# Patient Record
Sex: Male | Born: 1975 | Race: White | Hispanic: No | Smoking: Current every day smoker
Health system: Southern US, Community
[De-identification: ages and names within clinical notes are randomized; demographics above are authoritative.]

## PROBLEM LIST (undated history)

## (undated) DIAGNOSIS — I10 Essential (primary) hypertension: Secondary | ICD-10-CM

## (undated) DIAGNOSIS — F329 Major depressive disorder, single episode, unspecified: Secondary | ICD-10-CM

## (undated) DIAGNOSIS — F32A Depression, unspecified: Secondary | ICD-10-CM

## (undated) DIAGNOSIS — R4589 Other symptoms and signs involving emotional state: Secondary | ICD-10-CM

## (undated) DIAGNOSIS — R4689 Other symptoms and signs involving appearance and behavior: Secondary | ICD-10-CM

## (undated) HISTORY — DX: Essential (primary) hypertension: I10

---

## 2014-11-01 ENCOUNTER — Encounter (HOSPITAL_COMMUNITY): Payer: Self-pay | Admitting: *Deleted

## 2014-11-01 ENCOUNTER — Emergency Department (HOSPITAL_COMMUNITY)
Admission: EM | Admit: 2014-11-01 | Discharge: 2014-11-02 | Disposition: A | Discharge status: Other Acute Inpatient Hospital | Payer: BLUE CROSS/BLUE SHIELD | Attending: Emergency Medicine | Admitting: Emergency Medicine

## 2014-11-01 DIAGNOSIS — R45851 Suicidal ideations: Secondary | ICD-10-CM | POA: Insufficient documentation

## 2014-11-01 DIAGNOSIS — Z87891 Personal history of nicotine dependence: Secondary | ICD-10-CM | POA: Insufficient documentation

## 2014-11-01 DIAGNOSIS — R1011 Right upper quadrant pain: Secondary | ICD-10-CM | POA: Insufficient documentation

## 2014-11-01 DIAGNOSIS — F32A Depression, unspecified: Secondary | ICD-10-CM

## 2014-11-01 DIAGNOSIS — F329 Major depressive disorder, single episode, unspecified: Secondary | ICD-10-CM

## 2014-11-01 HISTORY — DX: Other symptoms and signs involving appearance and behavior: R46.89

## 2014-11-01 HISTORY — DX: Other symptoms and signs involving emotional state: R45.89

## 2014-11-01 HISTORY — DX: Depression, unspecified: F32.A

## 2014-11-01 HISTORY — DX: Major depressive disorder, single episode, unspecified: F32.9

## 2014-11-01 LAB — CBC WITH DIFFERENTIAL/PLATELET
BASOS PCT: 2 % — AB (ref 0–1)
Basophils Absolute: 0.1 10*3/uL (ref 0.0–0.1)
EOS ABS: 0.1 10*3/uL (ref 0.0–0.7)
Eosinophils Relative: 1 % (ref 0–5)
HCT: 46.9 % (ref 39.0–52.0)
HEMOGLOBIN: 16.4 g/dL (ref 13.0–17.0)
Lymphocytes Relative: 40 % (ref 12–46)
Lymphs Abs: 2.8 10*3/uL (ref 0.7–4.0)
MCH: 32.3 pg (ref 26.0–34.0)
MCHC: 35 g/dL (ref 30.0–36.0)
MCV: 92.5 fL (ref 78.0–100.0)
Monocytes Absolute: 0.6 10*3/uL (ref 0.1–1.0)
Monocytes Relative: 9 % (ref 3–12)
Neutro Abs: 3.4 10*3/uL (ref 1.7–7.7)
Neutrophils Relative %: 48 % (ref 43–77)
PLATELETS: 306 10*3/uL (ref 150–400)
RBC: 5.07 MIL/uL (ref 4.22–5.81)
RDW: 14.7 % (ref 11.5–15.5)
WBC: 7 10*3/uL (ref 4.0–10.5)

## 2014-11-01 LAB — URINALYSIS, ROUTINE W REFLEX MICROSCOPIC
Bilirubin Urine: NEGATIVE
Glucose, UA: NEGATIVE mg/dL
HGB URINE DIPSTICK: NEGATIVE
Ketones, ur: NEGATIVE mg/dL
Leukocytes, UA: NEGATIVE
NITRITE: NEGATIVE
Protein, ur: 30 mg/dL — AB
SPECIFIC GRAVITY, URINE: 1.016 (ref 1.005–1.030)
UROBILINOGEN UA: 0.2 mg/dL (ref 0.0–1.0)
pH: 7 (ref 5.0–8.0)

## 2014-11-01 LAB — LIPASE, BLOOD: LIPASE: 27 U/L (ref 11–59)

## 2014-11-01 LAB — COMPREHENSIVE METABOLIC PANEL
ALT: 128 U/L — ABNORMAL HIGH (ref 0–53)
ANION GAP: 12 (ref 5–15)
AST: 137 U/L — ABNORMAL HIGH (ref 0–37)
Albumin: 4.3 g/dL (ref 3.5–5.2)
Alkaline Phosphatase: 77 U/L (ref 39–117)
BILIRUBIN TOTAL: 0.4 mg/dL (ref 0.3–1.2)
BUN: <5 mg/dL — ABNORMAL LOW (ref 6–23)
CALCIUM: 8.7 mg/dL (ref 8.4–10.5)
CO2: 26 mmol/L (ref 19–32)
Chloride: 103 mEq/L (ref 96–112)
Creatinine, Ser: 0.89 mg/dL (ref 0.50–1.35)
Glucose, Bld: 99 mg/dL (ref 70–99)
POTASSIUM: 3.7 mmol/L (ref 3.5–5.1)
SODIUM: 141 mmol/L (ref 135–145)
Total Protein: 8 g/dL (ref 6.0–8.3)

## 2014-11-01 LAB — URINE MICROSCOPIC-ADD ON

## 2014-11-01 LAB — RAPID URINE DRUG SCREEN, HOSP PERFORMED
AMPHETAMINES: NOT DETECTED
BENZODIAZEPINES: NOT DETECTED
Barbiturates: NOT DETECTED
Cocaine: NOT DETECTED
OPIATES: NOT DETECTED
Tetrahydrocannabinol: POSITIVE — AB

## 2014-11-01 LAB — ETHANOL: ALCOHOL ETHYL (B): 244 mg/dL — AB (ref 0–9)

## 2014-11-01 LAB — ACETAMINOPHEN LEVEL

## 2014-11-01 LAB — SALICYLATE LEVEL: Salicylate Lvl: <4 mg/dL (ref 2.8–20.0)

## 2014-11-01 MED ORDER — ZOLPIDEM TARTRATE 5 MG PO TABS: 5.0000 mg | ORAL_TABLET | Freq: Every evening | ORAL | Status: DC | PRN

## 2014-11-01 MED ORDER — LORAZEPAM 1 MG PO TABS
1.0000 mg | ORAL_TABLET | Freq: Once | ORAL | Status: AC
  Administered 2014-11-01: 1 mg via ORAL
  Filled 2014-11-01: qty 1

## 2014-11-01 MED ORDER — MIRTAZAPINE 30 MG PO TABS
30.0000 mg | ORAL_TABLET | Freq: Every day | ORAL | Status: DC | PRN
  Filled 2014-11-01: qty 1

## 2014-11-01 MED ORDER — IBUPROFEN 200 MG PO TABS
600.0000 mg | ORAL_TABLET | Freq: Three times a day (TID) | ORAL | Status: DC | PRN
  Administered 2014-11-01 – 2014-11-02 (×2): 600 mg via ORAL
  Filled 2014-11-01 (×4): qty 1

## 2014-11-01 MED ORDER — GABAPENTIN 300 MG PO CAPS
300.0000 mg | ORAL_CAPSULE | Freq: Three times a day (TID) | ORAL | Status: DC
  Administered 2014-11-01 – 2014-11-02 (×2): 300 mg via ORAL
  Filled 2014-11-01 (×2): qty 1

## 2014-11-01 MED ORDER — VITAMIN B-1 100 MG PO TABS
100.0000 mg | ORAL_TABLET | Freq: Every day | ORAL | Status: DC
  Administered 2014-11-01 – 2014-11-02 (×2): 100 mg via ORAL
  Filled 2014-11-01 (×2): qty 1

## 2014-11-01 MED ORDER — THIAMINE HCL 100 MG/ML IJ SOLN: 100.0000 mg | Freq: Every day | INTRAMUSCULAR | Status: DC

## 2014-11-01 MED ORDER — DULOXETINE HCL 60 MG PO CPEP
60.0000 mg | ORAL_CAPSULE | Freq: Every day | ORAL | Status: DC
  Administered 2014-11-02: 60 mg via ORAL
  Filled 2014-11-01: qty 1

## 2014-11-01 MED ORDER — ONDANSETRON HCL 4 MG PO TABS: 4.0000 mg | ORAL_TABLET | Freq: Three times a day (TID) | ORAL | Status: DC | PRN

## 2014-11-01 MED ORDER — LORAZEPAM 1 MG PO TABS: 1.0000 mg | ORAL_TABLET | ORAL | Status: DC | PRN

## 2014-11-01 NOTE — ED Notes (Signed)
TTS at bedside. 

## 2014-11-01 NOTE — ED Notes (Addendum)
Battling with severe depression; suicidal thoughts: wants to overdose on pills, cut wrists. Feels terrible, hopeless, poor self image. Is being treated for depression. C/o lt lateral side of abd. And lower back pain. Etoh. Last drink on 10/31/14

## 2014-11-01 NOTE — BH Assessment (Addendum)
Tele Assessment Note   Alan Becker is an 39 y.o. male.  -Clinician talked to Campbell Lerner, PA regarding need of TTS consult.  Pt has SI and has been drinking daily since separation from wife 6 months ago.  Patient said that he does have thoughts of killing himself.  He said he has suffered with depression for years.  Patient has not attempted to kill himself before.  When asked about current SI he says he has been thinking of killing himself.  When asked how he said "I don't know, I could take a bottle full of pills."  He has prescription for cymbalta and gabapentin.  Patient has access to pills then.  Patient denies HI or A/V hallucinations.  He did admit to making a cut to his left arm three weeks ago but not in an effort to kill himself.  Patient said that he has been drinking 1/5 whiskey daily for the last two years.  Six months ago he and wife separated.  He has a 76 year old daughter he sees twice weekly.  Patient had contacted his parents about his depression and suicidal thoughts and [facility].  Patient has not had previous inpatient care.  He had a therapist 10 years ago he saw a few times.  He has no outpatient care now.    -Clinician talked to Donell Sievert, PA regarding patient care.  He recommended inpatient care for patient.  There are no beds at [facility] now, pt to be referred out.  He may be considered for placement as openings occur.  Trisha Mangle, PA was informed of disposition and is in agreement.  Axis I: Alcohol Abuse and Major Depression, Recurrent severe Axis II: Deferred Axis III:  Past Medical History  Diagnosis Date  . Depression   . Suicidal behavior    Axis IV: other psychosocial or environmental problems Axis V: 31-40 impairment in reality testing  Past Medical History:  Past Medical History  Diagnosis Date  . Depression   . Suicidal behavior     No past surgical history on file.  Family History: No family history on file.  Social History:   reports that he has quit smoking. He does not have any smokeless tobacco history on file. He reports that he drinks alcohol. He reports that he does not use illicit drugs.  Additional Social History:  Alcohol / Drug Use Pain Medications: None Prescriptions: Cymbalta & Gabapentin for the last two months. Over the Counter: None History of alcohol / drug use?: Yes Withdrawal Symptoms: Tremors, Cramps, Weakness, Patient aware of relationship between substance abuse and physical/medical complications, Sweats, Fever / Chills Substance #1 Name of Substance 1: ETOH (whiskey) 1 - Age of First Use: 39 years of age 31 - Amount (size/oz): 1/5 whiskey daily 1 - Frequency: Daily 1 - Duration: Last two years (at least) 1 - Last Use / Amount: Last night  CIWA: CIWA-Ar BP: (!) 161/117 mmHg Pulse Rate: 94 Nausea and Vomiting: no nausea and no vomiting Tactile Disturbances: none Tremor: not visible, but can be felt fingertip to fingertip Auditory Disturbances: not present Paroxysmal Sweats: no sweat visible Visual Disturbances: not present Anxiety: mildly anxious Headache, Fullness in Head: moderate Agitation: somewhat more than normal activity Orientation and Clouding of Sensorium: oriented and can do serial additions CIWA-Ar Total: 6 COWS:    PATIENT STRENGTHS: (choose at least two) Ability for insight Communication skills Supportive family/friends  Allergies: No Known Allergies  Home Medications:  (Not in a  hospital admission)  OB/GYN Status:  No LMP for male patient.  General Assessment Data Location of Assessment: [facility] ED Is this a Tele or Face-to-Face Assessment?: Tele Assessment Is this an Initial Assessment or a Re-assessment for this encounter?: Initial Assessment Living Arrangements: Alone Can pt return to current living arrangement?: Yes Admission Status: Voluntary Is patient capable of signing voluntary admission?: Yes Transfer from: Acute Hospital Referral Source:  Self/Family/Friend     [facility] Crisis Care Plan Living Arrangements: Alone Name of Psychiatrist: N/A Name of Therapist: N/A  Education Status Highest grade of school patient has completed: BA  Risk to self with the past 6 months Suicidal Ideation: Yes-Currently Present Suicidal Intent: Yes-Currently Present Is patient at risk for suicide?: Yes Suicidal Plan?: Yes-Currently Present Specify Current Suicidal Plan: Overdose on a whole bottle of pills Access to Means: Yes Specify Access to Suicidal Means: Meds in the home What has been your use of drugs/alcohol within the last 12 months?: ETOH use Previous Attempts/Gestures: No How many times?: 0 Other Self Harm Risks: Made cut to left arm about 3 weeks ago. Triggers for Past Attempts: None known Intentional Self Injurious Behavior: Cutting Comment - Self Injurious Behavior: One incident three weeks ago. Family Suicide History: No Recent stressful life event(s): Divorce (Separation from wife 6 months ago.) Persecutory voices/beliefs?: Yes Depression: Yes Depression Symptoms: Despondent, Insomnia, Tearfulness, Isolating, Guilt, Loss of interest in usual pleasures, Feeling worthless/self pity Substance abuse history and/or treatment for substance abuse?: Yes Suicide prevention information given to non-admitted patients: Not applicable  Risk to Others within the past 6 months Homicidal Ideation: No Thoughts of Harm to Others: No Current Homicidal Intent: No Current Homicidal Plan: No Access to Homicidal Means: No Identified Victim: No one History of harm to others?: No Assessment of Violence: None Noted Violent Behavior Description: Pt calm and cooperative Does patient have access to weapons?: No Criminal Charges Pending?: No Does patient have a court date: No  Psychosis Hallucinations: None noted Delusions: None noted  Mental Status Report Appear/Hygiene: Disheveled, In scrubs Eye Contact: Fair Motor Activity: Freedom of  movement, Unremarkable Speech: Logical/coherent Level of Consciousness: Alert Mood: Depressed, Despair, Helpless, Empty Affect: Blunted, Depressed, Sad Anxiety Level: None Thought Processes: Coherent, Relevant Judgement: Impaired Orientation: Person, Place, Time, Situation Obsessive Compulsive Thoughts/Behaviors: None  Cognitive Functioning Concentration: Decreased Memory: Recent Impaired, Remote Intact IQ: Average Insight: Fair Impulse Control: Poor Appetite: Fair Weight Loss: 15 Weight Gain: 0 Sleep: Decreased Total Hours of Sleep:  (<4H/D if not drinking.) Vegetative Symptoms: Staying in bed  ADLScreening [facility]) Patient's cognitive ability adequate to safely complete daily activities?: Yes Patient able to express need for assistance with ADLs?: Yes Independently performs ADLs?: Yes (appropriate for developmental age)  Prior Inpatient Therapy Prior Inpatient Therapy: No Prior Therapy Dates: N/A Prior Therapy Facilty/Provider(s): None Reason for Treatment: None  Prior Outpatient Therapy Prior Outpatient Therapy: Yes Prior Therapy Dates: 10 years ago Prior Therapy Facilty/Provider(s): Dr. Gaynell Face Reason for Treatment: depression  ADL Screening (condition at time of admission) Patient's cognitive ability adequate to safely complete daily activities?: Yes Is the patient deaf or have difficulty hearing?: No Does the patient have difficulty seeing, even when wearing glasses/contacts?: No (Wears glasses.) Does the patient have difficulty concentrating, remembering, or making decisions?: Yes Patient able to express need for assistance with ADLs?: Yes Does the patient have difficulty dressing or bathing?: No Independently performs ADLs?: Yes (appropriate for developmental age) Does the patient have difficulty walking or climbing stairs?: No Weakness of  Legs: Both Weakness of Arms/Hands: None       Abuse/Neglect Assessment (Assessment to be complete  while patient is alone) Physical Abuse: Denies Verbal Abuse: Denies Sexual Abuse: Denies Exploitation of patient/patient's resources: Denies Self-Neglect: Denies     Merchant navy officerAdvance Directives (For Healthcare) Does patient have an advance directive?: No Would patient like information on creating an advanced directive?: No - patient declined information    Additional Information 1:1 In Past 12 Months?: No CIRT Risk: No Elopement Risk: No Does patient have medical clearance?: Yes     Disposition:  Disposition Initial Assessment Completed for this Encounter: Yes Disposition of Patient: Inpatient treatment program, Referred to Type of inpatient treatment program: Adult Patient referred to: Other (Comment) (Pt meets inpatient care.)  Alexandria LodgeHarvey, Beautiful Pensyl Ray 11/01/2014 10:53 PM

## 2014-11-01 NOTE — ED Notes (Signed)
Staffing office called for sitter. 

## 2014-11-01 NOTE — ED Provider Notes (Signed)
CSN: [id number]     Arrival date & time 11/01/14  1835 History   First MD Initiated Contact with Patient 11/01/14 1929     Chief Complaint  Patient presents with  . Suicidal  . Abdominal Pain     (Consider location/radiation/quality/duration/timing/severity/associated sxs/prior Treatment) Patient is a 39 y.o. male presenting with abdominal pain and mental health disorder. The history is provided by the patient. No language interpreter was used.  Abdominal Pain Pain location:  LUQ Pain quality: aching   Pain radiates to:  Does not radiate Pain severity:  Moderate Onset quality:  Gradual Duration:  3 weeks Timing:  Intermittent Progression:  Waxing and waning Chronicity:  New Context: alcohol use   Relieved by:  Nothing Worsened by:  Nothing tried Ineffective treatments:  None tried Associated symptoms: no chest pain and no diarrhea   Risk factors: has not had multiple surgeries   Mental Health Problem Presenting symptoms: self mutilation, suicidal thoughts and suicide attempt   Degree of incapacity (severity):  Severe Onset quality:  Gradual Duration:  3 weeks Timing:  Constant Context: alcohol use   Relieved by:  Nothing Worsened by:  Nothing tried Ineffective treatments:  Antidepressants Associated symptoms: abdominal pain   Associated symptoms: no chest pain   Risk factors: hx of mental illness   Pt reports he cut his wrist 3 weeks ago in a suicide attempt.   No medical treatment sought at that time.  Pt reports alcohol withdrawal and severe depression.   Past Medical History  Diagnosis Date  . Depression   . Suicidal behavior    No past surgical history on file. No family history on file. History  Substance Use Topics  . Smoking status: Former Games developermoker  . Smokeless tobacco: Not on file  . Alcohol Use: Yes    Review of Systems  Cardiovascular: Negative for chest pain.  Gastrointestinal: Positive for abdominal pain. Negative for diarrhea.   Psychiatric/Behavioral: Positive for suicidal ideas and self-injury.  All other systems reviewed and are negative.     Allergies  Review of patient's allergies indicates no known allergies.  Home Medications   Prior to Admission medications   Not on File   BP 168/116 mmHg  Pulse 109  Temp(Src) 98.3 F (36.8 C) (Oral)  Resp 24  SpO2 98% Physical Exam  Constitutional: He is oriented to person, place, and time. He appears well-developed and well-nourished.  HENT:  Head: Normocephalic.  Right Ear: External ear normal.  Left Ear: External ear normal.  Nose: Nose normal.  Mouth/Throat: Oropharynx is clear and moist.  Eyes: Conjunctivae and EOM are normal. Pupils are equal, round, and reactive to light.  Neck: Normal range of motion.  Cardiovascular: Normal rate and normal heart sounds.   Pulmonary/Chest: Effort normal.  Abdominal: Soft. He exhibits no distension. There is no tenderness.  Musculoskeletal: Normal range of motion.  Neurological: He is alert and oriented to person, place, and time.  Skin: Skin is warm.  Psychiatric: He has a normal mood and affect.  depressed  Nursing note and vitals reviewed.   ED Course  Procedures (including critical care time) Labs Review Labs Reviewed  CBC WITH DIFFERENTIAL - Abnormal; Notable for the following:    Basophils Relative 2 (*)    All other components within normal limits  COMPREHENSIVE METABOLIC PANEL - Abnormal; Notable for the following:    BUN <5 (*)    AST 137 (*)    ALT 128 (*)    All other components within  normal limits  ETHANOL - Abnormal; Notable for the following:    Alcohol, Ethyl (B) 244 (*)    All other components within normal limits  LIPASE, BLOOD  URINE RAPID DRUG SCREEN (HOSP PERFORMED)  URINALYSIS, ROUTINE W REFLEX MICROSCOPIC  ACETAMINOPHEN LEVEL  SALICYLATE LEVEL    Imaging Review No results found.   EKG Interpretation None      MDM  No current abdominal pain.   I suspect pain is  second to elevated lft's and alcohol abuse.   Pt clear to have TTS evaluation   Final diagnoses:  Suicidal ideations  Depression  Right upper quadrant pain    TTS to consult.  Pt meets criteria for psych admission.   No beds available.  Bed should be available in the am.    Elson Areas, PA-C 11/01/14 [address] [place], PA-C 11/01/14 2333  Tilden Fossa, MD 11/01/14 [phone]

## 2014-11-02 MED ORDER — LORAZEPAM 1 MG PO TABS: 0.0000 mg | ORAL_TABLET | Freq: Two times a day (BID) | ORAL | Status: DC

## 2014-11-02 MED ORDER — CLONIDINE HCL 0.1 MG PO TABS
0.1000 mg | ORAL_TABLET | Freq: Once | ORAL | Status: AC
  Administered 2014-11-02: 0.1 mg via ORAL
  Filled 2014-11-02: qty 1

## 2014-11-02 MED ORDER — HYDROCHLOROTHIAZIDE 25 MG PO TABS
25.0000 mg | ORAL_TABLET | Freq: Once | ORAL | Status: AC
  Administered 2014-11-02: 25 mg via ORAL
  Filled 2014-11-02: qty 1

## 2014-11-02 MED ORDER — LORAZEPAM 1 MG PO TABS
0.0000 mg | ORAL_TABLET | Freq: Four times a day (QID) | ORAL | Status: DC
  Administered 2014-11-02: 1 mg via ORAL
  Administered 2014-11-02: 2 mg via ORAL
  Filled 2014-11-02: qty 2
  Filled 2014-11-02: qty 1

## 2014-11-02 NOTE — ED Notes (Signed)
All belongings sent with patient to [facility] via [facility]

## 2014-11-02 NOTE — Progress Notes (Signed)
[facility] has been [facility] To call report: [phone].  LCSW let RN know of disposition who will complete transport.  Deretha EmoryHannah Jacquelyn Antony LCSW, MSW Clinical Social Work: Emergency Room [phone]

## 2014-11-02 NOTE — ED Notes (Signed)
See downtime documentation for events between 0100 and 0500 hours

## 2014-11-02 NOTE — ED Notes (Signed)
Alan Becker at [facility] aware of pt vitals. Ok for transport

## 2014-11-02 NOTE — Progress Notes (Signed)
Patient being recommended for inpatient admission.  Faxed to: [facility] (will review, no beds currently)  Will follow up with referrals once reviewed.  Deretha EmoryHannah Evalie Hargraves LCSW, MSW Clinical Social Work: Emergency Room [phone]

## 2014-11-02 NOTE — ED Notes (Signed)
[facility] called to transport 

## 2014-11-02 NOTE — ED Notes (Signed)
Spoke with [facility] about pt bp continuing to be elevated, orders recieved

## 2014-11-02 NOTE — ED Notes (Signed)
Pt up to phone to call his mother. He is aware of transfer to [facility]. agreeable

## 2014-11-02 NOTE — ED Provider Notes (Signed)
8:07 AM Patient in no distress. However, the patient has sustained hypertension. No meds prescribed thus far. Patient will receive hydrochlorothiazide, be reassessed later.   Gerhard Munchobert Terrea Bruster, MD 11/02/14 [phone]

## 2015-01-04 ENCOUNTER — Telehealth: Payer: Self-pay

## 2015-01-04 NOTE — Telephone Encounter (Signed)
Pt contacted office and needed a PCP. Dr. Yetta BarreJones agreed to take on pt. Pt has appt set for next week.

## 2015-01-12 ENCOUNTER — Encounter: Payer: Self-pay | Admitting: Internal Medicine

## 2015-01-12 ENCOUNTER — Telehealth: Payer: Self-pay | Admitting: Internal Medicine

## 2015-01-12 ENCOUNTER — Ambulatory Visit (INDEPENDENT_AMBULATORY_CARE_PROVIDER_SITE_OTHER): Payer: BLUE CROSS/BLUE SHIELD | Admitting: Internal Medicine

## 2015-01-12 VITALS — BP 138/90 | HR 96 | Temp 98.5°F | Resp 16 | Ht 69.0 in | Wt 221.0 lb

## 2015-01-12 DIAGNOSIS — F418 Other specified anxiety disorders: Secondary | ICD-10-CM

## 2015-01-12 DIAGNOSIS — I10 Essential (primary) hypertension: Secondary | ICD-10-CM | POA: Diagnosis not present

## 2015-01-12 DIAGNOSIS — Z23 Encounter for immunization: Secondary | ICD-10-CM | POA: Diagnosis not present

## 2015-01-12 MED ORDER — HYDROCHLOROTHIAZIDE 25 MG PO TABS: 25.0000 mg | ORAL_TABLET | Freq: Every day | ORAL | Status: DC

## 2015-01-12 MED ORDER — MIRTAZAPINE 30 MG PO TABS: 30.0000 mg | ORAL_TABLET | Freq: Every day | ORAL | Status: DC

## 2015-01-12 MED ORDER — GABAPENTIN 300 MG PO CAPS: 300.0000 mg | ORAL_CAPSULE | Freq: Three times a day (TID) | ORAL | Status: DC

## 2015-01-12 NOTE — Progress Notes (Signed)
   Subjective:    Patient ID: Alan Becker, male    DOB: 05/01/1976, 39 y.o.   MRN: [id number]  Hypertension This is a chronic problem. The current episode started more than 1 year ago. The problem is unchanged. The problem is controlled. Associated symptoms include anxiety. Pertinent negatives include no blurred vision, chest pain, headaches, malaise/fatigue, neck pain, orthopnea, palpitations, peripheral edema, PND, shortness of breath or sweats. There are no associated agents to hypertension. Past treatments include diuretics. The current treatment provides mild improvement. Compliance problems include exercise and diet.       Review of Systems  Constitutional: Negative.  Negative for fever, chills, malaise/fatigue, diaphoresis, appetite change and fatigue.  HENT: Negative.   Eyes: Negative.  Negative for blurred vision.  Respiratory: Negative.  Negative for cough, choking, chest tightness, shortness of breath and stridor.   Cardiovascular: Negative.  Negative for chest pain, palpitations, orthopnea, leg swelling and PND.  Gastrointestinal: Negative.  Negative for nausea, vomiting, abdominal pain, diarrhea, constipation and blood in stool.  Endocrine: Negative.   Genitourinary: Negative.  Negative for difficulty urinating.  Musculoskeletal: Negative.  Negative for back pain, arthralgias and neck pain.  Skin: Negative.   Allergic/Immunologic: Negative.   Neurological: Negative.  Negative for dizziness and headaches.  Hematological: Negative.  Negative for adenopathy. Does not bruise/bleed easily.  Psychiatric/Behavioral: Positive for sleep disturbance and dysphoric mood. Negative for suicidal ideas, hallucinations, behavioral problems, confusion, self-injury, decreased concentration and agitation. The patient is nervous/anxious. The patient is not hyperactive.        Objective:   Physical Exam  Constitutional: He is oriented to person, place, and time. He appears well-developed and  well-nourished. No distress.  HENT:  Head: Normocephalic and atraumatic.  Mouth/Throat: Oropharynx is clear and moist. No oropharyngeal exudate.  Eyes: Conjunctivae are normal. Right eye exhibits no discharge. Left eye exhibits no discharge. No scleral icterus.  Neck: Normal range of motion. Neck supple. No JVD present. No tracheal deviation present. No thyromegaly present.  Cardiovascular: Normal rate, regular rhythm, normal heart sounds and intact distal pulses.  Exam reveals no gallop and no friction rub.   No murmur heard. Pulmonary/Chest: Effort normal and breath sounds normal. No stridor. No respiratory distress. He has no wheezes. He has no rales. He exhibits no tenderness.  Abdominal: Soft. Bowel sounds are normal. He exhibits no distension and no mass. There is no tenderness. There is no rebound and no guarding.  Musculoskeletal: Normal range of motion. He exhibits no edema or tenderness.  Lymphadenopathy:    He has no cervical adenopathy.  Neurological: He is oriented to person, place, and time.  Skin: Skin is warm and dry. No rash noted. He is not diaphoretic. No erythema. No pallor.  Psychiatric: He has a normal mood and affect. His behavior is normal. Judgment and thought content normal.  Vitals reviewed.   Lab Results  Component Value Date   WBC 7.0 11/01/2014   HGB 16.4 11/01/2014   HCT 46.9 11/01/2014   PLT 306 11/01/2014   GLUCOSE 99 11/01/2014   ALT 128* 11/01/2014   AST 137* 11/01/2014   NA 141 11/01/2014   K 3.7 11/01/2014   CL 103 11/01/2014   CREATININE 0.89 11/01/2014   BUN <5* 11/01/2014   CO2 26 11/01/2014        Assessment & Plan:

## 2015-01-12 NOTE — Progress Notes (Signed)
Pre visit review using our clinic review tool, if applicable. No additional management support is needed unless otherwise documented below in the visit note. 

## 2015-01-12 NOTE — Telephone Encounter (Signed)
emmi mailed  °

## 2015-01-12 NOTE — Patient Instructions (Signed)

## 2015-01-13 NOTE — Assessment & Plan Note (Signed)
His BP is adequately well controlled Recent lytes and renal function were normal He will work on his lifestyle modifications

## 2015-01-13 NOTE — Assessment & Plan Note (Addendum)
His symptoms are well controlled Will cont remeron and neurontin

## 2015-09-19 ENCOUNTER — Telehealth: Payer: Self-pay | Admitting: *Deleted

## 2015-09-19 DIAGNOSIS — I10 Essential (primary) hypertension: Secondary | ICD-10-CM

## 2015-09-19 MED ORDER — HYDROCHLOROTHIAZIDE 25 MG PO TABS: 25.0000 mg | ORAL_TABLET | Freq: Every day | ORAL | Status: DC

## 2015-09-19 NOTE — Telephone Encounter (Signed)
Received call pt states he has change pharmacy needing rx for HCTZ sent to [facility] [address]. Inform pt will send electronically...Raechel Chute/lmb

## 2016-03-01 ENCOUNTER — Telehealth: Payer: Self-pay | Admitting: Internal Medicine

## 2016-03-01 ENCOUNTER — Other Ambulatory Visit: Payer: Self-pay | Admitting: Internal Medicine

## 2016-03-01 NOTE — Telephone Encounter (Signed)
Left patient vm to call back to schedule CPE to continue medications.

## 2016-03-04 NOTE — Telephone Encounter (Signed)
Noted thanks °

## 2018-04-21 ENCOUNTER — Other Ambulatory Visit (INDEPENDENT_AMBULATORY_CARE_PROVIDER_SITE_OTHER): Payer: Self-pay

## 2018-04-21 ENCOUNTER — Ambulatory Visit (INDEPENDENT_AMBULATORY_CARE_PROVIDER_SITE_OTHER): Payer: Self-pay | Admitting: Internal Medicine

## 2018-04-21 ENCOUNTER — Encounter: Payer: Self-pay | Admitting: Internal Medicine

## 2018-04-21 VITALS — BP 160/80 | HR 112 | Temp 98.4°F | Resp 16 | Ht 69.0 in | Wt 181.2 lb

## 2018-04-21 DIAGNOSIS — Z0000 Encounter for general adult medical examination without abnormal findings: Secondary | ICD-10-CM | POA: Insufficient documentation

## 2018-04-21 DIAGNOSIS — F1020 Alcohol dependence, uncomplicated: Secondary | ICD-10-CM

## 2018-04-21 DIAGNOSIS — E559 Vitamin D deficiency, unspecified: Secondary | ICD-10-CM

## 2018-04-21 DIAGNOSIS — I10 Essential (primary) hypertension: Secondary | ICD-10-CM

## 2018-04-21 DIAGNOSIS — K921 Melena: Secondary | ICD-10-CM

## 2018-04-21 LAB — COMPREHENSIVE METABOLIC PANEL
ALT: 107 U/L — ABNORMAL HIGH (ref 0–53)
AST: 79 U/L — AB (ref 0–37)
Albumin: 4.9 g/dL (ref 3.5–5.2)
Alkaline Phosphatase: 71 U/L (ref 39–117)
BUN: 8 mg/dL (ref 6–23)
CALCIUM: 9.6 mg/dL (ref 8.4–10.5)
CO2: 26 mEq/L (ref 19–32)
CREATININE: 0.73 mg/dL (ref 0.40–1.50)
Chloride: 100 mEq/L (ref 96–112)
GFR: 124.69 mL/min (ref >60.00)
Glucose, Bld: 98 mg/dL (ref 70–99)
POTASSIUM: 3.6 meq/L (ref 3.5–5.1)
Sodium: 139 mEq/L (ref 135–145)
Total Bilirubin: 0.6 mg/dL (ref 0.2–1.2)
Total Protein: 8.2 g/dL (ref 6.0–8.3)

## 2018-04-21 LAB — URINALYSIS, ROUTINE W REFLEX MICROSCOPIC
Hgb urine dipstick: NEGATIVE
KETONES UR: NEGATIVE
LEUKOCYTES UA: NEGATIVE
Nitrite: NEGATIVE
PH: 6.5 (ref 5.0–8.0)
RBC / HPF: NONE SEEN (ref 0–?)
Specific Gravity, Urine: 1.02 (ref 1.000–1.030)
URINE GLUCOSE: NEGATIVE
Urobilinogen, UA: 0.2 (ref 0.0–1.0)

## 2018-04-21 LAB — CBC WITH DIFFERENTIAL/PLATELET
BASOS PCT: 0.7 % (ref 0.0–3.0)
Basophils Absolute: 0 10*3/uL (ref 0.0–0.1)
EOS PCT: 0.6 % (ref 0.0–5.0)
Eosinophils Absolute: 0 10*3/uL (ref 0.0–0.7)
HCT: 48.6 % (ref 39.0–52.0)
Hemoglobin: 17 g/dL (ref 13.0–17.0)
Lymphocytes Relative: 22.4 % (ref 12.0–46.0)
Lymphs Abs: 1.5 10*3/uL (ref 0.7–4.0)
MCHC: 34.9 g/dL (ref 30.0–36.0)
MCV: 99.1 fl (ref 78.0–100.0)
MONO ABS: 1 10*3/uL (ref 0.1–1.0)
Monocytes Relative: 15.4 % — ABNORMAL HIGH (ref 3.0–12.0)
Neutro Abs: 4 10*3/uL (ref 1.4–7.7)
Neutrophils Relative %: 60.9 % (ref 43.0–77.0)
Platelets: 340 10*3/uL (ref 150.0–400.0)
RBC: 4.9 Mil/uL (ref 4.22–5.81)
RDW: 13.7 % (ref 11.5–15.5)
WBC: 6.5 10*3/uL (ref 4.0–10.5)

## 2018-04-21 LAB — LIPID PANEL
CHOLESTEROL: 261 mg/dL — AB (ref 0–200)
HDL: 84.3 mg/dL (ref >39.00)
LDL Cholesterol: 165 mg/dL — ABNORMAL HIGH (ref 0–99)
NonHDL: 176.53
Total CHOL/HDL Ratio: 3
Triglycerides: 59 mg/dL (ref 0.0–149.0)
VLDL: 11.8 mg/dL (ref 0.0–40.0)

## 2018-04-21 LAB — PROTIME-INR
INR: 1 ratio (ref 0.8–1.0)
PROTHROMBIN TIME: 11.4 s (ref 9.6–13.1)

## 2018-04-21 LAB — VITAMIN D 25 HYDROXY (VIT D DEFICIENCY, FRACTURES): VITD: 27.95 ng/mL — AB (ref 30.00–100.00)

## 2018-04-21 MED ORDER — CLONIDINE HCL 0.1 MG/24HR TD PTWK: 0.1000 mg | MEDICATED_PATCH | TRANSDERMAL | 0 refills | Status: DC

## 2018-04-21 MED ORDER — AMLODIPINE BESYLATE 5 MG PO TABS
5.0000 mg | ORAL_TABLET | Freq: Every day | ORAL | 0 refills | Status: DC
Start: 2018-04-21 — End: 2018-07-07

## 2018-04-21 MED ORDER — CHOLECALCIFEROL 50 MCG (2000 UT) PO TABS: 1.0000 | ORAL_TABLET | Freq: Every day | ORAL | 1 refills | Status: DC

## 2018-04-21 NOTE — Progress Notes (Signed)
Subjective:  Patient ID: Alan Becker, male    DOB: 11/26/1975  Age: 42 y.o. MRN: [id number]  CC: Hypertension and Annual Exam   HPI Alan Becker presents for a CPX.  He is concerned about his blood pressure.  He tells me a few weeks ago he checked his BP at home and it was 179/117.  He was subsequently seen at an urgent care center and started on amlodipine which has helped modestly.  He and his fiance are both concerned about his alcohol intake.  Until 6 weeks ago he was consuming 1/5 of liquor every day, daily, for several years.  About 6 weeks ago he decided to change to wine and is now drinking a bottle of wine every day.  He is committed to trying to abstain from alcohol use.  He also smokes cigarettes and has developed a chronic morning cough that is rarely productive of yellow phlegm.  He denies chest pain, hemoptysis, shortness of breath, wheezing, weight loss, fever, chills, or night sweats.  He tells me his for years he has noticed intermittent episodes of blood in his stool.  He denies abdominal pain, tenesmus, diarrhea, or constipation.  Outpatient Medications Prior to Visit  Medication Sig Dispense Refill  . gabapentin (NEURONTIN) 300 MG capsule Take 1 capsule (300 mg total) by mouth 3 (three) times daily. 90 capsule 5  . hydrochlorothiazide (HYDRODIURIL) 25 MG tablet TAKE 1 TABLET BY MOUTH DAILY 30 tablet 0  . mirtazapine (REMERON) 30 MG tablet Take 1 tablet (30 mg total) by mouth at bedtime. 90 tablet 3   No facility-administered medications prior to visit.     ROS Review of Systems  Constitutional: Positive for unexpected weight change (some recent wt gain). Negative for appetite change, chills, diaphoresis and fatigue.  HENT: Negative for trouble swallowing.   Eyes: Negative.   Respiratory: Positive for cough and shortness of breath. Negative for chest tightness and wheezing.   Cardiovascular: Negative for chest pain, palpitations and leg swelling.    Gastrointestinal: Positive for blood in stool. Negative for abdominal distention, abdominal pain, diarrhea, nausea and vomiting.  Endocrine: Negative.   Genitourinary: Negative.  Negative for difficulty urinating, dysuria, hematuria, penile swelling, scrotal swelling, testicular pain and urgency.  Musculoskeletal: Negative.   Skin: Negative.   Allergic/Immunologic: Negative.   Neurological: Negative.  Negative for dizziness, weakness, light-headedness and headaches.  Hematological: Negative for adenopathy. Does not bruise/bleed easily.  Psychiatric/Behavioral: Positive for decreased concentration, dysphoric mood and sleep disturbance. Negative for agitation, behavioral problems, confusion, hallucinations, self-injury and suicidal ideas. The patient is nervous/anxious. The patient is not hyperactive.     Objective:  BP (!) 160/80 (BP Location: Right Arm, Patient Position: Sitting, Cuff Size: Normal)   Pulse (!) 112   Temp 98.4 F (36.9 C) (Oral)   Resp 16   Ht 5\' 9"  (1.753 m)   Wt 181 lb 4 oz (82.2 kg)   SpO2 98%   BMI 26.77 kg/m   BP Readings from Last 3 Encounters:  04/21/18 (!) 160/80  01/12/15 138/90  11/02/14 (!) 158/112    Wt Readings from Last 3 Encounters:  04/21/18 181 lb 4 oz (82.2 kg)  01/12/15 221 lb (100.2 kg)    Physical Exam  Constitutional: He is oriented to person, place, and time.  Non-toxic appearance. He does not have a sickly appearance. He appears ill (tremulous). No distress.  HENT:  Mouth/Throat: Oropharynx is clear and moist. No oropharyngeal exudate.  Eyes: Conjunctivae are normal. No scleral icterus.  Neck: Normal range of motion. Neck supple. No JVD present. No thyromegaly present.  Cardiovascular: Normal rate, regular rhythm and normal heart sounds. Exam reveals no friction rub.  No murmur heard. EKG ---  Sinus  Rhythm  WITHIN NORMAL LIMITS   Pulmonary/Chest: Effort normal and breath sounds normal. No respiratory distress. He has no  wheezes. He has no rales.  Abdominal: Soft. Normal appearance and bowel sounds are normal. He exhibits no mass. There is no hepatosplenomegaly. There is no tenderness. No hernia.  Musculoskeletal: Normal range of motion. He exhibits no edema or deformity.  Lymphadenopathy:    He has no cervical adenopathy.  Neurological: He is alert and oriented to person, place, and time.  Skin: Skin is warm and dry. No rash noted. He is not diaphoretic.  Psychiatric: His behavior is normal. Judgment and thought content normal. His mood appears anxious. His affect is not angry and not inappropriate. His speech is not rapid and/or pressured, not delayed, not tangential and not slurred. He is not actively hallucinating. Cognition and memory are normal. He does not exhibit a depressed mood. He expresses no homicidal and no suicidal ideation. He is communicative. He is attentive.  Vitals reviewed.   Lab Results  Component Value Date   WBC 6.5 04/21/2018   HGB 17.0 04/21/2018   HCT 48.6 04/21/2018   PLT 340.0 04/21/2018   GLUCOSE 98 04/21/2018   CHOL 261 (H) 04/21/2018   TRIG 59.0 04/21/2018   HDL 84.30 04/21/2018   LDLCALC 165 (H) 04/21/2018   ALT 107 (H) 04/21/2018   AST 79 (H) 04/21/2018   NA 139 04/21/2018   K 3.6 04/21/2018   CL 100 04/21/2018   CREATININE 0.73 04/21/2018   BUN 8 04/21/2018   CO2 26 04/21/2018   INR 1.0 04/21/2018    No results found.  Assessment & Plan:   Alan Becker was seen today for hypertension and annual exam.  Diagnoses and all orders for this visit:  Essential hypertension- His blood pressure is not adequately well controlled.  I think this is mostly related to the alcohol intake so I have asked him to start using a clonidine patch in addition to the calcium channel blocker.  I will treat the low vitamin D level.  I will screen him for secondary causes and endorgan damage with lab work.  His EKG today is negative for LVH or ischemia. -     Comprehensive metabolic panel;  Future -     CBC with Differential/Platelet; Future -     Thyroid Panel With TSH; Future -     Urinalysis, Routine w reflex microscopic; Future -     Urine drugs of abuse scrn w alc, routine (Ref Lab); Future -     VITAMIN D 25 Hydroxy (Vit-D Deficiency, Fractures); Future -     EKG 12-Lead -     cloNIDine (CATAPRES - DOSED IN MG/24 HR) 0.1 mg/24hr patch; Place 1 patch (0.1 mg total) onto the skin once a week. -     amLODipine (NORVASC) 5 MG tablet; Take 1 tablet (5 mg total) by mouth daily.  Alcohol use disorder, severe, dependence (HCC)- His lab work shows evidence of alcoholic liver disease.  I will check his thiamine level.  We discussed treatment options today including an inpatient detox, outpatient program, or AA.  He is not willing to do an inpatient detox or outpatient program but he and his fiance are willing to attend AA and Al-Anon meetings. -  Comprehensive metabolic panel; Future -     CBC with Differential/Platelet; Future -     Protime-INR; Future -     Vitamin B1; Future  Routine general medical examination at a health care facility- Exam completed, labs reviewed- he has an elevated LDL but his ASCVD risk score is low so I do not recommend that he take a statin for CV risk reduction, vaccines reviewed, patient education material was given. -     Lipid panel; Future  Blood in stool, frank- This has been a chronic issue for him and he is not anemic.  He deferred on a rectal exam today.  I will encourage him to allow for rectal examination during his next visit but at this time this appears to be a benign concern, most likely hemorrhoidal bleeding.  Vitamin D insufficiency -     Cholecalciferol 2000 units TABS; Take 1 tablet (2,000 Units total) by mouth daily.   I have discontinued Feliz Beamravis Freehling's mirtazapine, gabapentin, and hydrochlorothiazide. I am also having him start on cloNIDine, amLODipine, and Cholecalciferol.  Meds ordered this encounter  Medications  .  cloNIDine (CATAPRES - DOSED IN MG/24 HR) 0.1 mg/24hr patch    Sig: Place 1 patch (0.1 mg total) onto the skin once a week.    Dispense:  12 patch    Refill:  0  . amLODipine (NORVASC) 5 MG tablet    Sig: Take 1 tablet (5 mg total) by mouth daily.    Dispense:  90 tablet    Refill:  0  . Cholecalciferol 2000 units TABS    Sig: Take 1 tablet (2,000 Units total) by mouth daily.    Dispense:  90 tablet    Refill:  1     Follow-up: Return in about 1 month (around 05/19/2018).  Sanda Lingerhomas Sho Salguero, MD

## 2018-04-21 NOTE — Patient Instructions (Signed)

## 2018-04-22 ENCOUNTER — Encounter: Payer: Self-pay | Admitting: Internal Medicine

## 2018-04-24 ENCOUNTER — Encounter: Payer: Self-pay | Admitting: Internal Medicine

## 2018-04-25 LAB — VITAMIN B1

## 2018-04-25 LAB — URINE DRUGS OF ABUSE SCREEN W ALC, ROUTINE (REF LAB)
ALCOHOL, ETHYL (U): POSITIVE — AB
AMPHETAMINES (1000 ng/mL SCRN): NEGATIVE
BARBITURATES: NEGATIVE
BENZODIAZEPINES: NEGATIVE
COCAINE METABOLITES: NEGATIVE
MARIJUANA MET (50 ng/mL SCRN): NEGATIVE
METHADONE: NEGATIVE
METHAQUALONE: NEGATIVE
OPIATES: NEGATIVE
PHENCYCLIDINE: NEGATIVE
PROPOXYPHENE: NEGATIVE

## 2018-04-25 LAB — THYROID PANEL WITH TSH
Free Thyroxine Index: 2.2 (ref 1.4–3.8)
T3 UPTAKE: 29 % (ref 22–35)
T4, Total: 7.5 ug/dL (ref 4.9–10.5)
TSH: 1.48 mIU/L (ref 0.40–4.50)

## 2018-04-26 ENCOUNTER — Encounter: Payer: Self-pay | Admitting: Internal Medicine

## 2018-04-26 ENCOUNTER — Other Ambulatory Visit: Payer: Self-pay | Admitting: Internal Medicine

## 2018-04-26 DIAGNOSIS — E519 Thiamine deficiency, unspecified: Secondary | ICD-10-CM | POA: Insufficient documentation

## 2018-04-26 MED ORDER — VITAMIN B-1 100 MG PO TABS: 100.0000 mg | ORAL_TABLET | Freq: Every day | ORAL | 1 refills | Status: DC

## 2018-04-28 ENCOUNTER — Encounter: Payer: Self-pay | Admitting: Internal Medicine

## 2018-04-28 DIAGNOSIS — I10 Essential (primary) hypertension: Secondary | ICD-10-CM

## 2018-04-28 DIAGNOSIS — E519 Thiamine deficiency, unspecified: Secondary | ICD-10-CM

## 2018-04-28 MED ORDER — VITAMIN B-1 100 MG PO TABS: 100.0000 mg | ORAL_TABLET | Freq: Every day | ORAL | 1 refills | Status: DC

## 2018-04-28 MED ORDER — CLONIDINE HCL 0.1 MG/24HR TD PTWK: 0.1000 mg | MEDICATED_PATCH | TRANSDERMAL | 0 refills | Status: DC

## 2018-05-12 ENCOUNTER — Encounter: Payer: Self-pay | Admitting: Internal Medicine

## 2018-05-13 ENCOUNTER — Other Ambulatory Visit: Payer: Self-pay | Admitting: Internal Medicine

## 2018-05-13 DIAGNOSIS — F418 Other specified anxiety disorders: Secondary | ICD-10-CM

## 2018-05-13 MED ORDER — TRAZODONE HCL 150 MG PO TABS: 150.0000 mg | ORAL_TABLET | Freq: Every day | ORAL | 0 refills | Status: DC

## 2018-05-26 ENCOUNTER — Ambulatory Visit: Payer: Self-pay | Admitting: Internal Medicine

## 2018-06-03 ENCOUNTER — Encounter: Payer: Self-pay | Admitting: Internal Medicine

## 2018-06-03 ENCOUNTER — Ambulatory Visit (INDEPENDENT_AMBULATORY_CARE_PROVIDER_SITE_OTHER): Payer: Self-pay | Admitting: Internal Medicine

## 2018-06-03 VITALS — BP 132/86 | HR 77 | Temp 97.8°F | Resp 16 | Ht 69.0 in | Wt 180.0 lb

## 2018-06-03 DIAGNOSIS — I10 Essential (primary) hypertension: Secondary | ICD-10-CM

## 2018-06-03 DIAGNOSIS — F1020 Alcohol dependence, uncomplicated: Secondary | ICD-10-CM

## 2018-06-03 DIAGNOSIS — N522 Drug-induced erectile dysfunction: Secondary | ICD-10-CM | POA: Insufficient documentation

## 2018-06-03 MED ORDER — AVANAFIL 100 MG PO TABS: 1.0000 | ORAL_TABLET | Freq: Every day | ORAL | 0 refills | Status: DC | PRN

## 2018-06-03 NOTE — Progress Notes (Signed)
Subjective:  Patient ID: Alan Becker, male    DOB: 05/05/1976  Age: 42 y.o. MRN: [id number]  CC: Hypertension   HPI Alan Becker presents for a BP check - He feels well today and offers no complaints.  He tells me his blood pressure has been well controlled since he significantly decreased his drinking.  He is trying to abstain from alcohol intake but he tells me that about 3 or 4 days ago, on a Saturday night, he drank an entire bottle of wine.  He said he felt poorly the next day but his blood pressure remained normal. He is not using the clonidine patch. He denies any recent episodes of headache, blurred vision, chest pain, or shortness of breath.  Outpatient Medications Prior to Visit  Medication Sig Dispense Refill  . amLODipine (NORVASC) 5 MG tablet Take 1 tablet (5 mg total) by mouth daily. 90 tablet 0  . Cholecalciferol 2000 units TABS Take 1 tablet (2,000 Units total) by mouth daily. 90 tablet 1  . thiamine (VITAMIN B-1) 100 MG tablet Take 1 tablet (100 mg total) by mouth daily. 90 tablet 1  . traZODone (DESYREL) 150 MG tablet Take 1 tablet (150 mg total) by mouth at bedtime. 90 tablet 0  . cloNIDine (CATAPRES - DOSED IN MG/24 HR) 0.1 mg/24hr patch Place 1 patch (0.1 mg total) onto the skin once a week. (Patient not taking: Reported on 06/03/2018) 12 patch 0   No facility-administered medications prior to visit.     ROS Review of Systems  Constitutional: Negative for diaphoresis and fatigue.  HENT: Negative.   Eyes: Negative for visual disturbance.  Respiratory: Negative for cough, chest tightness, shortness of breath and wheezing.   Cardiovascular: Negative for chest pain, palpitations and leg swelling.  Gastrointestinal: Negative for abdominal pain, constipation, diarrhea, nausea and vomiting.  Endocrine: Negative.   Genitourinary: Negative.  Negative for difficulty urinating.       + ED,  Difficulty maintaining an erection  Musculoskeletal: Negative.  Negative for  arthralgias and myalgias.  Skin: Negative.  Negative for rash.  Neurological: Negative for dizziness, weakness and headaches.  Hematological: Negative for adenopathy. Does not bruise/bleed easily.  Psychiatric/Behavioral: Positive for sleep disturbance. Negative for confusion, decreased concentration, dysphoric mood, self-injury and suicidal ideas. The patient is nervous/anxious.     Objective:  BP 132/86 (BP Location: Left Arm, Patient Position: Sitting, Cuff Size: Normal)   Pulse 77   Temp 97.8 F (36.6 C) (Oral)   Resp 16   Ht 5\' 9"  (1.753 m)   Wt 180 lb (81.6 kg)   SpO2 97%   BMI 26.58 kg/m   BP Readings from Last 3 Encounters:  06/03/18 132/86  04/21/18 (!) 160/80  01/12/15 138/90    Wt Readings from Last 3 Encounters:  06/03/18 180 lb (81.6 kg)  04/21/18 181 lb 4 oz (82.2 kg)  01/12/15 221 lb (100.2 kg)    Physical Exam  Constitutional: He is oriented to person, place, and time. No distress.  HENT:  Mouth/Throat: Oropharynx is clear and moist. No oropharyngeal exudate.  Eyes: Conjunctivae are normal. No scleral icterus.  Neck: Normal range of motion. Neck supple. No JVD present. No thyromegaly present.  Cardiovascular: Normal rate, regular rhythm and normal heart sounds. Exam reveals no gallop and no friction rub.  No murmur heard. Pulmonary/Chest: Effort normal and breath sounds normal. He has no wheezes. He has no rales.  Abdominal: Soft. Normal appearance and bowel sounds are normal. He exhibits no mass.  There is no hepatosplenomegaly. There is no tenderness.  Musculoskeletal: Normal range of motion. He exhibits no edema, tenderness or deformity.  Lymphadenopathy:    He has no cervical adenopathy.  Neurological: He is alert and oriented to person, place, and time.  Skin: Skin is warm and dry. He is not diaphoretic.  Psychiatric: He has a normal mood and affect. His behavior is normal. Judgment and thought content normal.  Vitals reviewed.   Lab Results    Component Value Date   WBC 6.5 04/21/2018   HGB 17.0 04/21/2018   HCT 48.6 04/21/2018   PLT 340.0 04/21/2018   GLUCOSE 98 04/21/2018   CHOL 261 (H) 04/21/2018   TRIG 59.0 04/21/2018   HDL 84.30 04/21/2018   LDLCALC 165 (H) 04/21/2018   ALT 107 (H) 04/21/2018   AST 79 (H) 04/21/2018   NA 139 04/21/2018   K 3.6 04/21/2018   CL 100 04/21/2018   CREATININE 0.73 04/21/2018   BUN 8 04/21/2018   CO2 26 04/21/2018   TSH 1.48 04/21/2018   INR 1.0 04/21/2018    No results found.  Assessment & Plan:   Alan Becker was seen today for hypertension.  Diagnoses and all orders for this visit:  Essential hypertension- His BP is well controlled   Alcohol use disorder, severe, dependence (HCC)- I encouraged him to try to abstain from EtOH intake and to start attending AA meetings  Drug-induced erectile dysfunction -     Avanafil (STENDRA) 100 MG TABS; Take 1 tablet by mouth daily as needed.   I have discontinued Lamount Couey's cloNIDine. I am also having him start on Avanafil. Additionally, I am having him maintain his amLODipine, Cholecalciferol, thiamine, and traZODone.  Meds ordered this encounter  Medications  . Avanafil (STENDRA) 100 MG TABS    Sig: Take 1 tablet by mouth daily as needed.    Dispense:  1 tablet    Refill:  0     Follow-up: Return in about 6 months (around 12/04/2018).  Sanda Lingerhomas Ransome Helwig, MD

## 2018-06-03 NOTE — Patient Instructions (Signed)

## 2018-07-07 ENCOUNTER — Encounter: Payer: Self-pay | Admitting: Internal Medicine

## 2018-07-07 DIAGNOSIS — I10 Essential (primary) hypertension: Secondary | ICD-10-CM

## 2018-07-07 MED ORDER — AMLODIPINE BESYLATE 5 MG PO TABS: 5.0000 mg | ORAL_TABLET | Freq: Every day | ORAL | 0 refills | Status: DC

## 2018-10-05 ENCOUNTER — Ambulatory Visit: Payer: Self-pay | Admitting: Internal Medicine

## 2018-10-05 DIAGNOSIS — Z0289 Encounter for other administrative examinations: Secondary | ICD-10-CM

## 2018-10-31 ENCOUNTER — Encounter: Payer: Self-pay | Admitting: Internal Medicine

## 2018-11-02 ENCOUNTER — Encounter: Payer: Self-pay | Admitting: Internal Medicine

## 2018-11-02 DIAGNOSIS — I10 Essential (primary) hypertension: Secondary | ICD-10-CM

## 2018-11-02 MED ORDER — AMLODIPINE BESYLATE 5 MG PO TABS: 5.0000 mg | ORAL_TABLET | Freq: Every day | ORAL | 0 refills | Status: DC

## 2019-02-04 ENCOUNTER — Encounter: Payer: Self-pay | Admitting: Internal Medicine

## 2019-02-11 ENCOUNTER — Ambulatory Visit (INDEPENDENT_AMBULATORY_CARE_PROVIDER_SITE_OTHER): Payer: Self-pay | Admitting: Internal Medicine

## 2019-02-11 ENCOUNTER — Encounter: Payer: Self-pay | Admitting: Internal Medicine

## 2019-02-11 VITALS — BP 156/84

## 2019-02-11 DIAGNOSIS — I10 Essential (primary) hypertension: Secondary | ICD-10-CM

## 2019-02-11 DIAGNOSIS — F418 Other specified anxiety disorders: Secondary | ICD-10-CM

## 2019-02-11 MED ORDER — AMLODIPINE BESYLATE 5 MG PO TABS: 5.0000 mg | ORAL_TABLET | Freq: Every day | ORAL | 0 refills | Status: DC

## 2019-02-11 MED ORDER — IRBESARTAN 150 MG PO TABS: 150.0000 mg | ORAL_TABLET | Freq: Every day | ORAL | 0 refills | Status: DC

## 2019-02-11 MED ORDER — TRAZODONE HCL 150 MG PO TABS: 150.0000 mg | ORAL_TABLET | Freq: Every day | ORAL | 0 refills | Status: DC

## 2019-02-11 NOTE — Progress Notes (Signed)
Virtual Visit via Video Note  I connected with Alan Becker on 02/11/19 at  2:00 PM EDT by a video enabled telemedicine application and verified that I am speaking with the correct person using two identifiers.   I discussed the limitations of evaluation and management by telemedicine and the availability of in person appointments. The patient expressed understanding and agreed to proceed.  History of Present Illness: He checked in for virtual visit.  He was not willing to come in due to the COVID-19 pandemic.  He has been compliant with amlodipine but tells me his recent blood pressure was 156/84.  He continues to smoke cigarettes but only drinks 1 glass of red wine every other day.  He continues to struggle with anxiety and insomnia but gets symptom relief with trazodone.  He requests a refill of amlodipine and trazodone.  He denies anhedonia, crying spells, SI, or HI.  He denies headache, blurred vision, chest pain, shortness of breath, or edema.    Observations/Objective: He was in no acute distress.  He looked well.  He was calm, cooperative, and appropriate.  His mood and affect were normal.  Lab Results  Component Value Date   WBC 6.5 04/21/2018   HGB 17.0 04/21/2018   HCT 48.6 04/21/2018   PLT 340.0 04/21/2018   GLUCOSE 98 04/21/2018   CHOL 261 (H) 04/21/2018   TRIG 59.0 04/21/2018   HDL 84.30 04/21/2018   LDLCALC 165 (H) 04/21/2018   ALT 107 (H) 04/21/2018   AST 79 (H) 04/21/2018   NA 139 04/21/2018   K 3.6 04/21/2018   CL 100 04/21/2018   CREATININE 0.73 04/21/2018   BUN 8 04/21/2018   CO2 26 04/21/2018   TSH 1.48 04/21/2018   INR 1.0 04/21/2018     Assessment and Plan: It sounds like his blood pressure is not adequately well controlled so I have asked him to add an ARB to the CCB.  I slso asked him to quit smoking.  He agrees to work on his lifestyle modifications as well. He will continue to monitor his blood pressure.  I also sent in a refill for the  trazodone.   Follow Up Instructions: He agrees to try to quit smoking.  He agrees to work on his lifestyle modifications.  He agrees to be compliant with the above listed meds.  He will let me know if he develops any new symptoms.    I discussed the assessment and treatment plan with the patient. The patient was provided an opportunity to ask questions and all were answered. The patient agreed with the plan and demonstrated an understanding of the instructions.   The patient was advised to call back or seek an in-person evaluation if the symptoms worsen or if the condition fails to improve as anticipated.  I provided 25 minutes of non-face-to-face time during this encounter.   Sanda Linger, MD

## 2019-06-07 ENCOUNTER — Other Ambulatory Visit: Payer: Self-pay

## 2019-06-07 ENCOUNTER — Ambulatory Visit: Payer: Self-pay | Admitting: Internal Medicine

## 2019-06-07 ENCOUNTER — Telehealth: Payer: Self-pay | Admitting: Emergency Medicine

## 2019-06-07 DIAGNOSIS — Z20822 Contact with and (suspected) exposure to covid-19: Secondary | ICD-10-CM

## 2019-06-07 NOTE — Telephone Encounter (Signed)
Pt has appt scheduled with Dr Ronnald Ramp today, stated at check in he had chills, nausea, runny nose, and cough. Appt rescheduled and ordered placed for testing.

## 2019-06-08 ENCOUNTER — Other Ambulatory Visit: Payer: Self-pay

## 2019-06-08 DIAGNOSIS — Z20822 Contact with and (suspected) exposure to covid-19: Secondary | ICD-10-CM

## 2019-06-09 LAB — NOVEL CORONAVIRUS, NAA: SARS-CoV-2, NAA: NOT DETECTED

## 2019-06-10 ENCOUNTER — Encounter: Payer: Self-pay | Admitting: Internal Medicine

## 2019-06-30 ENCOUNTER — Other Ambulatory Visit: Payer: Self-pay | Admitting: Internal Medicine

## 2019-06-30 DIAGNOSIS — F418 Other specified anxiety disorders: Secondary | ICD-10-CM

## 2019-06-30 MED ORDER — TRAZODONE HCL 150 MG PO TABS: 150.0000 mg | ORAL_TABLET | Freq: Every day | ORAL | 0 refills | Status: DC

## 2019-08-17 ENCOUNTER — Encounter: Payer: Self-pay | Admitting: Internal Medicine

## 2019-08-17 ENCOUNTER — Other Ambulatory Visit: Payer: Self-pay

## 2019-08-17 ENCOUNTER — Other Ambulatory Visit (INDEPENDENT_AMBULATORY_CARE_PROVIDER_SITE_OTHER): Payer: Self-pay

## 2019-08-17 ENCOUNTER — Ambulatory Visit (INDEPENDENT_AMBULATORY_CARE_PROVIDER_SITE_OTHER): Payer: Self-pay | Admitting: Internal Medicine

## 2019-08-17 VITALS — BP 156/98 | HR 114 | Temp 97.9°F | Resp 16 | Ht 69.0 in | Wt 185.0 lb

## 2019-08-17 DIAGNOSIS — F418 Other specified anxiety disorders: Secondary | ICD-10-CM

## 2019-08-17 DIAGNOSIS — E519 Thiamine deficiency, unspecified: Secondary | ICD-10-CM

## 2019-08-17 DIAGNOSIS — E559 Vitamin D deficiency, unspecified: Secondary | ICD-10-CM

## 2019-08-17 DIAGNOSIS — I10 Essential (primary) hypertension: Secondary | ICD-10-CM

## 2019-08-17 LAB — BASIC METABOLIC PANEL
BUN: 6 mg/dL (ref 6–23)
CO2: 26 mEq/L (ref 19–32)
Calcium: 9.3 mg/dL (ref 8.4–10.5)
Chloride: 101 mEq/L (ref 96–112)
Creatinine, Ser: 0.82 mg/dL (ref 0.40–1.50)
GFR: 101.96 mL/min (ref >60.00)
Glucose, Bld: 104 mg/dL — ABNORMAL HIGH (ref 70–99)
Potassium: 3.5 mEq/L (ref 3.5–5.1)
Sodium: 137 mEq/L (ref 135–145)

## 2019-08-17 LAB — URINALYSIS, ROUTINE W REFLEX MICROSCOPIC
Hgb urine dipstick: NEGATIVE
Leukocytes,Ua: NEGATIVE
Nitrite: NEGATIVE
RBC / HPF: NONE SEEN (ref 0–?)
Specific Gravity, Urine: >=1.03 — AB (ref 1.000–1.030)
Total Protein, Urine: 30 — AB
Urine Glucose: NEGATIVE
Urobilinogen, UA: 1 (ref 0.0–1.0)
pH: 5.5 (ref 5.0–8.0)

## 2019-08-17 LAB — HEPATIC FUNCTION PANEL
ALT: 79 U/L — ABNORMAL HIGH (ref 0–53)
AST: 69 U/L — ABNORMAL HIGH (ref 0–37)
Albumin: 4.5 g/dL (ref 3.5–5.2)
Alkaline Phosphatase: 62 U/L (ref 39–117)
Bilirubin, Direct: 0.3 mg/dL (ref 0.0–0.3)
Total Bilirubin: 1 mg/dL (ref 0.2–1.2)
Total Protein: 7.8 g/dL (ref 6.0–8.3)

## 2019-08-17 MED ORDER — VORTIOXETINE HBR 5 MG PO TABS: 5.0000 mg | ORAL_TABLET | Freq: Every day | ORAL | 0 refills | Status: DC

## 2019-08-17 MED ORDER — VORTIOXETINE HBR 10 MG PO TABS: 10.0000 mg | ORAL_TABLET | Freq: Every day | ORAL | 0 refills | Status: DC

## 2019-08-17 NOTE — Patient Instructions (Signed)
Major Depressive Disorder, Adult Major depressive disorder (MDD) is a mental health condition. It may also be called clinical depression or unipolar depression. MDD usually causes feelings of sadness, hopelessness, or helplessness. MDD can also cause physical symptoms. It can interfere with work, school, relationships, and other everyday activities. MDD may be mild, moderate, or severe. It may occur once (single episode major depressive disorder) or it may occur multiple times (recurrent major depressive disorder). What are the causes? The exact cause of this condition is not known. MDD is most likely caused by a combination of things, which may include:  Genetic factors. These are traits that are passed along from parent to child.  Individual factors. Your personality, your behavior, and the way you handle your thoughts and feelings may contribute to MDD. This includes personality traits and behaviors learned from others.  Physical factors, such as: ? Differences in the part of your brain that controls emotion. This part of your brain may be different than it is in people who do not have MDD. ? Long-term (chronic) medical or psychiatric illnesses.  Social factors. Traumatic experiences or major life changes may play a role in the development of MDD. What increases the risk? This condition is more likely to develop in women. The following factors may also make you more likely to develop MDD:  A family history of depression.  Troubled family relationships.  Abnormally low levels of certain brain chemicals.  Traumatic events in childhood, especially abuse or the loss of a parent.  Being under a lot of stress, or long-term stress, especially from upsetting life experiences or losses.  A history of: ? Chronic physical illness. ? Other mental health disorders. ? Substance abuse.  Poor living conditions.  Experiencing social exclusion or discrimination on a regular basis. What are the  signs or symptoms? The main symptoms of MDD typically include:  Constant depressed or irritable mood.  Loss of interest in things and activities. MDD symptoms may also include:  Sleeping or eating too much or too little.  Unexplained weight change.  Fatigue or low energy.  Feelings of worthlessness or guilt.  Difficulty thinking clearly or making decisions.  Thoughts of suicide or of harming others.  Physical agitation or weakness.  Isolation. Severe cases of MDD may also occur with other symptoms, such as:  Delusions or hallucinations, in which you imagine things that are not real (psychotic depression).  Low-level depression that lasts at least a year (chronic depression or persistent depressive disorder).  Extreme sadness and hopelessness (melancholic depression).  Trouble speaking and moving (catatonic depression). How is this diagnosed? This condition may be diagnosed based on:  Your symptoms.  Your medical history, including your mental health history. This may involve tests to evaluate your mental health. You may be asked questions about your lifestyle, including any drug and alcohol use, and how long you have had symptoms of MDD.  A physical exam.  Blood tests to rule out other conditions. You must have a depressed mood and at least four other MDD symptoms most of the day, nearly every day in the same 2-week timeframe before your health care provider can confirm a diagnosis of MDD. How is this treated? This condition is usually treated by mental health professionals, such as psychologists, psychiatrists, and clinical social workers. You may need more than one type of treatment. Treatment may include:  Psychotherapy. This is also called talk therapy or counseling. Types of psychotherapy include: ? Cognitive behavioral therapy (CBT). This type of therapy   teaches you to recognize unhealthy feelings, thoughts, and behaviors, and replace them with positive thoughts  and actions. ? Interpersonal therapy (IPT). This helps you to improve the way you relate to and communicate with others. ? Family therapy. This treatment includes members of your family.  Medicine to treat anxiety and depression, or to help you control certain emotions and behaviors.  Lifestyle changes, such as: ? Limiting alcohol and drug use. ? Exercising regularly. ? Getting plenty of sleep. ? Making healthy eating choices. ? Spending more time outdoors.  Treatments involving stimulation of the brain can be used in situations with extremely severe symptoms, or when medicine or other therapies do not work over time. These treatments include electroconvulsive therapy, transcranial magnetic stimulation, and vagal nerve stimulation. Follow these instructions at home: Activity  Return to your normal activities as told by your health care provider.  Exercise regularly and spend time outdoors as told by your health care provider. General instructions  Take over-the-counter and prescription medicines only as told by your health care provider.  Do not drink alcohol. If you drink alcohol, limit your alcohol intake to no more than 1 drink a day for nonpregnant women and 2 drinks a day for men. One drink equals 12 oz of beer, 5 oz of wine, or 1 oz of hard liquor. Alcohol can affect any antidepressant medicines you are taking. Talk to your health care provider about your alcohol use.  Eat a healthy diet and get plenty of sleep.  Find activities that you enjoy doing, and make time to do them.  Consider joining a support group. Your health care provider may be able to recommend a support group.  Keep all follow-up visits as told by your health care provider. This is important. Where to find more information National Alliance on Mental Illness  www.nami.org [place] National Institute of Mental Health  www.nimh.nih.gov National Suicide Prevention Lifeline  1-800-273-TALK (8255). This is  free, 24-hour help. Contact a health care provider if:  Your symptoms get worse.  You develop new symptoms. Get help right away if:  You self-harm.  You have serious thoughts about hurting yourself or others.  You see, hear, taste, smell, or feel things that are not present (hallucinate). This information is not intended to replace advice given to you by your health care provider. Make sure you discuss any questions you have with your health care provider. Document Released: 02/01/2013 Document Revised: 09/19/2017 Document Reviewed: 04/17/2016 Elsevier Patient Education  2020 [employer]  

## 2019-08-17 NOTE — Progress Notes (Signed)
Subjective:  Patient ID: Alan Becker, male    DOB: 05-30-1976  Age: 43 y.o. MRN: [id number]  CC: Depression and Hypertension   HPI Alan Becker presents for f/up - He has not been taking any antihypertensives or monitoring his blood pressure.  He denies any recent episodes of headache, blurred vision, chest pain, or shortness of breath.  He complains of a 61-month history of depression.  He was previously prescribed Wellbutrin and Cymbalta and says it made him feel worse.  He complains of sadness, feeling hopeless and helpless, anxiety, crying spells, and anhedonia.  He tells me he is seeing a psychologist named Dr. Nicole Kindred.  He denies SI or HI.  He continues to drink beer.  It sounds like he is not drinking every day but is drinking at least every other day.  Outpatient Medications Prior to Visit  Medication Sig Dispense Refill  . Cholecalciferol 2000 units TABS Take 1 tablet (2,000 Units total) by mouth daily. 90 tablet 1  . thiamine (VITAMIN B-1) 100 MG tablet Take 1 tablet (100 mg total) by mouth daily. 90 tablet 1  . traZODone (DESYREL) 150 MG tablet Take 1 tablet (150 mg total) by mouth at bedtime. 90 tablet 0  . irbesartan (AVAPRO) 150 MG tablet Take 1 tablet (150 mg total) by mouth daily. 90 tablet 0  . amLODipine (NORVASC) 5 MG tablet Take 1 tablet (5 mg total) by mouth daily. 90 tablet 0  . Avanafil (STENDRA) 100 MG TABS Take 1 tablet by mouth daily as needed. (Patient not taking: Reported on 08/17/2019) 1 tablet 0   No facility-administered medications prior to visit.     ROS Review of Systems  Constitutional: Positive for unexpected weight change (wt gain). Negative for activity change, appetite change, diaphoresis and fatigue.  HENT: Negative.   Eyes: Negative for visual disturbance.  Respiratory: Negative for cough, shortness of breath and wheezing.   Cardiovascular: Negative for chest pain, palpitations and leg swelling.  Gastrointestinal: Negative for abdominal pain,  constipation, diarrhea, nausea and vomiting.  Genitourinary: Negative.  Negative for difficulty urinating.  Musculoskeletal: Negative.   Skin: Negative.   Psychiatric/Behavioral: Positive for dysphoric mood and sleep disturbance. Negative for agitation, behavioral problems, confusion, hallucinations, self-injury and suicidal ideas. The patient is nervous/anxious. The patient is not hyperactive.        He is having trouble sleeping during the night and sleeps some during the day    Objective:  BP (!) 156/98 (BP Location: Left Arm, Patient Position: Sitting, Cuff Size: Normal)   Pulse (!) 114   Temp 97.9 F (36.6 C) (Oral)   Resp 16   Ht 5\' 9"  (1.753 m)   Wt 185 lb (83.9 kg)   SpO2 97%   BMI 27.32 kg/m   BP Readings from Last 3 Encounters:  08/17/19 (!) 156/98  02/11/19 (!) 156/84  06/03/18 132/86    Wt Readings from Last 3 Encounters:  08/17/19 185 lb (83.9 kg)  06/03/18 180 lb (81.6 kg)  04/21/18 181 lb 4 oz (82.2 kg)    Physical Exam Vitals signs reviewed.  HENT:     Nose: Nose normal.     Mouth/Throat:     Mouth: Mucous membranes are moist.  Eyes:     General: No scleral icterus.    Conjunctiva/sclera: Conjunctivae normal.  Neck:     Musculoskeletal: Neck supple.  Cardiovascular:     Rate and Rhythm: Normal rate and regular rhythm.     Heart sounds: No murmur.  Pulmonary:     Effort: Pulmonary effort is normal.     Breath sounds: No stridor. No wheezing, rhonchi or rales.  Abdominal:     General: Abdomen is flat. Bowel sounds are normal. There is no distension.     Palpations: Abdomen is soft. There is no hepatomegaly or splenomegaly.     Tenderness: There is no abdominal tenderness.  Musculoskeletal: Normal range of motion.     Right lower leg: No edema.     Left lower leg: No edema.  Lymphadenopathy:     Cervical: No cervical adenopathy.  Skin:    General: Skin is warm and dry.  Neurological:     Mental Status: He is alert.  Psychiatric:         Attention and Perception: Attention and perception normal.        Mood and Affect: Mood is anxious and depressed. Affect is flat. Affect is not angry.        Speech: Speech normal.        Behavior: Behavior normal. Behavior is cooperative.        Thought Content: Thought content normal. Thought content is not paranoid or delusional. Thought content does not include homicidal or suicidal ideation.        Cognition and Memory: Cognition normal.        Judgment: Judgment normal.     Comments: He is tremulous     Lab Results  Component Value Date   WBC 7.9 08/17/2019   HGB 15.7 08/17/2019   HCT 45.8 08/17/2019   PLT 220.0 08/17/2019   GLUCOSE 104 (H) 08/17/2019   CHOL 261 (H) 04/21/2018   TRIG 59.0 04/21/2018   HDL 84.30 04/21/2018   LDLCALC 165 (H) 04/21/2018   ALT 79 (H) 08/17/2019   AST 69 (H) 08/17/2019   NA 137 08/17/2019   K 3.5 08/17/2019   CL 101 08/17/2019   CREATININE 0.82 08/17/2019   BUN 6 08/17/2019   CO2 26 08/17/2019   TSH 1.22 08/17/2019   INR 1.0 04/21/2018    No results found.  Assessment & Plan:   Alan Becker was seen today for depression and hypertension.  Diagnoses and all orders for this visit:  Essential hypertension-his blood pressure is not adequately well controlled.  I have asked him to restart the ARB but I recommend that he take 300 mg of irbesartan instead of 150.  His labs are negative for secondary causes or endorgan damage. -     CBC with Differential/Platelet; Future -     Basic metabolic panel; Future -     TSH; Future -     Urinalysis, Routine w reflex microscopic; Future -     Hepatic function panel; Future -     irbesartan (AVAPRO) 300 MG tablet; Take 1 tablet (300 mg total) by mouth daily.  Depression with anxiety- I have asked him to start taking Trintellix.  I am also concerned about his alcohol use so I have asked him to try to abstain from alcohol intake and he agrees to a start attending AA meetings. -     vortioxetine HBr  (TRINTELLIX) 5 MG TABS tablet; Take 1 tablet (5 mg total) by mouth daily. -     vortioxetine HBr (TRINTELLIX) 10 MG TABS tablet; Take 1 tablet (10 mg total) by mouth daily.  Thiamine deficiency -     Vitamin B1; Future  Vitamin D insufficiency -     VITAMIN D 25 Hydroxy (Vit-D Deficiency, Fractures); Future  I have discontinued Feliz Beamravis Karnes's Avanafil, irbesartan, and amLODipine. I am also having him start on vortioxetine HBr, vortioxetine HBr, and irbesartan. Additionally, I am having him maintain his Cholecalciferol, thiamine, and traZODone.  Meds ordered this encounter  Medications  . vortioxetine HBr (TRINTELLIX) 5 MG TABS tablet    Sig: Take 1 tablet (5 mg total) by mouth daily.    Dispense:  7 tablet    Refill:  0  . vortioxetine HBr (TRINTELLIX) 10 MG TABS tablet    Sig: Take 1 tablet (10 mg total) by mouth daily.    Dispense:  84 tablet    Refill:  0  . irbesartan (AVAPRO) 300 MG tablet    Sig: Take 1 tablet (300 mg total) by mouth daily.    Dispense:  90 tablet    Refill:  1     Follow-up: Return in about 2 months (around 10/17/2019).  Sanda Lingerhomas , MD

## 2019-08-18 LAB — CBC WITH DIFFERENTIAL/PLATELET
Basophils Absolute: 0 10*3/uL (ref 0.0–0.1)
Basophils Relative: 0.6 % (ref 0.0–3.0)
Eosinophils Absolute: 0 10*3/uL (ref 0.0–0.7)
Eosinophils Relative: 0.4 % (ref 0.0–5.0)
HCT: 45.8 % (ref 39.0–52.0)
Hemoglobin: 15.7 g/dL (ref 13.0–17.0)
Lymphocytes Relative: 29.2 % (ref 12.0–46.0)
Lymphs Abs: 2.3 10*3/uL (ref 0.7–4.0)
MCHC: 34.3 g/dL (ref 30.0–36.0)
MCV: 101.8 fl — ABNORMAL HIGH (ref 78.0–100.0)
Monocytes Absolute: 0.9 10*3/uL (ref 0.1–1.0)
Monocytes Relative: 11.3 % (ref 3.0–12.0)
Neutro Abs: 4.6 10*3/uL (ref 1.4–7.7)
Neutrophils Relative %: 58.5 % (ref 43.0–77.0)
Platelets: 220 10*3/uL (ref 150.0–400.0)
RBC: 4.5 Mil/uL (ref 4.22–5.81)
RDW: 14.7 % (ref 11.5–15.5)
WBC: 7.9 10*3/uL (ref 4.0–10.5)

## 2019-08-19 LAB — VITAMIN D 25 HYDROXY (VIT D DEFICIENCY, FRACTURES): VITD: 54 ng/mL (ref 30.00–100.00)

## 2019-08-19 LAB — TSH: TSH: 1.22 u[IU]/mL (ref 0.35–4.50)

## 2019-08-20 ENCOUNTER — Encounter: Payer: Self-pay | Admitting: Internal Medicine

## 2019-08-20 MED ORDER — IRBESARTAN 300 MG PO TABS: 300.0000 mg | ORAL_TABLET | Freq: Every day | ORAL | 1 refills | Status: DC

## 2019-08-21 LAB — VITAMIN B1: Vitamin B1 (Thiamine): 47 nmol/L — ABNORMAL HIGH (ref 8–30)

## 2019-10-11 ENCOUNTER — Other Ambulatory Visit: Payer: Self-pay

## 2019-11-04 ENCOUNTER — Encounter: Payer: Self-pay | Admitting: Internal Medicine

## 2019-11-05 ENCOUNTER — Encounter: Payer: Self-pay | Admitting: Internal Medicine

## 2019-11-07 ENCOUNTER — Other Ambulatory Visit: Payer: Self-pay | Admitting: Internal Medicine

## 2019-11-07 DIAGNOSIS — F418 Other specified anxiety disorders: Secondary | ICD-10-CM

## 2019-11-09 ENCOUNTER — Telehealth: Payer: Self-pay | Admitting: Internal Medicine

## 2019-11-09 NOTE — Telephone Encounter (Signed)
Pt was to follow up in December.  Can you call him to schedule appt?

## 2019-11-09 NOTE — Telephone Encounter (Signed)
RX REFILL irbesartan (AVAPRO) 300 MG tablet PHARMACY [facility] - [place], [place] - [address] Phone:  [phone]  Fax:  [phone]

## 2019-11-10 NOTE — Telephone Encounter (Signed)
Tried contacting pt to set up follow up appt. Pt does not have option to leave vm will call back later.

## 2019-11-12 NOTE — Telephone Encounter (Signed)
Pt has scheduled.

## 2019-11-12 NOTE — Telephone Encounter (Signed)
Do you have the drug reps number?

## 2019-11-12 NOTE — Telephone Encounter (Signed)
Tried to call pt. Line rang and then went silent.   Reviewed chart and notice the mychart message were not sent to the pool. IT request sent.

## 2019-11-15 ENCOUNTER — Other Ambulatory Visit: Payer: Self-pay

## 2019-11-15 ENCOUNTER — Ambulatory Visit (INDEPENDENT_AMBULATORY_CARE_PROVIDER_SITE_OTHER): Payer: Self-pay | Admitting: Internal Medicine

## 2019-11-15 ENCOUNTER — Encounter: Payer: Self-pay | Admitting: Internal Medicine

## 2019-11-15 VITALS — BP 152/102 | HR 100 | Temp 98.7°F | Ht 69.0 in | Wt 180.0 lb

## 2019-11-15 DIAGNOSIS — F418 Other specified anxiety disorders: Secondary | ICD-10-CM

## 2019-11-15 DIAGNOSIS — E559 Vitamin D deficiency, unspecified: Secondary | ICD-10-CM

## 2019-11-15 DIAGNOSIS — E519 Thiamine deficiency, unspecified: Secondary | ICD-10-CM

## 2019-11-15 DIAGNOSIS — I10 Essential (primary) hypertension: Secondary | ICD-10-CM

## 2019-11-15 DIAGNOSIS — F1020 Alcohol dependence, uncomplicated: Secondary | ICD-10-CM

## 2019-11-15 MED ORDER — TRAZODONE HCL 150 MG PO TABS: 150.0000 mg | ORAL_TABLET | Freq: Every day | ORAL | 1 refills | Status: DC

## 2019-11-15 MED ORDER — VIIBRYD STARTER PACK 10 & 20 MG PO KIT: 1.0000 | PACK | Freq: Every day | ORAL | 0 refills | Status: DC

## 2019-11-15 MED ORDER — IRBESARTAN 300 MG PO TABS: 300.0000 mg | ORAL_TABLET | Freq: Every day | ORAL | 1 refills | Status: DC

## 2019-11-15 MED ORDER — CHOLECALCIFEROL 50 MCG (2000 UT) PO TABS: 1.0000 | ORAL_TABLET | Freq: Every day | ORAL | 1 refills | Status: DC

## 2019-11-15 NOTE — Patient Instructions (Signed)

## 2019-11-15 NOTE — Progress Notes (Signed)
Subjective:  Patient ID: Alan Becker, male    DOB: Jul 14, 1976  Age: 44 y.o. MRN: [id number]  CC: Hypertension  This visit occurred during the SARS-CoV-2 public health emergency.  Safety protocols were in place, including screening questions prior to the visit, additional usage of staff PPE, and extensive cleaning of exam room while observing appropriate contact time as indicated for disinfecting solutions.    HPI [name] presents for f/up - He tells me that he stopped taking Trintellix about 2 weeks ago - partly because it was not effective and partly because he ran out.  He is uninsured and cannot afford to pay for an antidepressant.  He tells me he has curtailed his alcohol intake to 2-3 alcoholic beverages just a couple times a week.  He continues to complain of anxiety and anhedonia.  He wants to try a different antidepressant.  He has not been taking the ARB.  He says it is because he ran out but about 3 months ago I wrote a 60-monthprescription.  He denies headache, blurred vision, chest pain, shortness of breath, DOE, edema, or fatigue.  Outpatient Medications Prior to Visit  Medication Sig Dispense Refill  . thiamine (VITAMIN B-1) 100 MG tablet Take 1 tablet (100 mg total) by mouth daily. (Patient not taking: Reported on 11/15/2019) 90 tablet 1  . Cholecalciferol 2000 units TABS Take 1 tablet (2,000 Units total) by mouth daily. (Patient not taking: Reported on 11/15/2019) 90 tablet 1  . irbesartan (AVAPRO) 300 MG tablet Take 1 tablet (300 mg total) by mouth daily. (Patient not taking: Reported on 11/15/2019) 90 tablet 1  . traZODone (DESYREL) 150 MG tablet Take 1 tablet (150 mg total) by mouth at bedtime. (Patient not taking: Reported on 11/15/2019) 90 tablet 0  . vortioxetine HBr (TRINTELLIX) 10 MG TABS tablet Take 1 tablet (10 mg total) by mouth daily. 84 tablet 0  . vortioxetine HBr (TRINTELLIX) 5 MG TABS tablet Take 1 tablet (5 mg total) by mouth daily. 7 tablet 0   No  facility-administered medications prior to visit.    ROS Review of Systems  Constitutional: Negative for diaphoresis, fatigue and unexpected weight change.  HENT: Negative.   Eyes: Negative for visual disturbance.  Respiratory: Negative for cough, chest tightness, shortness of breath and wheezing.   Cardiovascular: Negative for chest pain, palpitations and leg swelling.  Gastrointestinal: Negative for abdominal pain, constipation, diarrhea, nausea and vomiting.  Endocrine: Negative.   Genitourinary: Negative.   Musculoskeletal: Negative.  Negative for myalgias.  Skin: Negative.  Negative for color change and pallor.  Neurological: Negative.  Negative for weakness and light-headedness.  Hematological: Negative for adenopathy. Does not bruise/bleed easily.  Psychiatric/Behavioral: Positive for decreased concentration, dysphoric mood and sleep disturbance. Negative for agitation, behavioral problems, confusion, hallucinations, self-injury and suicidal ideas. The patient is nervous/anxious. The patient is not hyperactive.     Objective:  BP (!) 152/102 (BP Location: Left Arm, Patient Position: Sitting, Cuff Size: Normal)   Pulse 100   Temp 98.7 F (37.1 C) (Oral)   Ht '5\' 9"'$  (1.753 m)   Wt 180 lb (81.6 kg)   SpO2 98%   BMI 26.58 kg/m   BP Readings from Last 3 Encounters:  11/15/19 (!) 152/102  08/17/19 (!) 156/98  02/11/19 (!) 156/84    Wt Readings from Last 3 Encounters:  11/15/19 180 lb (81.6 kg)  08/17/19 185 lb (83.9 kg)  06/03/18 180 lb (81.6 kg)    Physical Exam Vitals reviewed.  Constitutional:  Appearance: Normal appearance.  HENT:     Nose: Nose normal.     Mouth/Throat:     Mouth: Mucous membranes are moist.  Eyes:     General: No scleral icterus.    Conjunctiva/sclera: Conjunctivae normal.  Cardiovascular:     Rate and Rhythm: Normal rate and regular rhythm.     Heart sounds: No murmur.  Pulmonary:     Effort: Pulmonary effort is normal.     Breath  sounds: No stridor. No wheezing, rhonchi or rales.  Abdominal:     General: Abdomen is flat. Bowel sounds are normal.     Palpations: Abdomen is soft. There is no hepatomegaly or splenomegaly.     Tenderness: There is no abdominal tenderness.  Musculoskeletal:        General: Normal range of motion.     Cervical back: Neck supple.     Right lower leg: No edema.     Left lower leg: No edema.  Lymphadenopathy:     Cervical: No cervical adenopathy.  Skin:    General: Skin is warm and dry.     Coloration: Skin is not pale.  Neurological:     General: No focal deficit present.     Mental Status: He is alert.     Lab Results  Component Value Date   WBC 7.9 08/17/2019   HGB 15.7 08/17/2019   HCT 45.8 08/17/2019   PLT 220.0 08/17/2019   GLUCOSE 104 (H) 08/17/2019   CHOL 261 (H) 04/21/2018   TRIG 59.0 04/21/2018   HDL 84.30 04/21/2018   LDLCALC 165 (H) 04/21/2018   ALT 79 (H) 08/17/2019   AST 69 (H) 08/17/2019   NA 137 08/17/2019   K 3.5 08/17/2019   CL 101 08/17/2019   CREATININE 0.82 08/17/2019   BUN 6 08/17/2019   CO2 26 08/17/2019   TSH 1.22 08/17/2019   INR 1.0 04/21/2018    No results found.  Assessment & Plan:   [place] was seen today for hypertension.  Diagnoses and all orders for this visit:  Essential hypertension- His blood pressure is not well controlled due to noncompliance.  I encouraged him to start taking the ARB.  I will monitor his electrolytes and renal function. -     Basic metabolic panel -     irbesartan (AVAPRO) 300 MG tablet; Take 1 tablet (300 mg total) by mouth daily.  Thiamine deficiency- I will monitor his thiamine level. -     CBC with Differential/Platelet -     Vitamin B1  Alcohol use disorder, severe, dependence ([place]) - I commended him for decreasing his intake of alcohol.  I will monitor his liver enzymes. -     Hepatic function panel  Depression with anxiety- Will restart trazodone.  I have recommended that he start taking  vilazodone.  I gave him samples of this.  I advised him that antidepressants are not very effective if he consumes alcohol. -     traZODone (DESYREL) 150 MG tablet; Take 1 tablet (150 mg total) by mouth at bedtime. -     Vilazodone HCl (VIIBRYD STARTER PACK) 10 & 20 MG KIT; Take 1 tablet by mouth daily.  Vitamin D insufficiency -     Cholecalciferol 50 MCG (2000 UT) TABS; Take 1 tablet (2,000 Units total) by mouth daily.   I have discontinued Darnelle Maffucci Staller's vortioxetine HBr and vortioxetine HBr. I have also changed his Cholecalciferol. Additionally, I am having him start on [employer]. Lastly, I  am having him maintain his thiamine, traZODone, and irbesartan.  Meds ordered this encounter  Medications  . traZODone (DESYREL) 150 MG tablet    Sig: Take 1 tablet (150 mg total) by mouth at bedtime.    Dispense:  90 tablet    Refill:  1  . irbesartan (AVAPRO) 300 MG tablet    Sig: Take 1 tablet (300 mg total) by mouth daily.    Dispense:  90 tablet    Refill:  1  . Cholecalciferol 50 MCG (2000 UT) TABS    Sig: Take 1 tablet (2,000 Units total) by mouth daily.    Dispense:  90 tablet    Refill:  1  . Vilazodone HCl (VIIBRYD STARTER PACK) 10 & 20 MG KIT    Sig: Take 1 tablet by mouth daily.    Dispense:  1 kit    Refill:  0     Follow-up: Return in about 3 months (around 02/13/2020).  Scarlette Calico, MD

## 2020-04-27 ENCOUNTER — Emergency Department (HOSPITAL_COMMUNITY): Payer: Medicaid Other

## 2020-04-27 ENCOUNTER — Inpatient Hospital Stay (HOSPITAL_COMMUNITY)
Admission: EM | Admit: 2020-04-27 | Discharge: 2020-05-12 | DRG: 432 | Disposition: A | Discharge status: Hospice | Payer: Medicaid Other | Attending: Internal Medicine | Admitting: Internal Medicine

## 2020-04-27 ENCOUNTER — Encounter (HOSPITAL_COMMUNITY): Payer: Self-pay | Admitting: Emergency Medicine

## 2020-04-27 ENCOUNTER — Other Ambulatory Visit: Payer: Self-pay

## 2020-04-27 ENCOUNTER — Inpatient Hospital Stay (HOSPITAL_COMMUNITY): Payer: Medicaid Other

## 2020-04-27 DIAGNOSIS — E874 Mixed disorder of acid-base balance: Secondary | ICD-10-CM | POA: Diagnosis present

## 2020-04-27 DIAGNOSIS — F17210 Nicotine dependence, cigarettes, uncomplicated: Secondary | ICD-10-CM | POA: Diagnosis present

## 2020-04-27 DIAGNOSIS — K7040 Alcoholic hepatic failure without coma: Secondary | ICD-10-CM | POA: Diagnosis present

## 2020-04-27 DIAGNOSIS — K921 Melena: Secondary | ICD-10-CM | POA: Diagnosis not present

## 2020-04-27 DIAGNOSIS — G9341 Metabolic encephalopathy: Secondary | ICD-10-CM | POA: Diagnosis not present

## 2020-04-27 DIAGNOSIS — N189 Chronic kidney disease, unspecified: Secondary | ICD-10-CM | POA: Diagnosis present

## 2020-04-27 DIAGNOSIS — Z20822 Contact with and (suspected) exposure to covid-19: Secondary | ICD-10-CM | POA: Diagnosis present

## 2020-04-27 DIAGNOSIS — F10288 Alcohol dependence with other alcohol-induced disorder: Secondary | ICD-10-CM | POA: Diagnosis present

## 2020-04-27 DIAGNOSIS — I959 Hypotension, unspecified: Secondary | ICD-10-CM | POA: Diagnosis present

## 2020-04-27 DIAGNOSIS — Z7189 Other specified counseling: Secondary | ICD-10-CM

## 2020-04-27 DIAGNOSIS — E871 Hypo-osmolality and hyponatremia: Secondary | ICD-10-CM | POA: Diagnosis not present

## 2020-04-27 DIAGNOSIS — Z452 Encounter for adjustment and management of vascular access device: Secondary | ICD-10-CM

## 2020-04-27 DIAGNOSIS — Z66 Do not resuscitate: Secondary | ICD-10-CM | POA: Diagnosis not present

## 2020-04-27 DIAGNOSIS — K7011 Alcoholic hepatitis with ascites: Secondary | ICD-10-CM | POA: Diagnosis present

## 2020-04-27 DIAGNOSIS — Y900 Blood alcohol level of less than 20 mg/100 ml: Secondary | ICD-10-CM | POA: Diagnosis present

## 2020-04-27 DIAGNOSIS — K652 Spontaneous bacterial peritonitis: Secondary | ICD-10-CM | POA: Diagnosis not present

## 2020-04-27 DIAGNOSIS — E860 Dehydration: Secondary | ICD-10-CM | POA: Diagnosis present

## 2020-04-27 DIAGNOSIS — F418 Other specified anxiety disorders: Secondary | ICD-10-CM | POA: Diagnosis present

## 2020-04-27 DIAGNOSIS — N179 Acute kidney failure, unspecified: Secondary | ICD-10-CM

## 2020-04-27 DIAGNOSIS — D539 Nutritional anemia, unspecified: Secondary | ICD-10-CM | POA: Diagnosis present

## 2020-04-27 DIAGNOSIS — I129 Hypertensive chronic kidney disease with stage 1 through stage 4 chronic kidney disease, or unspecified chronic kidney disease: Secondary | ICD-10-CM | POA: Diagnosis present

## 2020-04-27 DIAGNOSIS — Z515 Encounter for palliative care: Secondary | ICD-10-CM

## 2020-04-27 DIAGNOSIS — K766 Portal hypertension: Secondary | ICD-10-CM | POA: Diagnosis present

## 2020-04-27 DIAGNOSIS — D62 Acute posthemorrhagic anemia: Secondary | ICD-10-CM | POA: Diagnosis not present

## 2020-04-27 DIAGNOSIS — K3189 Other diseases of stomach and duodenum: Secondary | ICD-10-CM | POA: Diagnosis present

## 2020-04-27 DIAGNOSIS — K7200 Acute and subacute hepatic failure without coma: Secondary | ICD-10-CM | POA: Diagnosis present

## 2020-04-27 DIAGNOSIS — K767 Hepatorenal syndrome: Secondary | ICD-10-CM | POA: Diagnosis present

## 2020-04-27 DIAGNOSIS — Z880 Allergy status to penicillin: Secondary | ICD-10-CM

## 2020-04-27 DIAGNOSIS — K7290 Hepatic failure, unspecified without coma: Secondary | ICD-10-CM

## 2020-04-27 DIAGNOSIS — Z8261 Family history of arthritis: Secondary | ICD-10-CM

## 2020-04-27 DIAGNOSIS — K449 Diaphragmatic hernia without obstruction or gangrene: Secondary | ICD-10-CM | POA: Diagnosis present

## 2020-04-27 DIAGNOSIS — Z4659 Encounter for fitting and adjustment of other gastrointestinal appliance and device: Secondary | ICD-10-CM

## 2020-04-27 DIAGNOSIS — R140 Abdominal distension (gaseous): Secondary | ICD-10-CM | POA: Diagnosis present

## 2020-04-27 DIAGNOSIS — J9601 Acute respiratory failure with hypoxia: Secondary | ICD-10-CM | POA: Diagnosis not present

## 2020-04-27 DIAGNOSIS — Z79899 Other long term (current) drug therapy: Secondary | ICD-10-CM

## 2020-04-27 DIAGNOSIS — K922 Gastrointestinal hemorrhage, unspecified: Secondary | ICD-10-CM

## 2020-04-27 DIAGNOSIS — F1020 Alcohol dependence, uncomplicated: Secondary | ICD-10-CM | POA: Diagnosis present

## 2020-04-27 DIAGNOSIS — E876 Hypokalemia: Secondary | ICD-10-CM | POA: Diagnosis not present

## 2020-04-27 DIAGNOSIS — R188 Other ascites: Secondary | ICD-10-CM

## 2020-04-27 DIAGNOSIS — E8770 Fluid overload, unspecified: Secondary | ICD-10-CM | POA: Diagnosis not present

## 2020-04-27 DIAGNOSIS — G9340 Encephalopathy, unspecified: Secondary | ICD-10-CM

## 2020-04-27 DIAGNOSIS — B182 Chronic viral hepatitis C: Secondary | ICD-10-CM | POA: Diagnosis present

## 2020-04-27 DIAGNOSIS — Z818 Family history of other mental and behavioral disorders: Secondary | ICD-10-CM

## 2020-04-27 DIAGNOSIS — R748 Abnormal levels of other serum enzymes: Secondary | ICD-10-CM | POA: Diagnosis present

## 2020-04-27 DIAGNOSIS — R0902 Hypoxemia: Secondary | ICD-10-CM

## 2020-04-27 DIAGNOSIS — N170 Acute kidney failure with tubular necrosis: Secondary | ICD-10-CM | POA: Diagnosis not present

## 2020-04-27 DIAGNOSIS — R109 Unspecified abdominal pain: Secondary | ICD-10-CM | POA: Diagnosis present

## 2020-04-27 DIAGNOSIS — K7031 Alcoholic cirrhosis of liver with ascites: Secondary | ICD-10-CM | POA: Diagnosis present

## 2020-04-27 LAB — SARS CORONAVIRUS 2 BY RT PCR (HOSPITAL ORDER, PERFORMED IN ~~LOC~~ HOSPITAL LAB): SARS Coronavirus 2: NEGATIVE

## 2020-04-27 LAB — BODY FLUID CELL COUNT WITH DIFFERENTIAL
Eos, Fluid: 0 %
Lymphs, Fluid: 17 %
Monocyte-Macrophage-Serous Fluid: 79 % (ref 50–90)
Neutrophil Count, Fluid: 4 % (ref 0–25)
Total Nucleated Cell Count, Fluid: 166 cu mm (ref 0–1000)

## 2020-04-27 LAB — MRSA PCR SCREENING: MRSA by PCR: NEGATIVE

## 2020-04-27 LAB — PROTEIN, PLEURAL OR PERITONEAL FLUID: Total protein, fluid: <3 g/dL

## 2020-04-27 LAB — LACTATE DEHYDROGENASE, PLEURAL OR PERITONEAL FLUID: LD, Fluid: 92 U/L — ABNORMAL HIGH (ref 3–23)

## 2020-04-27 LAB — COMPREHENSIVE METABOLIC PANEL
ALT: 64 U/L — ABNORMAL HIGH (ref 0–44)
AST: 222 U/L — ABNORMAL HIGH (ref 15–41)
Albumin: 1.9 g/dL — ABNORMAL LOW (ref 3.5–5.0)
Alkaline Phosphatase: 227 U/L — ABNORMAL HIGH (ref 38–126)
Anion gap: 19 — ABNORMAL HIGH (ref 5–15)
BUN: 64 mg/dL — ABNORMAL HIGH (ref 6–20)
CO2: 24 mmol/L (ref 22–32)
Calcium: 6.5 mg/dL — ABNORMAL LOW (ref 8.9–10.3)
Chloride: 92 mmol/L — ABNORMAL LOW (ref 98–111)
Creatinine, Ser: 8.55 mg/dL — ABNORMAL HIGH (ref 0.61–1.24)
GFR calc Af Amer: 8 mL/min — ABNORMAL LOW (ref >60)
GFR calc non Af Amer: 7 mL/min — ABNORMAL LOW (ref >60)
Glucose, Bld: 77 mg/dL (ref 70–99)
Potassium: 3.5 mmol/L (ref 3.5–5.1)
Sodium: 135 mmol/L (ref 135–145)
Total Bilirubin: 23.9 mg/dL (ref 0.3–1.2)
Total Protein: 5.7 g/dL — ABNORMAL LOW (ref 6.5–8.1)

## 2020-04-27 LAB — CBC WITH DIFFERENTIAL/PLATELET
Abs Immature Granulocytes: 0.31 10*3/uL — ABNORMAL HIGH (ref 0.00–0.07)
Basophils Absolute: 0.1 10*3/uL (ref 0.0–0.1)
Basophils Relative: 1 %
Eosinophils Absolute: 0.1 10*3/uL (ref 0.0–0.5)
Eosinophils Relative: 1 %
HCT: 30.7 % — ABNORMAL LOW (ref 39.0–52.0)
Hemoglobin: 11.5 g/dL — ABNORMAL LOW (ref 13.0–17.0)
Immature Granulocytes: 2 %
Lymphocytes Relative: 14 %
Lymphs Abs: 2.3 10*3/uL (ref 0.7–4.0)
MCH: 37.3 pg — ABNORMAL HIGH (ref 26.0–34.0)
MCHC: 37.5 g/dL — ABNORMAL HIGH (ref 30.0–36.0)
MCV: 99.7 fL (ref 80.0–100.0)
Monocytes Absolute: 1.1 10*3/uL — ABNORMAL HIGH (ref 0.1–1.0)
Monocytes Relative: 7 %
Neutro Abs: 11.7 10*3/uL — ABNORMAL HIGH (ref 1.7–7.7)
Neutrophils Relative %: 75 %
Platelets: 282 10*3/uL (ref 150–400)
RBC: 3.08 MIL/uL — ABNORMAL LOW (ref 4.22–5.81)
RDW: 21.8 % — ABNORMAL HIGH (ref 11.5–15.5)
WBC: 15.6 10*3/uL — ABNORMAL HIGH (ref 4.0–10.5)
nRBC: 0.2 % (ref 0.0–0.2)

## 2020-04-27 LAB — TYPE AND SCREEN
ABO/RH(D): A POS
Antibody Screen: NEGATIVE

## 2020-04-27 LAB — ALBUMIN, PLEURAL OR PERITONEAL FLUID: Albumin, Fluid: <1 g/dL

## 2020-04-27 LAB — ABO/RH: ABO/RH(D): A POS

## 2020-04-27 LAB — AMMONIA: Ammonia: 64 umol/L — ABNORMAL HIGH (ref 9–35)

## 2020-04-27 LAB — GLUCOSE, PLEURAL OR PERITONEAL FLUID: Glucose, Fluid: 82 mg/dL

## 2020-04-27 LAB — PROTIME-INR
INR: 2.8 — ABNORMAL HIGH (ref 0.8–1.2)
Prothrombin Time: 28.8 seconds — ABNORMAL HIGH (ref 11.4–15.2)

## 2020-04-27 LAB — ACETAMINOPHEN LEVEL: Acetaminophen (Tylenol), Serum: <10 ug/mL — ABNORMAL LOW (ref 10–30)

## 2020-04-27 LAB — LACTIC ACID, PLASMA
Lactic Acid, Venous: 2.9 mmol/L (ref 0.5–1.9)
Lactic Acid, Venous: 3 mmol/L (ref 0.5–1.9)

## 2020-04-27 LAB — ETHANOL: Alcohol, Ethyl (B): <10 mg/dL (ref <10)

## 2020-04-27 MED ORDER — POLYETHYLENE GLYCOL 3350 17 G PO PACK
17.0000 g | PACK | Freq: Every day | ORAL | Status: DC | PRN
  Filled 2020-04-27: qty 1

## 2020-04-27 MED ORDER — THIAMINE HCL 100 MG PO TABS
100.0000 mg | ORAL_TABLET | Freq: Every day | ORAL | Status: DC
  Administered 2020-04-27 – 2020-05-12 (×15): 100 mg via ORAL
  Filled 2020-04-27 (×17): qty 1

## 2020-04-27 MED ORDER — DOCUSATE SODIUM 100 MG PO CAPS: 100.0000 mg | ORAL_CAPSULE | Freq: Two times a day (BID) | ORAL | Status: DC | PRN

## 2020-04-27 MED ORDER — LACTATED RINGERS IV BOLUS
1000.0000 mL | Freq: Once | INTRAVENOUS | Status: AC
  Administered 2020-04-27: 1000 mL via INTRAVENOUS

## 2020-04-27 MED ORDER — POTASSIUM CHLORIDE 10 MEQ/50ML IV SOLN
10.0000 meq | INTRAVENOUS | Status: AC
  Administered 2020-04-28 (×4): 10 meq via INTRAVENOUS
  Filled 2020-04-27 (×4): qty 50

## 2020-04-27 MED ORDER — NOREPINEPHRINE 4 MG/250ML-% IV SOLN
0.0000 ug/min | INTRAVENOUS | Status: DC
  Administered 2020-04-27: 2 ug/min via INTRAVENOUS
  Administered 2020-04-27: 5 ug/min via INTRAVENOUS
  Administered 2020-04-28: 20 ug/min via INTRAVENOUS
  Administered 2020-04-28: 17 ug/min via INTRAVENOUS
  Administered 2020-04-28: 16 ug/min via INTRAVENOUS
  Administered 2020-04-28: 24 ug/min via INTRAVENOUS
  Administered 2020-04-28 – 2020-04-29 (×4): 16 ug/min via INTRAVENOUS
  Filled 2020-04-27 (×12): qty 250

## 2020-04-27 MED ORDER — SODIUM CHLORIDE 0.9 % IV SOLN
2.0000 g | INTRAVENOUS | Status: DC
  Administered 2020-04-28 – 2020-04-29 (×2): 2 g via INTRAVENOUS
  Filled 2020-04-27: qty 20
  Filled 2020-04-27: qty 2
  Filled 2020-04-27: qty 20

## 2020-04-27 MED ORDER — HYDROMORPHONE HCL 1 MG/ML PO LIQD: 0.5000 mg | Freq: Four times a day (QID) | ORAL | Status: DC | PRN

## 2020-04-27 MED ORDER — CHLORHEXIDINE GLUCONATE CLOTH 2 % EX PADS
6.0000 | MEDICATED_PAD | Freq: Every day | CUTANEOUS | Status: DC
  Administered 2020-04-27 – 2020-05-11 (×11): 6 via TOPICAL

## 2020-04-27 MED ORDER — LACTULOSE 10 GM/15ML PO SOLN
30.0000 g | Freq: Every day | ORAL | Status: DC
  Administered 2020-04-27 – 2020-05-03 (×7): 30 g via ORAL
  Filled 2020-04-27 (×5): qty 45
  Filled 2020-04-27: qty 60
  Filled 2020-04-27 (×2): qty 45

## 2020-04-27 MED ORDER — ORAL CARE MOUTH RINSE
15.0000 mL | Freq: Two times a day (BID) | OROMUCOSAL | Status: DC
  Administered 2020-04-28 – 2020-05-11 (×22): 15 mL via OROMUCOSAL

## 2020-04-27 MED ORDER — ALBUMIN HUMAN 25 % IV SOLN
50.0000 g | Freq: Once | INTRAVENOUS | Status: DC
  Administered 2020-04-27: 12.5 g via INTRAVENOUS

## 2020-04-27 MED ORDER — ALBUMIN HUMAN 25 % IV SOLN
75.0000 g | Freq: Once | INTRAVENOUS | Status: AC
  Administered 2020-04-27: 12.5 g via INTRAVENOUS
  Filled 2020-04-27 (×3): qty 300

## 2020-04-27 MED ORDER — LIDOCAINE-EPINEPHRINE (PF) 2 %-1:200000 IJ SOLN
INTRAMUSCULAR | Status: AC
  Administered 2020-04-27: 20 mL
  Filled 2020-04-27: qty 20

## 2020-04-27 MED ORDER — SODIUM CHLORIDE 0.9 % IV SOLN
2.0000 g | Freq: Once | INTRAVENOUS | Status: AC
  Administered 2020-04-27: 2 g via INTRAVENOUS
  Filled 2020-04-27: qty 20

## 2020-04-27 MED ORDER — PREDNISOLONE 5 MG PO TABS
30.0000 mg | ORAL_TABLET | Freq: Every day | ORAL | Status: DC
  Administered 2020-04-28 – 2020-05-12 (×14): 30 mg via ORAL
  Filled 2020-04-27 (×16): qty 6

## 2020-04-27 MED ORDER — SODIUM CHLORIDE 0.9 % IV SOLN
50.0000 ug/h | INTRAVENOUS | Status: DC
  Administered 2020-04-27 – 2020-04-30 (×7): 50 ug/h via INTRAVENOUS
  Filled 2020-04-27 (×10): qty 1

## 2020-04-27 MED ORDER — SODIUM CHLORIDE 0.9% IV SOLUTION: Freq: Once | INTRAVENOUS | Status: DC

## 2020-04-27 MED ORDER — SODIUM CHLORIDE 0.9 % IV SOLN
250.0000 mL | INTRAVENOUS | Status: DC
  Administered 2020-04-29 (×2): 250 mL via INTRAVENOUS

## 2020-04-27 NOTE — ED Provider Notes (Signed)
[facility] DEPT Provider Note   CSN: [id number] Arrival date & time: 04/27/20  1309     History Chief Complaint  Patient presents with  . Abdominal Pain    Alan Becker is a 44 y.o. male. Level 5 caveat due to altered mental status. HPI Patient presents for feeling sick for the last 2 to 3 weeks.  Drinking water with nausea.  Dark urine.  Reportedly family told him of jaundice today.  Has history of alcohol abuse.  States he drank about 1/5 of gin a day but has not drank in around 2 weeks.  States he has had abdominal pain.  Has not had fevers.  States he has had some blood in the stool for last couple weeks also.  States he has had Tylenol the last 3 days for the abdominal pain but states he only took 3 of them at night.  Patient is mildly confused.    Past Medical History:  Diagnosis Date  . Depression   . Hypertension   . Suicidal behavior     Patient Active Problem List   Diagnosis Date Noted  . Drug-induced erectile dysfunction 06/03/2018  . Thiamine deficiency 04/26/2018  . Alcohol use disorder, severe, dependence ([place]) 04/21/2018  . Routine general medical examination at a health care facility 04/21/2018  . Vitamin D insufficiency 04/21/2018  . Essential hypertension 01/12/2015  . Depression with anxiety 01/12/2015    History reviewed. No pertinent surgical history.     Family History  Problem Relation Age of Onset  . Depression Mother   . Arthritis Mother   . Cancer Neg Hx   . Diabetes Neg Hx   . Drug abuse Neg Hx   . Hearing loss Neg Hx   . Early death Neg Hx   . Heart disease Neg Hx   . Hyperlipidemia Neg Hx   . Hypertension Neg Hx   . Kidney disease Neg Hx     Social History   Tobacco Use  . Smoking status: Current Every Day Smoker    Packs/day: 1.00    Years: 20.00    Pack years: 20.00    Types: Cigarettes    Start date: 04/22/1995  . Smokeless tobacco: Never Used  Substance Use Topics  . Alcohol use: Yes     Alcohol/week: 5.0 standard drinks    Types: 5 Glasses of wine per week  . Drug use: No    Home Medications Prior to Admission medications   Medication Sig Start Date End Date Taking? Authorizing Provider  Cholecalciferol 50 MCG (2000 UT) TABS Take 1 tablet (2,000 Units total) by mouth daily. 11/15/19   Janith Lima, MD  irbesartan (AVAPRO) 300 MG tablet Take 1 tablet (300 mg total) by mouth daily. 11/15/19   Janith Lima, MD  thiamine (VITAMIN B-1) 100 MG tablet Take 1 tablet (100 mg total) by mouth daily. Patient not taking: Reported on 11/15/2019 04/28/18   Janith Lima, MD  traZODone (DESYREL) 150 MG tablet Take 1 tablet (150 mg total) by mouth at bedtime. 11/15/19   Janith Lima, MD  Vilazodone HCl (VIIBRYD STARTER PACK) 10 & 20 MG KIT Take 1 tablet by mouth daily. 11/15/19   Janith Lima, MD    Allergies    Penicillins  Review of Systems   Review of Systems  Unable to perform ROS: Mental status change    Physical Exam Updated Vital Signs BP (!) 87/45   Pulse 78   Temp (!)  97.4 F (36.3 C) (Oral)   Resp 15   Ht _0  (1.727 m)   Wt 79.4 kg   SpO2 100%   BMI 26.61 kg/m   Physical Exam Vitals and nursing note reviewed.  HENT:     Head: Atraumatic.  Eyes:     General: Scleral icterus present.  Pulmonary:     Effort: No respiratory distress.     Breath sounds: No wheezing.  Abdominal:     Hernia: No hernia is present.     Comments: Upper abdominal tenderness without rebound or guarding.  Some distention.  Genitourinary:    Testes:        Right: Swelling present.        Left: Swelling present.     Comments: Some edema bilateral lower extremities. Skin:    General: Skin is warm.     Capillary Refill: Capillary refill takes less than 2 seconds.     Coloration: Skin is jaundiced. Skin is not mottled.  Neurological:     Mental Status: He is alert.     Comments: Awake answers questions but some mild confusion.     ED Results / Procedures / Treatments    Labs (all labs ordered are listed, but only abnormal results are displayed) Labs Reviewed  COMPREHENSIVE METABOLIC PANEL - Abnormal; Notable for the following components:      Result Value   Chloride 92 (*)    BUN 64 (*)    Creatinine, Ser 8.55 (*)    Calcium 6.5 (*)    Total Protein 5.7 (*)    Albumin 1.9 (*)    AST 222 (*)    ALT 64 (*)    Alkaline Phosphatase 227 (*)    Total Bilirubin 23.9 (*)    GFR calc non Af Amer 7 (*)    GFR calc Af Amer 8 (*)    Anion gap 19 (*)    All other components within normal limits  PROTIME-INR - Abnormal; Notable for the following components:   Prothrombin Time 28.8 (*)    INR 2.8 (*)    All other components within normal limits  ACETAMINOPHEN LEVEL - Abnormal; Notable for the following components:   Acetaminophen (Tylenol), Serum <10 (*)    All other components within normal limits  CBC WITH DIFFERENTIAL/PLATELET - Abnormal; Notable for the following components:   WBC 15.6 (*)    RBC 3.08 (*)    Hemoglobin 11.5 (*)    HCT 30.7 (*)    MCH 37.3 (*)    MCHC 37.5 (*)    RDW 21.8 (*)    Neutro Abs 11.7 (*)    Monocytes Absolute 1.1 (*)    Abs Immature Granulocytes 0.31 (*)    All other components within normal limits  LACTIC ACID, PLASMA - Abnormal; Notable for the following components:   Lactic Acid, Venous 2.9 (*)    All other components within normal limits  AMMONIA - Abnormal; Notable for the following components:   Ammonia 64 (*)    All other components within normal limits  SARS CORONAVIRUS 2 BY RT PCR (HOSPITAL ORDER, [facility])  CULTURE, BLOOD (ROUTINE X 2)  CULTURE, BLOOD (ROUTINE X 2)  ETHANOL  RAPID URINE DRUG SCREEN, HOSP PERFORMED  LACTIC ACID, PLASMA  POC OCCULT BLOOD, ED  TYPE AND SCREEN  ABO/RH    EKG EKG Interpretation  Date/Time:  Thursday April 27 2020 13:56:15 EDT Ventricular Rate:  78 PR Interval:  QRS Duration: 111 QT Interval:  463 QTC Calculation: 528 R  Axis:   28 Text Interpretation: Sinus rhythm Prolonged QT interval No old tracing to compare Confirmed by Davonna Belling [phone]) on 04/27/2020 2:09:13 PM   Radiology CT Head Wo Contrast  Result Date: 04/27/2020 CLINICAL DATA:  Altered mental status, jaundice EXAM: CT HEAD WITHOUT CONTRAST TECHNIQUE: Contiguous axial images were obtained from the base of the skull through the vertex without intravenous contrast. COMPARISON:  None. FINDINGS: Brain: Normal anatomic configuration. No abnormal intra or extra-axial mass lesion or fluid collection. No abnormal mass effect or midline shift. No evidence of acute intracranial hemorrhage or infarct. Ventricular size is normal. Cerebellum unremarkable. Vascular: Unremarkable Skull: Intact Sinuses/Orbits: Paranasal sinuses are clear. Orbits are unremarkable. Other: Mastoid air cells and middle ear cavities are clear. IMPRESSION: Normal exam Electronically Signed   By: Fidela Salisbury MD   On: 04/27/2020 15:18   DG Chest Portable 1 View  Result Date: 04/27/2020 CLINICAL DATA:  transported pt from home to [facility] ED and reports the following" Pt drank around one fifth of gin daily for 3 weeks then stopped. Starting 2.5 weeks ago pt began feeling sick. Has been drinking water and Pedialyte. Urine has been a.*comment was truncated*weakness EXAM: PORTABLE CHEST 1 VIEW COMPARISON:  None. FINDINGS: Normal cardiac silhouette. There is low lung volumes. Elevation of the RIGHT hemidiaphragm. There is bibasilar atelectasis. Cannot exclude infiltrate at the RIGHT lung base. No pulmonary edema. No pneumothorax. IMPRESSION: RIGHT lower lobe atelectasis versus infiltrate. Electronically Signed   By: Suzy Bouchard M.D.   On: 04/27/2020 15:10    Procedures Procedures (including critical care time)  Medications Ordered in ED Medications  cefTRIAXone (ROCEPHIN) 2 g in sodium chloride 0.9 % 100 mL IVPB (has no administration in time range)  lactated ringers bolus 1,000 mL (has no  administration in time range)  lactated ringers bolus 1,000 mL (1,000 mLs Intravenous New Bag/Given 04/27/20 1423)    ED Course  I have reviewed the triage vital signs and the nursing notes.  Pertinent labs & imaging results that were available during my care of the patient were reviewed by me and considered in my medical decision making (see chart for details).    MDM Rules/Calculators/A&P                           Patient with acute hepatic failure.  I think likely due to alcohol.  Hypotension.  Not febrile.  However does have some abdominal tenderness.  With some mild distention and abdominal tenderness Rocephin given empirically to potentially treat for a peritonitis.  Fluid bolus given for the hypertension.  Blood pressure improved somewhat.  Patient states he has had some blood in the stool.  Guaiac exam did show small amount of frank red blood. Patient's labs started to come back.  White count mildly elevated.  Mild anemia.  However does have a new renal failure with creatinine of 8.  Also bilirubin of 23 and INR of 2.8.  With this his meld score is 40 with an estimated 38-monthmortality of 71.3 percent.  Will discuss with GI about stability and able to stay here versus having transfer to a tertiary referral center.  [place]  [facility].  All medical and dental staff required 8 and cultural competency for cultural humility training as part  is reviewed and  CRITICAL CARE Performed by: Davonna Belling Total critical care time: 30 minutes Critical care time was exclusive of separately billable procedures and treating other patients. Critical care was necessary to treat or prevent imminent or life-threatening deterioration. Critical care was time spent personally by me on the following activities: development of treatment plan with patient and/or surrogate as well as  nursing, discussions with consultants, evaluation of patient's response to treatment, examination of patient, obtaining history from patient or surrogate, ordering and performing treatments and interventions, ordering and review of laboratory studies, ordering and review of radiographic studies, pulse oximetry and re-evaluation of patient's condition.  Final Clinical Impression(s) / ED Diagnoses Final diagnoses:  Acute liver failure without hepatic coma  AKI (acute kidney injury) ([place])  Gastrointestinal hemorrhage, unspecified gastrointestinal hemorrhage type    Rx / DC Orders ED Discharge Orders    None       Davonna Belling, MD 04/27/20 1557

## 2020-04-27 NOTE — Procedures (Signed)
[facility] pulmonary critical care medicine diagnostic paracentesis note  Indication: Possible spontaneous bacterial peritonitis in a patient with alcoholic cirrhosis with ascites  The patient and his mother were informed of the risks and benefits and gave consent, see written chart.  Procedure description: The patient was positioned in the supine position.  Ultrasound was used to identify the best pocket of fluid in the right flank.  Skin was marked.  Skin was prepped sterilely with chlorhexidine.  Sterile gloves were used for the procedure.  2% lidocaine was injected into the skin and then the peritoneal space was numbed as well.  30 cc of dark yellow-colored fluid was removed without complication.  The needle was removed and the procedure was completed.   Blood loss was minimal.    There were no complications from the procedure.    He tolerated it well.  Heber [place], MD [facility] Pager: [phone] Cell: [phone] If no response, call [phone]

## 2020-04-27 NOTE — Consult Note (Addendum)
Referring Provider: Dr. Rubin Payor Primary Care Physician:  [name], MD Primary Gastroenterologist:  Gentry Fitz  Reason for Consultation:  Jaundice; Liver Failure  HPI: Alan Becker is a 44 y.o. male with history of alcohol abuse (drinks 1/5 of gin almost everyday for 8 years) and last had alcohol 2.5 weeks ago. Reports occasional vomiting of nonbloody emesis on occasion when he drinks alcohol. Has been having abdominal distention and jaundice for about 2 weeks. Occasional red blood per rectum and rectal by ER showed small amount of red blood. TB 23.9, ALP 227, AST 222, ALT 64, INR 2.8, Ammonia 64, Cr 8.55. Took about 3 Tylenol nightly for the past 3 nights. Mother at bedside.  Past Medical History:  Diagnosis Date  . Depression   . Hypertension   . Suicidal behavior     History reviewed. No pertinent surgical history.  Prior to Admission medications   Medication Sig Start Date End Date Taking? Authorizing Provider  Cholecalciferol 50 MCG (2000 UT) TABS Take 1 tablet (2,000 Units total) by mouth daily. 11/15/19   [name], MD  irbesartan (AVAPRO) 300 MG tablet Take 1 tablet (300 mg total) by mouth daily. 11/15/19   [name], MD  thiamine (VITAMIN B-1) 100 MG tablet Take 1 tablet (100 mg total) by mouth daily. Patient not taking: Reported on 11/15/2019 04/28/18   [name], MD  traZODone (DESYREL) 150 MG tablet Take 1 tablet (150 mg total) by mouth at bedtime. 11/15/19   [name], MD  Vilazodone HCl (VIIBRYD STARTER PACK) 10 & 20 MG KIT Take 1 tablet by mouth daily. 11/15/19   [name], MD    Scheduled Meds: Continuous Infusions: . cefTRIAXone (ROCEPHIN)  IV     PRN Meds:.  Allergies as of 04/27/2020 - Review Complete 04/27/2020  Allergen Reaction Noted  . Penicillins Other (See Comments) 01/12/2015    Family History  Problem Relation Age of Onset  . Depression Mother   . Arthritis Mother   . Cancer Neg Hx   . Diabetes Neg Hx   . Drug  abuse Neg Hx   . Hearing loss Neg Hx   . Early death Neg Hx   . Heart disease Neg Hx   . Hyperlipidemia Neg Hx   . Hypertension Neg Hx   . Kidney disease Neg Hx     Social History   Socioeconomic History  . Marital status: Divorced    Spouse name: Not on file  . Number of children: Not on file  . Years of education: Not on file  . Highest education level: Not on file  Occupational History  . Not on file  Tobacco Use  . Smoking status: Current Every Day Smoker    Packs/day: 1.00    Years: 20.00    Pack years: 20.00    Types: Cigarettes    Start date: 04/22/1995  . Smokeless tobacco: Never Used  Substance and Sexual Activity  . Alcohol use: Yes    Alcohol/week: 5.0 standard drinks    Types: 5 Glasses of wine per week  . Drug use: No  . Sexual activity: Yes  Other Topics Concern  . Not on file  Social History Narrative  . Not on file   Social Determinants of Health   Financial Resource Strain:   . Difficulty of Paying Living Expenses:   Food Insecurity:   . Worried About Programme researcher, broadcasting/film/video in the Last Year:   . [employer]  in the Last Year:   Transportation Needs:   . Film/video editor (Medical):   Marland Kitchen Lack of Transportation (Non-Medical):   Physical Activity:   . Days of Exercise per Week:   . Minutes of Exercise per Session:   Stress:   . Feeling of Stress :   Social Connections:   . Frequency of Communication with Friends and Family:   . Frequency of Social Gatherings with Friends and Family:   . Attends Religious Services:   . Active Member of Clubs or Organizations:   . Attends Archivist Meetings:   Marland Kitchen Marital Status:   Intimate Partner Violence:   . Fear of Current or Ex-Partner:   . Emotionally Abused:   Marland Kitchen Physically Abused:   . Sexually Abused:     Review of Systems: All negative except as stated above in HPI.  Physical Exam: Vital signs: Vitals:   04/27/20 1520 04/27/20 1521  BP:  (!) 87/45  Pulse: 78   Resp: 17 15   Temp:    SpO2: 100%   T 97.4   General:   Lethargic, jaundice, thin, no acute distress  Head: normocephalic, atraumatic Eyes: +icteric sclera ENT: oropharynx clear Neck: supple, nontender Lungs:  Clear throughout to auscultation.   No wheezes, crackles, or rhonchi. No acute distress. Heart:  Regular rate and rhythm; no murmurs, clicks, rubs,  or gallops. Abdomen: distended, diffuse tenderness with guarding, +BS  Rectal:  Deferred Ext: no edema  GI:  Lab Results: Recent Labs    04/27/20 1357  WBC 15.6*  HGB 11.5*  HCT 30.7*  PLT 282   BMET Recent Labs    04/27/20 1357  NA 135  K 3.5  CL 92*  CO2 24  GLUCOSE 77  BUN 64*  CREATININE 8.55*  CALCIUM 6.5*   LFT Recent Labs    04/27/20 1357  PROT 5.7*  ALBUMIN 1.9*  AST 222*  ALT 64*  ALKPHOS 227*  BILITOT 23.9*   PT/INR Recent Labs    04/27/20 1357  LABPROT 28.8*  INR 2.8*     Studies/Results: CT ABDOMEN PELVIS WO CONTRAST  Result Date: 04/27/2020 CLINICAL DATA:  Acute, nonlocalized abdominal pain, jaundice EXAM: CT ABDOMEN AND PELVIS WITHOUT CONTRAST TECHNIQUE: Multidetector CT imaging of the abdomen and pelvis was performed following the standard protocol without IV contrast. COMPARISON:  None. FINDINGS: Lower chest: Mild right basilar atelectasis. Moderate coronary artery calcification. Global cardiac size within normal limits. Hepatobiliary: The liver is moderately enlarged and is diffusely heterogeneously markedly hypoechoic, a finding that can be seen with acute and chronic hepatic steatosis. A more focal 3.3 x 2.2 cm hypoattenuating lesion within segment 4A is indeterminate, not well assessed on this noncontrast examination. There is no definite intrahepatic biliary ductal dilation. The gallbladder is hyperdense suggesting either vicarious excretion of contrast from a prior examination or gallbladder sludge, however, the gallbladder is not distended and no definite pericholecystic inflammatory changes  are identified. There is moderate ascites noted. Pancreas: Unremarkable Spleen: Unremarkable Adrenals/Urinary Tract: Unremarkable Stomach/Bowel: The stomach is unremarkable. Large and small bowel are unremarkable. Appendix normal. No free intraperitoneal gas. Vascular/Lymphatic: The abdominal vasculature is age-appropriate on this noncontrast examination. A single pathologically enlarged periportal lymph node is seen at axial image 42/2 measuring 2.6 by 1.9 cm no other pathologic adenopathy within the abdomen and pelvis. Reproductive: Unremarkable Other: None significant Musculoskeletal: Unremarkable IMPRESSION: Hepatomegaly, parenchymal changes, and moderate ascites. These findings can be seen in chronic hepatic steatohepatitis as well as acute fatty  disease of the liver which has a variety of possible causes including acute infectious hepatitis, drug toxicity, and alcoholic hepatitis, among other etiologies. Focal hypoattenuating mass within segment 4 a of the liver is not well characterized on this noncontrast examination and could be better assessed with dedicated liver protocol CT or MRI examination once the patient's acute issues have resolved. Pathologic periportal adenopathy is nonspecific and may be simply reactive given the pathologic process within the liver. Electronically Signed   By: Fidela Salisbury MD   On: 04/27/2020 16:30   CT Head Wo Contrast  Result Date: 04/27/2020 CLINICAL DATA:  Altered mental status, jaundice EXAM: CT HEAD WITHOUT CONTRAST TECHNIQUE: Contiguous axial images were obtained from the base of the skull through the vertex without intravenous contrast. COMPARISON:  None. FINDINGS: Brain: Normal anatomic configuration. No abnormal intra or extra-axial mass lesion or fluid collection. No abnormal mass effect or midline shift. No evidence of acute intracranial hemorrhage or infarct. Ventricular size is normal. Cerebellum unremarkable. Vascular: Unremarkable Skull: Intact  Sinuses/Orbits: Paranasal sinuses are clear. Orbits are unremarkable. Other: Mastoid air cells and middle ear cavities are clear. IMPRESSION: Normal exam Electronically Signed   By: Fidela Salisbury MD   On: 04/27/2020 15:18   DG Chest Portable 1 View  Result Date: 04/27/2020 CLINICAL DATA:  transported pt from home to [facility] ED and reports the following" Pt drank around one fifth of gin daily for 3 weeks then stopped. Starting 2.5 weeks ago pt began feeling sick. Has been drinking water and Pedialyte. Urine has been a.*comment was truncated*weakness EXAM: PORTABLE CHEST 1 VIEW COMPARISON:  None. FINDINGS: Normal cardiac silhouette. There is low lung volumes. Elevation of the RIGHT hemidiaphragm. There is bibasilar atelectasis. Cannot exclude infiltrate at the RIGHT lung base. No pulmonary edema. No pneumothorax. IMPRESSION: RIGHT lower lobe atelectasis versus infiltrate. Electronically Signed   By: Suzy Bouchard M.D.   On: 04/27/2020 15:10    Impression/Plan: 44 yo with acute liver failure and likely has decompensated alcoholic cirrhosis but also has renal failure. Rectal bleeding likely due to elevated INR and would give FFP. Consider Rifaximin and Lactulose if mental status worsens. Supportive care for his liver failure. Not a transplant candidate due to ongoing alcohol use. Mother questions whether he could be a liver transplant candidate in the future and he would need referral to [facility] or [facility] for evaluation after he has stopped drinking for at least 4-6 months to even be considered most centers have a minimum of 6 months with documented sobriety but his renal failure of course complicates that possibility. Too early to know whether dialysis is needed and defer to nephrology (mother wants dialysis started ASAP if it is needed) but agree with volume resuscitation right now because he is dehydrated. He likely will be transferred to [facility] due to his renal failure. Will follow.    LOS: 0 days   Lear Ng  04/27/2020, 4:44 PM  Questions please call [phone]

## 2020-04-27 NOTE — ED Notes (Signed)
Date and time results received: 04/27/20 3:27 PM  (use smartphrase ".now" to insert current time)  Test: Bili Critical Value: 23.9 Test: Lactic Critical Value: 2.9  Name of Provider Notified: [place]  Orders Received? Or Actions Taken?: Orders Received - See Orders for details

## 2020-04-27 NOTE — ED Provider Notes (Signed)
3:15 PM Care assumed from Dr. Rubin Payor.  At time of transfer of care, patient is awaiting diagnostic work-up results to determine disposition.  Previous provider reports that patient has history of alcohol abuse and quit drinking several weeks ago.  Since then, he has been developing abdominal pain, distention, fatigue, shortness of breath, darkened urine, and jaundice.  He arrived and has been hypotensive and is receiving LR for fluids.  He has not yet started on pressors.  Patient was given antibiotics due to concern for GI bleed with some rectal bleeding in the setting of likely liver disease.  Code sepsis was not called as there was no fever initially.  Previous team felt this was more like related to acute liver failure or hepatorenal type syndrome over SBP.  Diagnostic tap had been considered but not performed at this time.  Plan of care is to wait for CMP to return and then get a CT abdomen pelvis either with or without contrast based on kidney function.  Anticipate calling gastroenterology for recommendations to determine if patient is going to be stable for admission at this facility or will need transfer to a transplant center.  4:26 PM Just spoke with critical care who will come see the patient.  He thinks the candidate may be a candidate for treatment here at [facility] long set of [facility] if the nephrology team feels that CRRT would be appropriate.  He will come see the patient.  The GI team also called and spoke to Dr. Rubin Payor and they are going to come to the patient.  They feel that the candidate is likely not a good transplant candidate based on the alcohol abuse and thus they do not feel he would be appropriate to transfer to a transplant center at this time.  Now only awaiting nephrology prior to admission to the ICU here.  Patient admitted for further management to the ICU.  Clinical Impression: 1. Acute liver failure without hepatic coma   2. AKI (acute kidney injury) (HCC)   3.  Gastrointestinal hemorrhage, unspecified gastrointestinal hemorrhage type     Disposition: Admit  This note was prepared with assistance of Dragon voice recognition software. Occasional wrong-word or sound-a-like substitutions may have occurred due to the inherent limitations of voice recognition software.     Smrithi Pigford, Canary Brim, MD 04/27/20 [phone]

## 2020-04-27 NOTE — TOC Initial Note (Addendum)
Transition of Care [facility]) - Initial/Assessment Note    Patient Details  Name: Alan Becker MRN: [id number] Date of Birth: October 15, 1976  Transition of Care [facility]) CM/SW Contact:    Elliot Cousin, RN Phone Number: [phone] 04/27/2020, 6:08 PM  Clinical Narrative:                 TOC CM spoke to pt and mother, Alan Becker at bedside. Pt currently does not work. His mother assist with medications. States he does continue to see, PCP Dr Yetta Barre. Pt is requesting to see Artist.   Expected Discharge Plan: Home/Self Care Barriers to Discharge: Continued Medical Work up   Patient Goals and CMS Choice        Expected Discharge Plan and Services Expected Discharge Plan: Home/Self Care In-house Referral: Clinical Social Work Discharge Planning Services: CM Consult   Living arrangements for the past 2 months: Apartment                                      Prior Living Arrangements/Services Living arrangements for the past 2 months: Apartment Lives with:: Self Patient language and need for interpreter reviewed:: Yes Do you feel safe going back to the place where you live?: Yes      Need for Family Participation in Patient Care: No (Comment) Care giver support system in place?: No (comment)      Activities of Daily Living      Permission Sought/Granted Permission sought to share information with : Case Manager, PCP, Family Supports Permission granted to share information with : Yes, Verbal Permission Granted  Share Information with NAME: Alan Becker  Permission granted to share info w AGENCY: PCP  Permission granted to share info w Relationship: mother  Permission granted to share info w Contact Information: [phone]  Emotional Assessment Appearance:: Appears stated age Attitude/Demeanor/Rapport: Engaged Affect (typically observed): Accepting Orientation: : Oriented to Self, Oriented to Place, Oriented to  Time, Oriented to Situation   Psych  Involvement: No (comment)  Admission diagnosis:  Hepatorenal syndrome [facility]) [K76.7] Patient Active Problem List   Diagnosis Date Noted  . Hepatorenal syndrome (HCC) 04/27/2020  . Drug-induced erectile dysfunction 06/03/2018  . Thiamine deficiency 04/26/2018  . Alcohol use disorder, severe, dependence (HCC) 04/21/2018  . Routine general medical examination at a health care facility 04/21/2018  . Vitamin D insufficiency 04/21/2018  . Essential hypertension 01/12/2015  . Depression with anxiety 01/12/2015   PCP:  [name], MD Pharmacy:   [facility] - [place], [place] - [address] [place] [address] Phone: [phone] Fax: [phone]  [facility] - [place], [place] - [address] [place] [address] Phone: [phone] Fax: [phone]  [id number] - [place], [place] - [address] [place] [address] Phone: [phone] Fax: [phone]  [id number] - [place], [place] - [address] AT [facility] [address] [place] [address] Phone: [phone] Fax: [phone]     Social Determinants of Health (SDOH) Interventions    Readmission Risk Interventions No flowsheet data found.

## 2020-04-27 NOTE — ED Notes (Signed)
Called lab for plasma. Said they have to thaw it and it should be ready in about an hour.

## 2020-04-27 NOTE — Progress Notes (Addendum)
eLink Physician-Brief Progress Note Patient Name: Alan Becker DOB: 11-Mar-1976 MRN: [id number]   Date of Service  04/27/2020  HPI/Events of Note  Patient requesting a diet. He was admitted earlier today with hepatic , and renal failure, hypotension, concern for hepato-renal syndrome.  eICU Interventions  Renal  diet ordered. New patient evaluation completed.        Thomasene Lot [name] 04/27/2020, 9:28 PM

## 2020-04-27 NOTE — Progress Notes (Signed)
eLink Physician-Brief Progress Note Patient Name: Alan Becker DOB: 1975/11/17 MRN: [id number]   Date of Service  04/27/2020  HPI/Events of Note  Abdominal pain, patient requesting pain medication.  eICU Interventions  Oral Dilaudid ordered.        Thomasene Lot Jeylin Woodmansee 04/27/2020, 11:54 PM

## 2020-04-27 NOTE — Progress Notes (Signed)
eLink Physician-Brief Progress Note Patient Name: Alan Becker DOB: 1976-09-27 MRN: [id number]   Date of Service  04/27/2020  HPI/Events of Note  Hypotension despite infusion of 10 mcg of Norepinephrine via piv, AKI precludes PICC placement.  eICU Interventions  Albumin 25 % 75 gm iv bolus stat, PCCM requested to put in a central line, Levophed order modified for increased dosing with central line placement.        [name] 04/27/2020, 10:11 PM

## 2020-04-27 NOTE — ED Notes (Signed)
Report will be called on night shift, per purple man request.

## 2020-04-27 NOTE — Consult Note (Addendum)
Reason for Consult: Acute kidney injury Referring Physician: Max Fickle, MD (CCM)  HPI:  44 year old Caucasian man with past medical history significant for depression/anxiety, past suicidal behavior and hypertension (poorly adherent to antihypertensive therapy) who presented to the emergency room with a 2-week history of increasing abdominal distention/pain along with generalized fatigue and malaise for the last 3 weeks since he stopped drinking alcohol.  Prior to that, he reports "on and off" consumption of at least 1/5 of gin daily for the past 7 years following his divorce.  He has had some intermittent nausea but without vomiting and reports that his appetite has been "better" for the past week or so after initially deteriorating.  Over the past 3 days, he has been taking ibuprofen and Tylenol for abdominal pain and yesterday took a dose of irbesartan.  He denies any chest pain and has some exertional dyspnea without any shortness of breath at rest although reports that his abdominal distention prevents him from doing a full breath.  He has been having some postural dizziness.  He denies any prior history of acute kidney injury, history of recurrent nephrolithiasis or strong familial history of renal disease/ESRD.  His creatinine 8 months ago was 0.8 and when checked today labs were as follows: Creatinine 8.6, BUN 64, albumin 1.9, AST 222, ALT 64, total bilirubin 23.9, ammonia 64, potassium 3.5, bicarbonate 24 and sodium 135.  WBC count 15,600, hemoglobin 11.5.  Tylenol level undetectable, TSH 1.2.  Urinalysis and urine electrolytes are pending.  Noncontrast abdominal CT scan shows hepatomegaly/parenchymal changes consistent with chronic hepatic steatohepatitis and moderate ascites.  Past Medical History:  Diagnosis Date  . Depression   . Hypertension   . Suicidal behavior     History reviewed. No pertinent surgical history.  Family History  Problem Relation Age of Onset  . Depression  Mother   . Arthritis Mother   . Cancer Neg Hx   . Diabetes Neg Hx   . Drug abuse Neg Hx   . Hearing loss Neg Hx   . Early death Neg Hx   . Heart disease Neg Hx   . Hyperlipidemia Neg Hx   . Hypertension Neg Hx   . Kidney disease Neg Hx     Social History:  reports that he has been smoking cigarettes. He started smoking about 25 years ago. He has a 20.00 pack-year smoking history. He has never used smokeless tobacco. He reports current alcohol use of about 5.0 standard drinks of alcohol per week. He reports that he does not use drugs.  Allergies:  Allergies  Allergen Reactions  . Penicillins Other (See Comments)    Did it involve swelling of the face/tongue/throat, SOB, or low BP? No Did it involve sudden or severe rash/hives, skin peeling, or any reaction on the inside of your mouth or nose? Rash Did you need to seek medical attention at a hospital or doctor's office? Yes When did it last happen?Child If all above answers are "NO", may proceed with cephalosporin use.      Medications:  Scheduled: . sodium chloride   Intravenous Once  . lactulose  30 g Oral Daily  . [START ON 04/28/2020] prednisoLONE  30 mg Oral Q breakfast    BMP Latest Ref Rng & Units 04/27/2020 08/17/2019 04/21/2018  Glucose 70 - 99 mg/dL 77 825(O) 98  BUN 6 - 20 mg/dL 03(B) 6 8  Creatinine 0.48 - 1.24 mg/dL 8.89(V) 6.94 5.03  Sodium 135 - 145 mmol/L 135 137 139  Potassium  3.5 - 5.1 mmol/L 3.5 3.5 3.6  Chloride 98 - 111 mmol/L 92(L) 101 100  CO2 22 - 32 mmol/L 24 26 26   Calcium 8.9 - 10.3 mg/dL ) 9.3 9.6   CBC Latest Ref Rng & Units 04/27/2020 08/17/2019 04/21/2018  WBC 4.0 - 10.5 K/uL 15.6(H) 7.9 6.5  Hemoglobin 13.0 - 17.0 g/dL 11.5(L) 15.7 17.0  Hematocrit 39 - 52 % 30.7(L) 45.8 48.6  Platelets 150 - 400 K/uL 282 220.0 340.0     CT ABDOMEN PELVIS WO CONTRAST  Result Date: 04/27/2020 CLINICAL DATA:  Acute, nonlocalized abdominal pain, jaundice EXAM: CT ABDOMEN AND PELVIS WITHOUT CONTRAST  TECHNIQUE: Multidetector CT imaging of the abdomen and pelvis was performed following the standard protocol without IV contrast. COMPARISON:  None. FINDINGS: Lower chest: Mild right basilar atelectasis. Moderate coronary artery calcification. Global cardiac size within normal limits. Hepatobiliary: The liver is moderately enlarged and is diffusely heterogeneously markedly hypoechoic, a finding that can be seen with acute and chronic hepatic steatosis. A more focal 3.3 x 2.2 cm hypoattenuating lesion within segment 4A is indeterminate, not well assessed on this noncontrast examination. There is no definite intrahepatic biliary ductal dilation. The gallbladder is hyperdense suggesting either vicarious excretion of contrast from a prior examination or gallbladder sludge, however, the gallbladder is not distended and no definite pericholecystic inflammatory changes are identified. There is moderate ascites noted. Pancreas: Unremarkable Spleen: Unremarkable Adrenals/Urinary Tract: Unremarkable Stomach/Bowel: The stomach is unremarkable. Large and small bowel are unremarkable. Appendix normal. No free intraperitoneal gas. Vascular/Lymphatic: The abdominal vasculature is age-appropriate on this noncontrast examination. A single pathologically enlarged periportal lymph node is seen at axial image 42/2 measuring 2.6 by 1.9 cm no other pathologic adenopathy within the abdomen and pelvis. Reproductive: Unremarkable Other: None significant Musculoskeletal: Unremarkable IMPRESSION: Hepatomegaly, parenchymal changes, and moderate ascites. These findings can be seen in chronic hepatic steatohepatitis as well as acute fatty disease of the liver which has a variety of possible causes including acute infectious hepatitis, drug toxicity, and alcoholic hepatitis, among other etiologies. Focal hypoattenuating mass within segment 4 a of the liver is not well characterized on this noncontrast examination and could be better assessed with  dedicated liver protocol CT or MRI examination once the patient's acute issues have resolved. Pathologic periportal adenopathy is nonspecific and may be simply reactive given the pathologic process within the liver. Electronically Signed   By: 06/28/2020 MD   On: 04/27/2020 16:30   CT Head Wo Contrast  Result Date: 04/27/2020 CLINICAL DATA:  Altered mental status, jaundice EXAM: CT HEAD WITHOUT CONTRAST TECHNIQUE: Contiguous axial images were obtained from the base of the skull through the vertex without intravenous contrast. COMPARISON:  None. FINDINGS: Brain: Normal anatomic configuration. No abnormal intra or extra-axial mass lesion or fluid collection. No abnormal mass effect or midline shift. No evidence of acute intracranial hemorrhage or infarct. Ventricular size is normal. Cerebellum unremarkable. Vascular: Unremarkable Skull: Intact Sinuses/Orbits: Paranasal sinuses are clear. Orbits are unremarkable. Other: Mastoid air cells and middle ear cavities are clear. IMPRESSION: Normal exam Electronically Signed   By: 06/28/2020 MD   On: 04/27/2020 15:18   DG Chest Portable 1 View  Result Date: 04/27/2020 CLINICAL DATA:  transported pt from home to [facility] ED and reports the following" Pt drank around one fifth of gin daily for 3 weeks then stopped. Starting 2.5 weeks ago pt began feeling sick. Has been drinking water and Pedialyte. Urine has been a.*comment was truncated*weakness EXAM: PORTABLE CHEST 1 VIEW  COMPARISON:  None. FINDINGS: Normal cardiac silhouette. There is low lung volumes. Elevation of the RIGHT hemidiaphragm. There is bibasilar atelectasis. Cannot exclude infiltrate at the RIGHT lung base. No pulmonary edema. No pneumothorax. IMPRESSION: RIGHT lower lobe atelectasis versus infiltrate. Electronically Signed   By: Genevive Bi M.D.   On: 04/27/2020 15:10    Review of Systems Blood pressure (!) 92/57, pulse 79, temperature (!) 97.4 F (36.3 C), temperature source Oral, resp. rate  (!) 22, height 5\' 8"  (1.727 m), weight 79.4 kg, SpO2 99 %. Physical Exam  Assessment/Plan: 1.  Acute kidney injury: High index of suspicion based on the history/timeline of events and available database that this might be hepatorenal syndrome type I however, given that this is a diagnosis of exclusion, agree with the need to check a urinalysis, urine electrolytes and to rule out spontaneous bacterial peritonitis.  He does have additional sources of hemodynamic renal injury with decreased oral intake, NSAIDs and recent ARB use.  He has a moderate amount of ascites and I would recommend therapeutic paracentesis of 3 to 4 L with concomitant administration of intravenous albumin 25%-75 g for symptomatic relief and possibly decrease intra-abdominal pressure/improve renal perfusion.  I agree with empirically starting him on intravenous Levophed and octreotide to try and improve renal hemodynamics.  I will go ahead and order some intravenous albumin to be administered tonight.  There is no acute indication for dialysis at this time and we had a lengthy discussion (with the patient and his mother) that this would not alter his prognosis with regards to his liver injury and may indeed significantly impact his quality of life. Avoid nephrotoxic medications including NSAIDs and iodinated intravenous contrast exposure unless the latter is absolutely indicated.  Preferred narcotic agents for pain control are hydromorphone, fentanyl, and methadone. Morphine should not be used. Avoid Baclofen and avoid oral sodium phosphate and magnesium citrate based laxatives / bowel preps. Continue strict Input and Output monitoring. Will monitor the patient closely with you and intervene or adjust therapy as indicated by changes in clinical status/labs  2.  Acute decompensated alcoholic hepatitis: Avoid Tylenol/additional hepatotoxins and agree with efforts at improving hemodynamic status with intravenous Levophed/octreotide.  Will give  albumin overnight and reassess labs again in the morning.  He has been seen by Dr. of gastroenterology and his note reviewed; currently not a candidate for liver transplantation. 3.  Acute metabolic encephalopathy: Likely secondary to hepatic encephalopathy based on elevated ammonia level with possibly some impact from azotemia.  On lactulose. 4.  Hypotension: Discontinue ARB/oral antihypertensives at this time and continue Levophed with possible transition to midodrine in the near future.  Hera Celaya K. 04/27/2020, 6:37 PM

## 2020-04-27 NOTE — Procedures (Addendum)
Central Venous Catheter Insertion Procedure Note  Alan Becker  [id number]  31-Jul-1976  Date:04/27/20  Time:11:14 PM   Provider Performing:Seong-Joo Ardeth Perfect   Procedure: Insertion of Non-tunneled Central Venous Catheter(36556) without US guidance  Indication(s) Medication administration and Difficult access  Consent Risks of the procedure as well as the alternatives and risks of each were explained to the patient and/or caregiver.  Consent for the procedure was obtained and is signed in the bedside chart  Anesthesia Topical only with 1% lidocaine   Timeout Verified patient identification, verified procedure, site/side was marked, verified correct patient position, special equipment/implants available, medications/allergies/relevant history reviewed, required imaging and test results available.  Sterile Technique Maximal sterile technique including full sterile barrier drape, hand hygiene, sterile gown, sterile gloves, mask, hair covering, sterile ultrasound probe cover (if used).  Procedure Description Area of catheter insertion was cleaned with chlorhexidine and draped in sterile fashion.  Without real-time ultrasound guidance a central venous catheter was placed into the left subclavian vein. Nonpulsatile blood flow and easy flushing noted in all ports.  The catheter was sutured in place and sterile dressing applied.  Complications/Tolerance None; patient tolerated the procedure well. Chest X-ray is ordered to verify placement.  EBL Minimal  Specimen(s) None  Marcelle Smiling, MD Board Certified by the ABIM, Pulmonary Diseases & Critical Care Medicine

## 2020-04-27 NOTE — H&P (Signed)
NAME:  Alan Becker, MRN:  [id number], DOB:  10-Apr-1976, LOS: 0 ADMISSION DATE:  04/27/2020, CONSULTATION DATE:  7/8 REFERRING MD:  Tegeler, CHIEF COMPLAINT:  confusion   Brief History   44 y/o male who has been drinking heavily for several years presented to the [facility] ER complaining of confusion, weakness, change in skin color.  Noted to have evidence of liver and kidney failure, PCCM consulted for admission.  History of present illness   44 y/o male presented to the [facility] ED after his family noted him to be very confused and have skin color changes.  He has a history of depression and has been drinking heavily for the last 8 years since his divorce.  His mother gives most of the history because the patient is confused.  She says that he has been experiencing abdominal bloating/pain, fatigue and malaise for the last 3-4 weeks.  He passed some blood in his stool yesterday, none today.  No recent vomiting.  He took '800mg'$  ibuprofen yesterday to help with the abdominal pain.  Because of his confusion and skin changes his mother thought it would be best for him to be admitted to the hospital for further evaluation.  He has not had anything to drink in the last 3-4 weeks.  He vomited some initially after he quit drinking, but not much since then.  Appetite poor.  No fevers or chills.    Past Medical History  Alcoholism with heavy alcohol abuse Essential Hypertension  Significant Hospital Events   7/8 admission  Consults:  PCCM Nephrology  Procedures:  7/8 diagnostic paracentesis>   Significant Diagnostic Tests:    Micro Data:  7/8 peritoneal fluid>   Antimicrobials:  7/8 ceftriaxone >    Interim history/subjective:  As above  Objective   Blood pressure (!) 87/45, pulse 78, temperature (!) 97.4 F (36.3 C), temperature source Oral, resp. rate 15, height '5\' 8"'$  (1.727 m), weight 79.4 kg, SpO2 100 %.        Intake/Output Summary (Last 24 hours) at 04/27/2020 1701 Last data filed at  04/27/2020 1601 Gross per 24 hour  Intake 970 ml  Output --  Net 970 ml   Filed Weights   04/27/20 1321  Weight: 79.4 kg    Examination: Gen: jaundiced, mildly ill appearing, no acute distress HENT: NCAT, scleral icterus, OP clear, neck supple without masses Eyes: PERRL, EOMi Lymph: no cervical lymphadenopathy PULM: CTA B CV: RRR, no mgr, no JVD GI: BS+, buldging flanks, + fluid wave Derm: no rash or skin breakdown MSK: normal bulk and tone Neuro: awake, slow to answer questions, can follow commands, CN II-XII intact, strength 5/5 in all 4 extremities Psyche: normal mood and affect    Resolved Hospital Problem list     Assessment & Plan:  Acute decompensated alcoholic cirrhosis causing acute kidney injury, 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  Differential diagnosis includes ATN considering recent ibuprofen use but I think that is less likely. Hold narcotics No Tylenol Check urine sodium Check urine creatinine Check urinalysis Admit to ICU, may need hemodialysis Start octreotide infusion Start norepinephrine titrated to mean arterial pressure greater than 85 Consider albumin, check for spontaneous bacterial peritonitis first  Ascites/abdominal pain: Concern for spontaneous bacterial peritonitis Diagnostic paracentesis performed in the emergency room, follow-up as labs Continue empiric ceftriaxone for now Consider albumin infusion in setting of SBP and acute kidney injury  Alcoholic hepatitis, discriminant function 89 Start prednisone Monitor LFT  Depression: Holding home antidepressants at this time considering acute encephalopathy  Acute metabolic encephalopathy due to hepatic encephalopathy: Lactulose daily, titrate to 3 stools a day  Hypertension: Hold home antihypertensives at this time  Hematochezia 7/7 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 Monitor for bleeding Transfuse PRBC for Hgb < 7  gm/dL   Best practice:  Diet: regular diet Pain/Anxiety/Delirium protocol (if indicated): n/a VAP protocol (if indicated): n/a DVT prophylaxis: SCD GI prophylaxis: n/a Glucose control:monitor glucose with labs Mobility: bed rest Code Status: full Family Communication: mother updated bedside Disposition: remain in ICU  Labs   CBC: Recent Labs  Lab 04/27/20 1357  WBC 15.6*  NEUTROABS 11.7*  HGB 11.5*  HCT 30.7*  MCV 99.7  PLT 282    Basic Metabolic Panel: Recent Labs  Lab 04/27/20 1357  NA 135  K 3.5  CL 92*  CO2 24  GLUCOSE 77  BUN 64*  CREATININE 8.55*  CALCIUM 6.5*   GFR: Estimated Creatinine Clearance: 10.7 mL/min (A) (by C-G formula based on SCr of 8.55 mg/dL (H)). Recent Labs  Lab 04/27/20 1357  WBC 15.6*  LATICACIDVEN 2.9*    Liver Function Tests: Recent Labs  Lab 04/27/20 1357  AST 222*  ALT 64*  ALKPHOS 227*  BILITOT 23.9*  PROT 5.7*  ALBUMIN 1.9*   No results for input(s): LIPASE, AMYLASE in the last 168 hours. Recent Labs  Lab 04/27/20 1357  AMMONIA 64*    ABG No results found for: PHART, PCO2ART, PO2ART, HCO3, TCO2, ACIDBASEDEF, O2SAT   Coagulation Profile: Recent Labs  Lab 04/27/20 1357  INR 2.8*    Cardiac Enzymes: No results for input(s): CKTOTAL, CKMB, CKMBINDEX, TROPONINI in the last 168 hours.  HbA1C: No results found for: HGBA1C  CBG: No results for input(s): GLUCAP in the last 168 hours.  Review of Systems:   Cannot obtain due to confusion  Past Medical History  He,  has a past medical history of Depression, Hypertension, and Suicidal behavior.   Surgical History   History reviewed. No pertinent surgical history.   Social History   reports that he has been smoking cigarettes. He started smoking about 25 years ago. He has a 20.00 pack-year smoking history. He has never used smokeless tobacco. He reports current alcohol use of about 5.0 standard drinks of alcohol per week. He reports that he does not use  drugs.   Family History   His family history includes Arthritis in his mother; Depression in his mother. There is no history of Cancer, Diabetes, Drug abuse, Hearing loss, Early death, Heart disease, Hyperlipidemia, Hypertension, or Kidney disease.   Allergies Allergies  Allergen Reactions  . Penicillins Other (See Comments)    Did it involve swelling of the face/tongue/throat, SOB, or low BP? No Did it involve sudden or severe rash/hives, skin peeling, or any reaction on the inside of your mouth or nose? Rash Did you need to seek medical attention at a hospital or doctor's office? Yes When did it last happen?Child If all above answers are "NO", may proceed with cephalosporin use.       Home Medications  Prior to Admission medications   Medication Sig Start Date End Date Taking? Authorizing Provider  acetaminophen (TYLENOL) 500 MG tablet Take 500 mg by mouth every 6 (six) hours as needed for moderate pain.   Yes [provider]  ibuprofen (ADVIL) 200 MG tablet Take 200 mg by mouth every 6 (six) hours as needed for moderate pain.   Yes [provider]  irbesartan (AVAPRO) 300 MG tablet Take 1 tablet (300 mg total) by mouth daily. Patient taking differently: Take 300 mg by mouth every evening.  11/15/19  Yes Janith Lima, MD  Liver Extract (LIVER PO) Take 1 tablet by mouth daily.   Yes [provider]  Multiple Vitamins-Minerals (ZINC PO) Take 1 tablet by mouth daily.   Yes [provider]  temazepam (RESTORIL) 7.5 MG capsule Take 7.5 mg by mouth once.   Yes [provider]  TURMERIC PO Take 2 tablets by mouth at bedtime.   Yes [provider]  vitamin B-12 (CYANOCOBALAMIN) 100 MCG tablet Take 100 mcg by mouth daily.   Yes [provider]  Cholecalciferol 50 MCG (2000 UT) TABS Take 1 tablet (2,000 Units total) by mouth daily. Patient not taking: Reported on 04/27/2020 11/15/19   Janith Lima, MD  thiamine (VITAMIN  B-1) 100 MG tablet Take 1 tablet (100 mg total) by mouth daily. Patient not taking: Reported on 11/15/2019 04/28/18   Janith Lima, MD  traZODone (DESYREL) 150 MG tablet Take 1 tablet (150 mg total) by mouth at bedtime. Patient not taking: Reported on 04/27/2020 11/15/19   Janith Lima, MD  Vilazodone HCl (VIIBRYD STARTER PACK) 10 & 20 MG KIT Take 1 tablet by mouth daily. Patient not taking: Reported on 04/27/2020 11/15/19   Janith Lima, MD     Critical care time: 45 minutes    Roselie Awkward, MD [place] [facility] Pager: [phone] Cell: [phone] If no response, call [phone]

## 2020-04-27 NOTE — ED Triage Notes (Signed)
[facility] transported pt from home to [facility] ED and reports the following"  Pt drank around one fifth of gin daily for 3 weeks then stopped. Starting 2.5 weeks ago pt began feeling sick. Has been drinking water and Pedialyte. Urine has been a dark yellow, then turned brown today. Pt has visible jaundice, yellow sclera, distended abx, slowed speech, and short of breath when walking. Mother told EMS his speech is not slowed at baseline. NSR, 12 lead unremarkable.

## 2020-04-28 ENCOUNTER — Inpatient Hospital Stay (HOSPITAL_COMMUNITY): Payer: Medicaid Other

## 2020-04-28 DIAGNOSIS — Z515 Encounter for palliative care: Secondary | ICD-10-CM

## 2020-04-28 DIAGNOSIS — N179 Acute kidney failure, unspecified: Secondary | ICD-10-CM | POA: Diagnosis present

## 2020-04-28 LAB — RENAL FUNCTION PANEL
Albumin: 2.6 g/dL — ABNORMAL LOW (ref 3.5–5.0)
Albumin: 2.5 g/dL — ABNORMAL LOW (ref 3.5–5.0)
Anion gap: 22 — ABNORMAL HIGH (ref 5–15)
Anion gap: 19 — ABNORMAL HIGH (ref 5–15)
BUN: 64 mg/dL — ABNORMAL HIGH (ref 6–20)
BUN: 59 mg/dL — ABNORMAL HIGH (ref 6–20)
CO2: 20 mmol/L — ABNORMAL LOW (ref 22–32)
CO2: 20 mmol/L — ABNORMAL LOW (ref 22–32)
Calcium: 6.2 mg/dL — CL (ref 8.9–10.3)
Calcium: 6.4 mg/dL — CL (ref 8.9–10.3)
Chloride: 92 mmol/L — ABNORMAL LOW (ref 98–111)
Chloride: 97 mmol/L — ABNORMAL LOW (ref 98–111)
Creatinine, Ser: 9.18 mg/dL — ABNORMAL HIGH (ref 0.61–1.24)
Creatinine, Ser: 7.68 mg/dL — ABNORMAL HIGH (ref 0.61–1.24)
GFR calc Af Amer: 7 mL/min — ABNORMAL LOW (ref >60)
GFR calc Af Amer: 9 mL/min — ABNORMAL LOW (ref >60)
GFR calc non Af Amer: 6 mL/min — ABNORMAL LOW (ref >60)
GFR calc non Af Amer: 8 mL/min — ABNORMAL LOW (ref >60)
Glucose, Bld: 91 mg/dL (ref 70–99)
Glucose, Bld: 151 mg/dL — ABNORMAL HIGH (ref 70–99)
Phosphorus: 7.4 mg/dL — ABNORMAL HIGH (ref 2.5–4.6)
Phosphorus: 6.8 mg/dL — ABNORMAL HIGH (ref 2.5–4.6)
Potassium: 3.8 mmol/L (ref 3.5–5.1)
Potassium: 3.9 mmol/L (ref 3.5–5.1)
Sodium: 134 mmol/L — ABNORMAL LOW (ref 135–145)
Sodium: 136 mmol/L (ref 135–145)

## 2020-04-28 LAB — PHOSPHORUS: Phosphorus: 6.9 mg/dL — ABNORMAL HIGH (ref 2.5–4.6)

## 2020-04-28 LAB — URINALYSIS, ROUTINE W REFLEX MICROSCOPIC
Glucose, UA: 50 mg/dL — AB
Ketones, ur: NEGATIVE mg/dL
Leukocytes,Ua: NEGATIVE
Nitrite: POSITIVE — AB
Protein, ur: 100 mg/dL — AB
Specific Gravity, Urine: 1.019 (ref 1.005–1.030)
pH: 5 (ref 5.0–8.0)

## 2020-04-28 LAB — CBC
HCT: 28.2 % — ABNORMAL LOW (ref 39.0–52.0)
Hemoglobin: 10.6 g/dL — ABNORMAL LOW (ref 13.0–17.0)
MCH: 37.6 pg — ABNORMAL HIGH (ref 26.0–34.0)
MCHC: 37.6 g/dL — ABNORMAL HIGH (ref 30.0–36.0)
MCV: 100 fL (ref 80.0–100.0)
Platelets: 275 10*3/uL (ref 150–400)
RBC: 2.82 MIL/uL — ABNORMAL LOW (ref 4.22–5.81)
RDW: 21.5 % — ABNORMAL HIGH (ref 11.5–15.5)
WBC: 16.4 10*3/uL — ABNORMAL HIGH (ref 4.0–10.5)
nRBC: 0.1 % (ref 0.0–0.2)

## 2020-04-28 LAB — BASIC METABOLIC PANEL
Anion gap: 20 — ABNORMAL HIGH (ref 5–15)
BUN: 64 mg/dL — ABNORMAL HIGH (ref 6–20)
CO2: 20 mmol/L — ABNORMAL LOW (ref 22–32)
Calcium: 6.2 mg/dL — CL (ref 8.9–10.3)
Chloride: 94 mmol/L — ABNORMAL LOW (ref 98–111)
Creatinine, Ser: 8.56 mg/dL — ABNORMAL HIGH (ref 0.61–1.24)
GFR calc Af Amer: 8 mL/min — ABNORMAL LOW (ref >60)
GFR calc non Af Amer: 7 mL/min — ABNORMAL LOW (ref >60)
Glucose, Bld: 89 mg/dL (ref 70–99)
Potassium: 3.6 mmol/L (ref 3.5–5.1)
Sodium: 134 mmol/L — ABNORMAL LOW (ref 135–145)

## 2020-04-28 LAB — HIV ANTIBODY (ROUTINE TESTING W REFLEX): HIV Screen 4th Generation wRfx: NONREACTIVE

## 2020-04-28 LAB — HEPATIC FUNCTION PANEL
ALT: 53 U/L — ABNORMAL HIGH (ref 0–44)
AST: 183 U/L — ABNORMAL HIGH (ref 15–41)
Albumin: 2.5 g/dL — ABNORMAL LOW (ref 3.5–5.0)
Alkaline Phosphatase: 165 U/L — ABNORMAL HIGH (ref 38–126)
Bilirubin, Direct: 19 mg/dL — ABNORMAL HIGH (ref 0.0–0.2)
Indirect Bilirubin: 6 mg/dL — ABNORMAL HIGH (ref 0.3–0.9)
Total Bilirubin: 25 mg/dL (ref 0.3–1.2)
Total Protein: 5.7 g/dL — ABNORMAL LOW (ref 6.5–8.1)

## 2020-04-28 LAB — MAGNESIUM
Magnesium: 1.2 mg/dL — ABNORMAL LOW (ref 1.7–2.4)
Magnesium: 1.3 mg/dL — ABNORMAL LOW (ref 1.7–2.4)

## 2020-04-28 LAB — SODIUM, URINE, RANDOM: Sodium, Ur: 20 mmol/L

## 2020-04-28 LAB — CREATININE, URINE, RANDOM: Creatinine, Urine: 231.33 mg/dL

## 2020-04-28 MED ORDER — VITAMIN K1 10 MG/ML IJ SOLN
10.0000 mg | Freq: Once | INTRAMUSCULAR | Status: AC
  Administered 2020-04-28: 10 mg via SUBCUTANEOUS
  Filled 2020-04-28: qty 1

## 2020-04-28 MED ORDER — SODIUM CHLORIDE 0.9 % IV BOLUS
500.0000 mL | Freq: Four times a day (QID) | INTRAVENOUS | Status: AC
  Administered 2020-04-28 (×2): 500 mL via INTRAVENOUS

## 2020-04-28 MED ORDER — ALTEPLASE 2 MG IJ SOLR: 2.0000 mg | Freq: Once | INTRAMUSCULAR | Status: DC | PRN

## 2020-04-28 MED ORDER — SODIUM CHLORIDE 0.9 % FOR CRRT: INTRAVENOUS_CENTRAL | Status: DC | PRN

## 2020-04-28 MED ORDER — PRISMASOL BGK 4/2.5 32-4-2.5 MEQ/L REPLACEMENT SOLN: Status: DC

## 2020-04-28 MED ORDER — HYDROMORPHONE HCL 1 MG/ML PO LIQD
1.0000 mg | Freq: Four times a day (QID) | ORAL | Status: DC | PRN
  Administered 2020-04-28 – 2020-04-29 (×3): 1 mg via ORAL
  Filled 2020-04-28 (×3): qty 1

## 2020-04-28 MED ORDER — CALCIUM GLUCONATE-NACL 1-0.675 GM/50ML-% IV SOLN
1.0000 g | Freq: Once | INTRAVENOUS | Status: AC
  Administered 2020-04-28: 1000 mg via INTRAVENOUS
  Filled 2020-04-28: qty 50

## 2020-04-28 MED ORDER — HEPARIN SODIUM (PORCINE) 1000 UNIT/ML DIALYSIS
1000.0000 [IU] | INTRAMUSCULAR | Status: DC | PRN
  Administered 2020-04-30: 2000 [IU] via INTRAVENOUS_CENTRAL
  Filled 2020-04-28 (×2): qty 6

## 2020-04-28 MED ORDER — CALCIUM GLUCONATE-NACL 2-0.675 GM/100ML-% IV SOLN
2.0000 g | Freq: Once | INTRAVENOUS | Status: AC
  Administered 2020-04-28: 2000 mg via INTRAVENOUS
  Filled 2020-04-28: qty 100

## 2020-04-28 MED ORDER — PRISMASOL BGK 4/2.5 32-4-2.5 MEQ/L IV SOLN: INTRAVENOUS | Status: DC

## 2020-04-28 NOTE — Progress Notes (Signed)
eLink Physician-Brief Progress Note Patient Name: Alan Becker DOB: April 13, 1976 MRN: [id number]   Date of Service  04/28/2020  HPI/Events of Note  Order to transfuse 2 units of FFP would not cross over to the [facility], INR  2.8, No overt bleeding currently.  eICU Interventions  FFP order discontinued, Vitamin K 10 mg SQ ordered to counter any nutritional component to increased INR.        Alan Becker Teea Ducey 04/28/2020, 12:21 AM

## 2020-04-28 NOTE — Progress Notes (Signed)
Blood pressure readings are positional.  When patient lies on his back with his arm straight, MAP goal is achieved.  Often during the night, the patient slept on his side with arms folded.  When his blood pressure was taken in that position, the MAP was less than goal.

## 2020-04-28 NOTE — Plan of Care (Signed)
  Problem: Education: Goal: Knowledge of General Education information will improve Description: Including pain rating scale, medication(s)/side effects and non-pharmacologic comfort measures Outcome: Not Progressing   Problem: Health Behavior/Discharge Planning: Goal: Ability to manage health-related needs will improve Outcome: Not Progressing   Problem: Nutrition: Goal: Adequate nutrition will be maintained Outcome: Progressing   Problem: Pain Managment: Goal: General experience of comfort will improve Outcome: Not Progressing   Problem: Skin Integrity: Goal: Risk for impaired skin integrity will decrease Outcome: Progressing

## 2020-04-28 NOTE — TOC Progression Note (Signed)
Transition of Care [facility]) - Progression Note    Patient Details  Name: Alan Becker MRN: [id number] Date of Birth: 07/14/1976  Transition of Care [facility]) CM/SW Contact  Golda Acre, RN Phone Number: 04/28/2020, 10:06 AM  Clinical Narrative:    Acute decompensated alcoholic cirrhosis causing acute kidney injury, 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  Differential diagnosis includes ATN considering recent ibuprofen use    Expected Discharge Plan: Home/Self Care Barriers to Discharge: Continued Medical Work up  Expected Discharge Plan and Services Expected Discharge Plan: Home/Self Care In-house Referral: Clinical Social Work Discharge Planning Services: CM Consult   Living arrangements for the past 2 months: Apartment                                       Social Determinants of Health (SDOH) Interventions    Readmission Risk Interventions No flowsheet data found.

## 2020-04-28 NOTE — Progress Notes (Signed)
[facility] Progress Note  Alan Becker 44 y.o. Aug 27, 1976  CC:  Liver failure  Subjective: Patient reports feeling poorly this morning.  Reports diffuse abdominal discomfort.  Has not had a bowel movement since admission.  ROS : Review of Systems  Cardiovascular: Negative for chest pain and palpitations.  Gastrointestinal: Positive for abdominal pain and constipation. Negative for blood in stool, diarrhea, heartburn, melena, nausea and vomiting.   Objective: Vital signs in last 24 hours: Vitals:   04/28/20 1130 04/28/20 1200  BP: 110/64   Pulse: 81   Resp: (!) 22   Temp:  (!) 97.4 F (36.3 C)  SpO2: 94%     Physical Exam:  General:  Alert, oriented, jaundiced, ill-appearing, no acute distress  Head:  Normocephalic, without obvious abnormality, atraumatic  Eyes:  Deep icterus, EOMs intact  Lungs:   Breathing comfortably on room air  Heart:  Regular rate and rhythm  Abdomen:   Moderately distended and firm with diffuse tenderness and guarding to palpation, mildly sluggish bowel sounds.  Extremities: Extremities normal, atraumatic, no  edema  Pulses: 2+ and symmetric    Lab Results: Recent Labs    04/27/20 1357 04/27/20 2350 04/28/20 0544  NA 135  --  134*  134*  K 3.5  --  3.8  3.6  CL 92*  --  92*  94*  CO2 24  --  20*  20*  GLUCOSE 77  --  91  89  BUN 64*  --  64*  64*  CREATININE 8.55*  --  9.18*  8.56*  CALCIUM 6.5*  --  6.2*  6.2*  MG  --  1.3* 1.2*  PHOS  --   --  7.4*  6.9*   Recent Labs    04/27/20 1357 04/28/20 0544  AST 222* 183*  ALT 64* 53*  ALKPHOS 227* 165*  BILITOT 23.9* 25.0*  PROT 5.7* 5.7*  ALBUMIN 1.9* 2.5*  2.6*   Recent Labs    04/27/20 1357 04/28/20 0544  WBC 15.6* 16.4*  NEUTROABS 11.7*  --   HGB 11.5* 10.6*  HCT 30.7* 28.2*  MCV 99.7 100.0  PLT 282 275   Recent Labs    04/27/20 1357  LABPROT 28.8*  INR 2.8*   Assessment: Decompensated cirrhosis with liver failure -T bili 25.0/AST 183/ALT  53/ALP 165 as compared to yesterday T bili 23.9/AST 222/ALT 64/ALP 227 -Diagnostic paracentesis yesterday 7/8.  Ascitic fluid albumin <1, no SBP, culture negative -INR elevated to 2.8 as of 7/8 -Ammonia elevated to 64 as of 7/8 but patient not currently encephalopathic  Hepatorenal syndrome: BUN 64/Cr 9.18 as compared to BUN 64/Cr 8.56 yesterday  Plan: Not currently a candidate for therapeutic paracentesis due to renal function.  Continue lactulose daily.  If no bowel movement, increase to BID dosing.  Titrate for 2-3 soft stools per day.  Continue supportive care.    [facility] will follow.  Edrick Kins PA-C 04/28/2020, 1:22 PM  Contact #  [phone]

## 2020-04-28 NOTE — Progress Notes (Signed)
[place] Progress Note  Subjective: seen in room, no UOP w/ condom cath today.  Bladder scan 200 cc.   Vitals:   04/28/20 0915 04/28/20 0930 04/28/20 1000 04/28/20 1010  BP: (!) 97/58 107/63 (!) 98/42 (!) 95/53  Pulse: 94 93 91 84  Resp:      Temp:      TempSrc:      SpO2: (!) 88% 95% 95% 96%  Weight:      Height:        Exam:   alert, nad   no jvd  Chest cta bilat  Cor reg no RG  Abd soft ntnd no ascites   Ext no LE edema   Alert, NF, ox3    UA not done yet  UNa, UCr not done yet        Creat 8.5 yest     Creat 9.18 this am, BUN 64 K 3.8  CO2 20   Assessment/Plan: 1.  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 Given that this is a diagnosis of exclusion need to check urinalysis, urine electrolytes. Also decreased oral intake, NSAIDs and recent ARB use may be contributing.  He has a moderate amount of ascites and we recommend therapeutic paracentesis of 3 to 4 L with concomitant administration of intravenous albumin 25%-75 g for symptomatic relief and possibly decrease intra-abdominal pressure/improve renal perfusion  - started on IV levophed (goal ^map by 10) and octreotide gtt's  - daily IV albumin 1gm/kg yesterday and today  - renal function worsening today > discussed w/ family and pt choices are hospice vs RRT.  They would like aggressive care at this time, plan CRRT  - place foley cath  - NS bolus 500 cc x 2, keep + 50/ hr w/ CRRT  2.  Acute decompensated alcoholic hepatitis: Avoid Tylenol/additional hepatotoxins and agree with efforts at improving hemodynamic status with intravenous Levophed/octreotide and albumin.   He has been seen by Dr. Bosie Clos of gastroenterology and his note reviewed; currently not a candidate for liver transplantation. 3.  Acute metabolic encephalopathy: Likely secondary to hepatic encephalopathy based on elevated ammonia level with possibly some impact from azotemia.  On lactulose. 4.  Hypotension:  Discontinued ARB/oral antihypertensives at this time and continue Levophed with possible transition to midodrine     Rob Yaxiel Minnie 04/28/2020, 10:41 AM   Recent Labs  Lab 04/27/20 1357 04/28/20 0544  K 3.5 3.8  3.6  BUN 64* 64*  64*  CREATININE 8.55* 9.18*  8.56*  CALCIUM 6.5* 6.2*  6.2*  PHOS  --  7.4*  6.9*  HGB 11.5* 10.6*   Inpatient medications: . sodium chloride   Intravenous Once  . Chlorhexidine Gluconate Cloth  6 each Topical Q0600  . lactulose  30 g Oral Daily  . mouth rinse  15 mL Mouth Rinse BID  . prednisoLONE  30 mg Oral Q breakfast  . thiamine  100 mg Oral Daily   . sodium chloride    . cefTRIAXone (ROCEPHIN)  IV    . norepinephrine (LEVOPHED) Adult infusion 16 mcg/min (04/28/20 0915)  . octreotide  (SANDOSTATIN)    IV infusion 50 mcg/hr (04/28/20 0900)   docusate sodium, HYDROmorphone HCl, polyethylene glycol

## 2020-04-28 NOTE — Progress Notes (Signed)
NAME:  Alan Becker, MRN:  [id number], DOB:  [birth year], LOS: 1 ADMISSION DATE:  04/27/2020, CONSULTATION DATE:  7/8 REFERRING MD:  Tegeler, CHIEF COMPLAINT:  confusion   Brief History   44 y/o male who has been drinking heavily for several years presented to the [facility] ER complaining of confusion, weakness, change in skin color.  Noted to have evidence of liver and kidney failure, PCCM consulted for admission.  History of present illness   44 y/o male presented to the [facility] ED after his family noted him to be very confused and have skin color changes.  He has a history of depression and has been drinking heavily for the last 8 years since his divorce.  His mother gives most of the history because the patient is confused.  She says that he has been experiencing abdominal bloating/pain, fatigue and malaise for the last 3-4 weeks.  He passed some blood in his stool yesterday, none today.  No recent vomiting.  He took 800mg  ibuprofen yesterday to help with the abdominal pain.  Because of his confusion and skin changes his mother thought it would be best for him to be admitted to the hospital for further evaluation.  He has not had anything to drink in the last 3-4 weeks.  He vomited some initially after he quit drinking, but not much since then.  Appetite poor.  No fevers or chills.    Past Medical History  Alcoholism with heavy alcohol abuse Essential Hypertension  Significant Hospital Events   7/8 admission  Consults:  PCCM Nephrology  Procedures:  7/8 diagnostic paracentesis> 30 cc of dark yellow fluid aspirated  Significant Diagnostic Tests:    Micro Data:  7/8 peritoneal fluid> no organism so far  Antimicrobials:  7/8 ceftriaxone >    Interim history/subjective:  As above Still with pain and discomfort, shortness of breath  Objective   Blood pressure 110/64, pulse 81, temperature (!) 97.4 F (36.3 C), temperature source Oral, resp. rate (!) 22, height 5\' 8"  (1.727 m), weight 79.4  kg, SpO2 94 %.        Intake/Output Summary (Last 24 hours) at 04/28/2020 1511 Last data filed at 04/28/2020 1100 Gross per 24 hour  Intake 2885.5 ml  Output 200 ml  Net 2685.5 ml   Filed Weights   04/27/20 1321  Weight: 79.4 kg    Examination: Gen: jaundiced, appears ill HENT: NCAT, scleral icterus Eyes: PERRL, EOMi Lymph: no cervical lymphadenopathy PULM: Clear to auscultation CV: RRR, no mgr, no JVD GI: BS+, buldging flanks, + fluid wave Derm: no rash or skin breakdown MSK: normal bulk and tone Neuro: Awake and alert Psyche: normal mood and affect    Resolved Hospital Problem list     Assessment & Plan:  Acute decompensated alcoholic cirrhosis causing acute kidney injury, 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  Differential diagnosis includes ATN considering recent ibuprofen use but I think that is less likely. Hold narcotics No Tylenol Check urine sodium Check urine creatinine Check urinalysis Had HD catheter placed for CRRT initiation Start octreotide infusion Continue norepinephrine to maintain MAP greater than 85 Nucleated cells in fluid 166  Ascites/abdominal pain: Concern for spontaneous bacterial peritonitis Diagnostic paracentesis performed in the emergency room,-no organism, nucleated cells less than 250 Continue empiric ceftriaxone for now Consider albumin infusion in setting of SBP and acute kidney injury  Alcoholic hepatitis, discriminant function 89 Continue prednisone Monitor LFT  Depression: Holding home antidepressants at  this time considering acute encephalopathy  Acute metabolic encephalopathy due to hepatic encephalopathy: Lactulose daily, titrate to 3 stools a day   Hypertension: Hold home antihypertensives at this time  Hematochezia 7/7 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 Monitor for bleeding Transfuse PRBC for Hgb < 7 gm/dL   Best practice:  Diet: regular  diet Pain/Anxiety/Delirium protocol (if indicated): Dilaudid as needed VAP protocol (if indicated): Not indicated DVT prophylaxis: SCD GI prophylaxis:  Glucose control:monitor glucose with labs Mobility: bed rest Code Status: full Family Communication: mother updated bedside 7/8 Disposition: remain in ICU  Labs   CBC: Recent Labs  Lab 04/27/20 1357 04/28/20 0544  WBC 15.6* 16.4*  NEUTROABS 11.7*  --   HGB 11.5* 10.6*  HCT 30.7* 28.2*  MCV 99.7 100.0  PLT 282 275    Basic Metabolic Panel: Recent Labs  Lab 04/27/20 1357 04/27/20 2350 04/28/20 0544  NA 135  --  134*  134*  K 3.5  --  3.8  3.6  CL 92*  --  92*  94*  CO2 24  --  20*  20*  GLUCOSE 77  --  91  89  BUN 64*  --  64*  64*  CREATININE 8.55*  --  9.18*  8.56*  CALCIUM 6.5*  --  6.2*  6.2*  MG  --  1.3* 1.2*  PHOS  --   --  7.4*  6.9*   GFR: Estimated Creatinine Clearance: 9.9 mL/min (A) (by C-G formula based on SCr of 9.18 mg/dL (H)). Recent Labs  Lab 04/27/20 1357 04/27/20 1650 04/28/20 0544  WBC 15.6*  --  16.4*  LATICACIDVEN 2.9* 3.0*  --     Liver Function Tests: Recent Labs  Lab 04/27/20 1357 04/28/20 0544  AST 222* 183*  ALT 64* 53*  ALKPHOS 227* 165*  BILITOT 23.9* 25.0*  PROT 5.7* 5.7*  ALBUMIN 1.9* 2.5*  2.6*   No results for input(s): LIPASE, AMYLASE in the last 168 hours. Recent Labs  Lab 04/27/20 1357  AMMONIA 64*    ABG No results found for: PHART, PCO2ART, PO2ART, HCO3, TCO2, ACIDBASEDEF, O2SAT   Coagulation Profile: Recent Labs  Lab 04/27/20 1357  INR 2.8*    Review of Systems:   Cannot obtain due to confusion  Past Medical History  He,  has a past medical history of Depression, Hypertension, and Suicidal behavior.   Surgical History   History reviewed. No pertinent surgical history.   Social History   reports that he has been smoking cigarettes. He started smoking about 25 years ago. He has a 20.00 pack-year smoking history. He has never used  smokeless tobacco. He reports current alcohol use of about 5.0 standard drinks of alcohol per week. He reports that he does not use drugs.   Family History   His family history includes Arthritis in his mother; Depression in his mother. There is no history of Cancer, Diabetes, Drug abuse, Hearing loss, Early death, Heart disease, Hyperlipidemia, Hypertension, or Kidney disease.   Allergies Allergies  Allergen Reactions  . Penicillins Other (See Comments)    Did it involve swelling of the face/tongue/throat, SOB, or low BP? No Did it involve sudden or severe rash/hives, skin peeling, or any reaction on the inside of your mouth or nose? Rash Did you need to seek medical attention at a hospital or doctor's office? Yes When did it last happen?Child If all above answers are "NO", may proceed with cephalosporin use.      The patient  is critically ill with multiple organ systems failure and requires high complexity decision making for assessment and support, frequent evaluation and titration of therapies, application of advanced monitoring technologies and extensive interpretation of multiple databases. Critical Care Time devoted to patient care services described in this note independent of APP/resident time (if applicable)  is 35 minutes.   Virl Diamond MD [facility] Personal pager: [phone] If unanswered, please page CCM On-call: #[phone]

## 2020-04-28 NOTE — Progress Notes (Signed)
CRITICAL VALUE ALERT  Critical Value:  Calcium 6.2  Date & Time Notied:  04/28/20 @ 0650  Provider Notified: E-Link Nurse    Orders Received/Actions taken: Awaiting orders

## 2020-04-28 NOTE — Progress Notes (Signed)
eLink Physician-Brief Progress Note Patient Name: Alan Becker DOB: 03-11-1976 MRN: [id number]   Date of Service  04/28/2020  HPI/Events of Note  Serum calcium 6.4, serum Albumin 2.5, corrected calcium 7.6  eICU Interventions  Calcium gluconate 2 gm iv bolus x 1 ordered.        Thomasene Lot Desirre Eickhoff 04/28/2020, 11:45 PM

## 2020-04-28 NOTE — Procedures (Signed)
Central Venous Catheter Insertion Procedure Note  Martyn Timme  [id number]  [birth year]  Date:04/28/20  Time:1:56 PM   Provider Performing:Pete Bea Laura Tanja Port   Procedure: Insertion of Non-tunneled Central Venous Catheter(36556)with US guidance (38182)    Indication(s) Medication administration and Hemodialysis  Consent Risks of the procedure as well as the alternatives and risks of each were explained to the patient and/or caregiver.  Consent for the procedure was obtained and is signed in the bedside chart  Anesthesia Topical only with 1% lidocaine   Timeout Verified patient identification, verified procedure, site/side was marked, verified correct patient position, special equipment/implants available, medications/allergies/relevant history reviewed, required imaging and test results available.  Sterile Technique Maximal sterile technique including full sterile barrier drape, hand hygiene, sterile gown, sterile gloves, mask, hair covering, sterile ultrasound probe cover (if used).  Procedure Description Area of catheter insertion was cleaned with chlorhexidine and draped in sterile fashion.   With real-time ultrasound guidance a HD catheter was placed into the right internal jugular vein.  Nonpulsatile blood flow and easy flushing noted in all ports.  The catheter was sutured in place and sterile dressing applied.  Complications/Tolerance None; patient tolerated the procedure well. Chest X-ray is ordered to verify placement for internal jugular or subclavian cannulation.  Chest x-ray is not ordered for femoral cannulation.  EBL Minimal  Specimen(s) None  Simonne Martinet ACNP-BC [facility] Pulmonary/Critical Care Pager # [phone] OR # [phone] if no answer

## 2020-04-28 NOTE — Progress Notes (Signed)
CRITICAL VALUE ALERT  Critical Value:  Calcium 6.4  Date & Time Notied:    Provider Notified: Renne Musca MD  Orders Received/Actions taken: Calcium trending up. Continue to trend electrolytes.

## 2020-04-29 LAB — CALCIUM, IONIZED: Calcium, Ionized, Serum: 3.3 mg/dL — ABNORMAL LOW (ref 4.5–5.6)

## 2020-04-29 LAB — RENAL FUNCTION PANEL
Albumin: 2.4 g/dL — ABNORMAL LOW (ref 3.5–5.0)
Albumin: 2.2 g/dL — ABNORMAL LOW (ref 3.5–5.0)
Anion gap: 12 (ref 5–15)
Anion gap: 8 (ref 5–15)
BUN: 36 mg/dL — ABNORMAL HIGH (ref 6–20)
BUN: 25 mg/dL — ABNORMAL HIGH (ref 6–20)
CO2: 23 mmol/L (ref 22–32)
CO2: 25 mmol/L (ref 22–32)
Calcium: 7 mg/dL — ABNORMAL LOW (ref 8.9–10.3)
Calcium: 6.6 mg/dL — ABNORMAL LOW (ref 8.9–10.3)
Chloride: 98 mmol/L (ref 98–111)
Chloride: 102 mmol/L (ref 98–111)
Creatinine, Ser: 4.41 mg/dL — ABNORMAL HIGH (ref 0.61–1.24)
Creatinine, Ser: 2.85 mg/dL — ABNORMAL HIGH (ref 0.61–1.24)
GFR calc Af Amer: 18 mL/min — ABNORMAL LOW (ref >60)
GFR calc Af Amer: 30 mL/min — ABNORMAL LOW (ref >60)
GFR calc non Af Amer: 15 mL/min — ABNORMAL LOW (ref >60)
GFR calc non Af Amer: 26 mL/min — ABNORMAL LOW (ref >60)
Glucose, Bld: 117 mg/dL — ABNORMAL HIGH (ref 70–99)
Glucose, Bld: 101 mg/dL — ABNORMAL HIGH (ref 70–99)
Phosphorus: 4.7 mg/dL — ABNORMAL HIGH (ref 2.5–4.6)
Phosphorus: 3.5 mg/dL (ref 2.5–4.6)
Potassium: 3.8 mmol/L (ref 3.5–5.1)
Potassium: 3.6 mmol/L (ref 3.5–5.1)
Sodium: 133 mmol/L — ABNORMAL LOW (ref 135–145)
Sodium: 135 mmol/L (ref 135–145)

## 2020-04-29 LAB — MAGNESIUM: Magnesium: 2 mg/dL (ref 1.7–2.4)

## 2020-04-29 LAB — APTT: aPTT: 54 seconds — ABNORMAL HIGH (ref 24–36)

## 2020-04-29 MED ORDER — ONDANSETRON HCL 4 MG/2ML IJ SOLN
4.0000 mg | Freq: Once | INTRAMUSCULAR | Status: AC
  Administered 2020-04-29: 4 mg via INTRAVENOUS
  Filled 2020-04-29: qty 2

## 2020-04-29 MED ORDER — ONDANSETRON HCL 4 MG/2ML IJ SOLN
4.0000 mg | Freq: Three times a day (TID) | INTRAMUSCULAR | Status: DC | PRN
  Administered 2020-04-29 – 2020-05-06 (×3): 4 mg via INTRAVENOUS
  Filled 2020-04-29 (×3): qty 2

## 2020-04-29 MED ORDER — ALUM & MAG HYDROXIDE-SIMETH 200-200-20 MG/5ML PO SUSP
30.0000 mL | ORAL | Status: DC | PRN
  Administered 2020-04-29 – 2020-05-03 (×6): 30 mL via ORAL
  Filled 2020-04-29 (×8): qty 30

## 2020-04-29 MED ORDER — HYDROMORPHONE HCL 1 MG/ML IJ SOLN
1.0000 mg | INTRAMUSCULAR | Status: DC | PRN
  Administered 2020-04-29 – 2020-05-01 (×6): 1 mg via INTRAVENOUS
  Filled 2020-04-29 (×6): qty 1

## 2020-04-29 NOTE — Progress Notes (Addendum)
eLink Physician-Brief Progress Note Patient Name: Alan Becker DOB: 03/02/1976 MRN: [id number]   Date of Service  04/29/2020  HPI/Events of Note  Patient c/o nausea.  eICU Interventions  Zofran 4 mg iv x 1        Alan Becker 04/29/2020, 5:41 AM

## 2020-04-29 NOTE — Progress Notes (Signed)
[facility] Progress Note  Subjective: seen in room, feels a little better, abd less swollen.  Foley draining dark brown urine.   Vitals:   04/29/20 0930 04/29/20 1000 04/29/20 1100 04/29/20 1200  BP: (!) 137/99 116/87 (!) 117/92 119/80  Pulse: 88 83 78 76  Resp: (!) 21 11 12 13   Temp:    (!) 97.2 F (36.2 C)  TempSrc:    Oral  SpO2: 99% 94% 94% 95%  Weight:      Height:        Exam:   alert, nad   no jvd  Chest cta bilat  Cor reg no RG  Abd soft ntnd no ascites   Ext no LE edema   Alert, NF, ox3    UA 7/9 - hazy, amber, 50 glu, mod Hb, +nit, 5.0, 100 prot, 0-5 rbc/ 11-20 wbc   UNa 7/9 - 20   UCr  7/9 - 231   FeNa low at 0.3%   Assessment/Plan: 1.  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 Other possibility is bile- inducted tubular injury w/ ATN. Also decreased oral intake, NSAIDs and recent ARB use may be contributing.  He has a moderate amount of ascites and we recommend therapeutic paracentesis of 3 to 4 L with concomitant administration of intravenous albumin 25%-75 g for symptomatic relief and possibly decrease intra-abdominal pressure/improve renal perfusion  - started on IV levophed (goal ^map by 10) and octreotide gtt's  - daily IV albumin 1gm/kg given x 1 on 7/8 and 7/9  - urine lytes c/w HRS, FeNa < 1%  - doing well on CRRT, D#2 today  - if paracentesis done pull max 3- 4 L + give concomitant 75gm IV alb 25%  - volume stable, no edema, cont 50 cc/hr + on CRRT, BP's improved   - pressors down to 8 ug/ min levo today  - having some UOP 10 cc/hr  2.  Acute decompensated alcoholic hepatitis: Avoid Tylenol/additional hepatotoxins and agree with efforts at improving hemodynamic status with intravenous Levophed/octreotide and albumin.   He has been seen by Dr. 9/9 of gastroenterology and his note reviewed; currently not a candidate for liver transplantation. 3.  Acute metabolic encephalopathy: Likely secondary to hepatic  encephalopathy based on elevated ammonia level with possibly some impact from azotemia.  On lactulose. 4.  Hypotension: Discontinued ARB/oral antihypertensives at this time and continue Levophed with possible transition to midodrine     Rob Darnise Montag 04/29/2020, 12:47 PM   Recent Labs  Lab 04/27/20 1357 04/27/20 1357 04/28/20 0544 04/28/20 0544 04/28/20 1600 04/29/20 0451  K 3.5   < > 3.8  3.6   < > 3.9 3.8  BUN 64*   < > 64*  64*   < > 59* 36*  CREATININE 8.55*   < > 9.18*  8.56*   < > 7.68* 4.41*  CALCIUM 6.5*   < > 6.2*  6.2*   < > 6.4* 7.0*  PHOS  --   --  7.4*  6.9*   < > 6.8* 4.7*  HGB 11.5*  --  10.6*  --   --   --    < > = values in this interval not displayed.   Inpatient medications: . sodium chloride   Intravenous Once  . Chlorhexidine Gluconate Cloth  6 each Topical Q0600  . lactulose  30 g Oral Daily  . mouth rinse  15 mL Mouth Rinse BID  . prednisoLONE  30 mg  Oral Q breakfast  . thiamine  100 mg Oral Daily   .  prismasol BGK 4/2.5 400 mL/hr at 04/28/20 1430  .  prismasol BGK 4/2.5 200 mL/hr at 04/28/20 1431  . sodium chloride 15 mL/hr at 04/29/20 1200  . cefTRIAXone (ROCEPHIN)  IV Stopped (04/28/20 1651)  . norepinephrine (LEVOPHED) Adult infusion 8 mcg/min (04/29/20 1200)  . octreotide  (SANDOSTATIN)    IV infusion 50 mcg/hr (04/29/20 1200)  . prismasol BGK 4/2.5 1,800 mL/hr at 04/29/20 1014   alteplase, alum & mag hydroxide-simeth, docusate sodium, heparin, HYDROmorphone (DILAUDID) injection, ondansetron (ZOFRAN) IV, polyethylene glycol, sodium chloride

## 2020-04-29 NOTE — Progress Notes (Signed)
NAME:  Alan Becker, MRN:  [id number], DOB:  July 22, 1976, LOS: 2 ADMISSION DATE:  04/27/2020, CONSULTATION DATE:  7/8 REFERRING MD:  Tegeler, CHIEF COMPLAINT:  confusion   Brief History   44 y/o male who has been drinking heavily for several years presented to the [facility] ER complaining of confusion, weakness, change in skin color.  Noted to have evidence of liver and kidney failure, PCCM consulted for admission.  History of present illness   44 y/o male presented to the [facility] ED after his family noted him to be very confused and have skin color changes.  He has a history of depression and has been drinking heavily for the last 8 years since his divorce.  His mother gives most of the history because the patient is confused.  She says that he has been experiencing abdominal bloating/pain, fatigue and malaise for the last 3-4 weeks.  He passed some blood in his stool yesterday, none today.  No recent vomiting.  He took 800mg  ibuprofen yesterday to help with the abdominal pain.  Because of his confusion and skin changes his mother thought it would be best for him to be admitted to the hospital for further evaluation.  He has not had anything to drink in the last 3-4 weeks.  He vomited some initially after he quit drinking, but not much since then.  Appetite poor.  No fevers or chills.    Past Medical History  Alcoholism with heavy alcohol abuse Essential Hypertension  Significant Hospital Events   7/8 admission 7/9 started CRRT Consults:  PCCM Nephrology  Procedures:  7/8 diagnostic paracentesis> 30 cc of dark yellow fluid aspirated  Significant Diagnostic Tests:    Micro Data:  7/8 peritoneal fluid> no organism so far  Antimicrobials:  7/8 ceftriaxone >    Interim history/subjective:   Still with pain and discomfort, shortness of breath is a little bit better  Objective   Blood pressure 113/68, pulse 77, temperature (!) 97.2 F (36.2 C), temperature source Oral, resp. rate 12, height  5\' 8"  (1.727 m), weight 87.7 kg, SpO2 94 %.        Intake/Output Summary (Last 24 hours) at 04/29/2020 1614 Last data filed at 04/29/2020 1600 Gross per 24 hour  Intake 3576.42 ml  Output 1216 ml  Net 2360.42 ml   Filed Weights   04/27/20 1321 04/29/20 0500  Weight: 79.4 kg 87.7 kg    Examination: Gen: jaundiced, appears ill HENT: Scleral icterus, pale Eyes: PERRL, EOMi  Lymph: No adenopathy PULM: Clear to auscultation, no rales CV: RRR, no mgr, no JVD GI: BS+, buldging flanks, + fluid wave Derm: Jaundice MSK: normal bulk and tone Neuro: Awake and alert Psyche: normal mood and affect  Resolved Hospital Problem list     Assessment & Plan:  Acute decompensated alcoholic cirrhosis causing acute kidney injury, 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  Differential diagnosis includes ATN considering recent ibuprofen use but I think that is less likely. Hold narcotics No Tylenol Started CRRT Continue octreotide infusion note Continue norepinephrine to maintain MAP greater than 85  Nucleated cells in fluid 166  Ascites/abdominal pain: Concern for spontaneous bacterial peritonitis Diagnostic paracentesis performed in the emergency room,-no organism, nucleated cells less than 250 We will discontinue ceftriaxone Abdominal discomfort is a lot better Consider albumin infusion in setting of SBP and acute kidney injury  Alcoholic hepatitis, discriminant function 89 Continue prednisone Monitor LFT  Depression: Holding home antidepressants at this  time considering acute encephalopathy  Acute metabolic encephalopathy due to hepatic encephalopathy: Lactulose daily, titrate to 3 stools a day   Hypertension: Hold home antihypertensives at this time  Hematochezia 7/7 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 Monitor for bleeding Transfuse PRBC for Hgb < 7 gm/dL   Best practice:  Diet: regular  diet Pain/Anxiety/Delirium protocol (if indicated): Dilaudid as needed VAP protocol (if indicated): Not indicated DVT prophylaxis: SCD GI prophylaxis:  Glucose control:monitor glucose with labs Mobility: bed rest Code Status: full Family Communication: Discussed with patient Disposition: remain in ICU  Labs   CBC: Recent Labs  Lab 04/27/20 1357 04/28/20 0544  WBC 15.6* 16.4*  NEUTROABS 11.7*  --   HGB 11.5* 10.6*  HCT 30.7* 28.2*  MCV 99.7 100.0  PLT 282 275    Basic Metabolic Panel: Recent Labs  Lab 04/27/20 1357 04/27/20 2350 04/28/20 0544 04/28/20 1600 04/29/20 0451  NA 135  --  134*  134* 136 133*  K 3.5  --  3.8  3.6 3.9 3.8  CL 92*  --  92*  94* 97* 98  CO2 24  --  20*  20* 20* 23  GLUCOSE 77  --  91  89 151* 117*  BUN 64*  --  64*  64* 59* 36*  CREATININE 8.55*  --  9.18*  8.56* 7.68* 4.41*  CALCIUM 6.5*  --  6.2*  6.2* 6.4* 7.0*  MG  --  1.3* 1.2*  --  2.0  PHOS  --   --  7.4*  6.9* 6.8* 4.7*   GFR: Estimated Creatinine Clearance: 23 mL/min (A) (by C-G formula based on SCr of 4.41 mg/dL (H)). Recent Labs  Lab 04/27/20 1357 04/27/20 1650 04/28/20 0544  WBC 15.6*  --  16.4*  LATICACIDVEN 2.9* 3.0*  --     Liver Function Tests: Recent Labs  Lab 04/27/20 1357 04/28/20 0544 04/28/20 1600 04/29/20 0451  AST 222* 183*  --   --   ALT 64* 53*  --   --   ALKPHOS 227* 165*  --   --   BILITOT 23.9* 25.0*  --   --   PROT 5.7* 5.7*  --   --   ALBUMIN 1.9* 2.5*  2.6* 2.5* 2.4*   No results for input(s): LIPASE, AMYLASE in the last 168 hours. Recent Labs  Lab 04/27/20 1357  AMMONIA 64*    ABG No results found for: PHART, PCO2ART, PO2ART, HCO3, TCO2, ACIDBASEDEF, O2SAT   Coagulation Profile: Recent Labs  Lab 04/27/20 1357  INR 2.8*    Review of Systems:   Abdominal pain is better  Past Medical History  He,  has a past medical history of Depression, Hypertension, and Suicidal behavior.   Surgical History   History reviewed.  No pertinent surgical history.   Social History   reports that he has been smoking cigarettes. He started smoking about 25 years ago. He has a 20.00 pack-year smoking history. He has never used smokeless tobacco. He reports current alcohol use of about 5.0 standard drinks of alcohol per week. He reports that he does not use drugs.   Family History   His family history includes Arthritis in his mother; Depression in his mother. There is no history of Cancer, Diabetes, Drug abuse, Hearing loss, Early death, Heart disease, Hyperlipidemia, Hypertension, or Kidney disease.   Allergies Allergies  Allergen Reactions  . Penicillins Other (See Comments)    Did it involve swelling of the face/tongue/throat, SOB, or low BP?  No Did it involve sudden or severe rash/hives, skin peeling, or any reaction on the inside of your mouth or nose? Rash Did you need to seek medical attention at a hospital or doctor's office? Yes When did it last happen?Child If all above answers are "NO", may proceed with cephalosporin use.      The patient is critically ill with multiple organ systems failure and requires high complexity decision making for assessment and support, frequent evaluation and titration of therapies, application of advanced monitoring technologies and extensive interpretation of multiple databases. Critical Care Time devoted to patient care services described in this note independent of APP/resident time (if applicable)  is 30 minutes.   Virl Diamond MD [facility] Pulmonary Critical Care Personal pager: [phone] If unanswered, please page CCM On-call: #[phone]

## 2020-04-29 NOTE — Progress Notes (Signed)
[facility] Gastroenterology Progress Note  Alan Becker 44 y.o. 06-29-1976   Subjective: Resting but arousable. Denies abdominal pain.  Objective: Vital signs: Vitals:   04/29/20 1000 04/29/20 1100  BP: 116/87 (!) 117/92  Pulse: 83 78  Resp: 11 12  Temp:    SpO2: 94% 94%  T 97.3  Physical Exam: Gen: lethargic, no acute distress, jaundice HEENT: +icteric sclera CV: RRR Chest: CTA B Abd: less distended, nontender, +BS Ext: no edema  Lab Results: Recent Labs    04/28/20 0544 04/28/20 0544 04/28/20 1600 04/29/20 0451  NA 134*   134*   < > 136 133*  K 3.8   3.6   < > 3.9 3.8  CL 92*   94*   < > 97* 98  CO2 20*   20*   < > 20* 23  GLUCOSE 91   89   < > 151* 117*  BUN 64*   64*   < > 59* 36*  CREATININE 9.18*   8.56*   < > 7.68* 4.41*  CALCIUM 6.2*   6.2*   < > 6.4* 7.0*  MG 1.2*  --   --  2.0  PHOS 7.4*   6.9*   < > 6.8* 4.7*   < > = values in this interval not displayed.   Recent Labs    04/27/20 1357 04/27/20 1357 04/28/20 0544 04/28/20 0544 04/28/20 1600 04/29/20 0451  AST 222*  --  183*  --   --   --   ALT 64*  --  53*  --   --   --   ALKPHOS 227*  --  165*  --   --   --   BILITOT 23.9*  --  25.0*  --   --   --   PROT 5.7*  --  5.7*  --   --   --   ALBUMIN 1.9*   < > 2.5*   2.6*   < > 2.5* 2.4*   < > = values in this interval not displayed.   Recent Labs    04/27/20 1357 04/28/20 0544  WBC 15.6* 16.4*  NEUTROABS 11.7*  --   HGB 11.5* 10.6*  HCT 30.7* 28.2*  MCV 99.7 100.0  PLT 282 275      Assessment/Plan: Alcoholic liver injury (severe alcoholic hepatitis) with renal failure on CRRT. Supportive care. No new GI recs. Available to see again if needed otherwise will sign off. Call if questions.   [name] 04/29/2020, 11:05 AM  Questions please call [phone] ID: Alan Becker, male   DOB: [birth year], 44 y.o.   MRN: [id number]

## 2020-04-30 LAB — RENAL FUNCTION PANEL
Albumin: 2.2 g/dL — ABNORMAL LOW (ref 3.5–5.0)
Anion gap: 11 (ref 5–15)
BUN: 20 mg/dL (ref 6–20)
CO2: 25 mmol/L (ref 22–32)
Calcium: 7.3 mg/dL — ABNORMAL LOW (ref 8.9–10.3)
Chloride: 101 mmol/L (ref 98–111)
Creatinine, Ser: 1.84 mg/dL — ABNORMAL HIGH (ref 0.61–1.24)
GFR calc Af Amer: 51 mL/min — ABNORMAL LOW (ref >60)
GFR calc non Af Amer: 44 mL/min — ABNORMAL LOW (ref >60)
Glucose, Bld: 96 mg/dL (ref 70–99)
Phosphorus: 2.8 mg/dL (ref 2.5–4.6)
Potassium: 4 mmol/L (ref 3.5–5.1)
Sodium: 137 mmol/L (ref 135–145)

## 2020-04-30 LAB — MRSA PCR SCREENING: MRSA by PCR: NEGATIVE

## 2020-04-30 LAB — APTT: aPTT: 52 seconds — ABNORMAL HIGH (ref 24–36)

## 2020-04-30 LAB — GLUCOSE, CAPILLARY: Glucose-Capillary: 81 mg/dL (ref 70–99)

## 2020-04-30 LAB — MAGNESIUM: Magnesium: 2.3 mg/dL (ref 1.7–2.4)

## 2020-04-30 MED ORDER — MIDODRINE HCL 5 MG PO TABS
10.0000 mg | ORAL_TABLET | Freq: Two times a day (BID) | ORAL | Status: DC
  Administered 2020-04-30 – 2020-05-01 (×2): 10 mg via ORAL
  Filled 2020-04-30 (×2): qty 2

## 2020-04-30 MED ORDER — PROCHLORPERAZINE EDISYLATE 10 MG/2ML IJ SOLN
10.0000 mg | Freq: Four times a day (QID) | INTRAMUSCULAR | Status: DC | PRN
  Administered 2020-04-30: 10 mg via INTRAVENOUS
  Filled 2020-04-30 (×2): qty 2

## 2020-04-30 NOTE — Progress Notes (Signed)
eLink Physician-Brief Progress Note Patient Name: Alan Becker DOB: May 12, 1976 MRN: [id number]   Date of Service  04/30/2020  HPI/Events of Note  Patient still nauseated despite zofran. Qt prolonged  eICU Interventions  Ordered prn compazine        Darl Pikes 04/30/2020, 3:19 AM

## 2020-04-30 NOTE — Progress Notes (Signed)
NAME:  Alan Becker, MRN:  [id number], DOB:  [birth year], LOS: 3 ADMISSION DATE:  04/27/2020, CONSULTATION DATE:  7/8 REFERRING MD:  Tegeler, CHIEF COMPLAINT:  confusion   Brief History   44 y/o male who has been drinking heavily for several years presented to the [facility] ER complaining of confusion, weakness, change in skin color.  Noted to have evidence of liver and kidney failure, PCCM consulted for admission.  History of present illness   44 y/o male presented to the [facility] ED after his family noted him to be very confused and have skin color changes.  He has a history of depression and has been drinking heavily for the last 8 years since his divorce.  His mother gives most of the history because the patient is confused.  She says that he has been experiencing abdominal bloating/pain, fatigue and malaise for the last 3-4 weeks.  He passed some blood in his stool yesterday, none today.  No recent vomiting.  He took 800mg  ibuprofen yesterday to help with the abdominal pain.  Because of his confusion and skin changes his mother thought it would be best for him to be admitted to the hospital for further evaluation.  He has not had anything to drink in the last 3-4 weeks.  He vomited some initially after he quit drinking, but not much since then.  Appetite poor.  No fevers or chills.    Past Medical History  Alcoholism with heavy alcohol abuse Essential Hypertension  Significant Hospital Events   7/8 admission 7/9 started CRRT Consults:  PCCM Nephrology  Procedures:  7/8 diagnostic paracentesis> 30 cc of dark yellow fluid aspirated  Significant Diagnostic Tests:    Micro Data:  7/8 peritoneal fluid> no organism so far  Antimicrobials:  ceftriaxone 7/8 -7/10  Interim history/subjective:   Pain and discomfort is better Shortness of breath is better Denies any abdominal pain today  Objective   Blood pressure 116/81, pulse 75, temperature (!) 97.4 F (36.3 C), temperature source Oral,  resp. rate 17, height 5\' 8"  (1.727 m), weight 87.7 kg, SpO2 94 %.        Intake/Output Summary (Last 24 hours) at 04/30/2020 1347 Last data filed at 04/30/2020 1300 Gross per 24 hour  Intake 2405.52 ml  Output 1586 ml  Net 819.52 ml   Filed Weights   04/27/20 1321 04/29/20 0500  Weight: 79.4 kg 87.7 kg    Examination: Gen: jaundiced, appears ill HENT: Pale Eyes: PERRL, EOMi  Lymph: No adenopathy PULM: Clear breath sounds bilaterally CV: RRR, no mgr, no JVD GI: Bowel sounds appreciated, full, nontender Derm: Jaundice MSK: normal bulk and tone Neuro: Awake and alert Psyche: normal mood and affect  Resolved Hospital Problem list     Assessment & Plan:  Acute decompensated alcoholic cirrhosis causing acute kidney injury, 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  Differential diagnosis includes ATN considering recent ibuprofen use but I think that is less likely. Hold narcotics No Tylenol CRRT was stopped today Continue octreotide infusion note Nucleated cells in fluid 166 -Patient will be transferred to [facility] for hemodialysis  Ascites/abdominal pain: Concern for spontaneous bacterial peritonitis Diagnostic paracentesis performed in the emergency room,-no organism, nucleated cells less than 250 discontinued ceftriaxone Abdominal discomfort is a lot better  Alcoholic hepatitis, discriminant function 89 Continue prednisone Monitor LFT  Depression: Holding home antidepressants at this time considering acute encephalopathy  Acute metabolic encephalopathy due to hepatic encephalopathy: Lactulose daily, titrate to  3 stools a day   Hypertension: Hold home antihypertensives at this time  Hematochezia 7/7 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 Monitor for bleeding Transfuse PRBC for Hgb < 7 gm/dL  [facility]  Best practice:  Diet: renal diet Pain/Anxiety/Delirium protocol (if indicated):  Dilaudid as needed VAP protocol (if indicated): Not indicated DVT prophylaxis: SCD GI prophylaxis:  Glucose control:monitor glucose with labs Mobility: bed rest Code Status: full Family Communication: Discussed with patient, will update patients mother Disposition: remain in ICU  Labs   CBC: Recent Labs  Lab 04/27/20 1357 04/28/20 0544  WBC 15.6* 16.4*  NEUTROABS 11.7*  --   HGB 11.5* 10.6*  HCT 30.7* 28.2*  MCV 99.7 100.0  PLT 282 275    Basic Metabolic Panel: Recent Labs  Lab 04/27/20 1357 04/27/20 2350 04/28/20 0544 04/28/20 1600 04/29/20 0451 04/29/20 1600 04/30/20 0500  NA   < >  --  134*  134* 136 133* 135 137  K   < >  --  3.8  3.6 3.9 3.8 3.6 4.0  CL   < >  --  92*  94* 97* 98 102 101  CO2   < >  --  20*  20* 20* 23 25 25   GLUCOSE   < >  --  91  89 151* 117* 101* 96  BUN   < >  --  64*  64* 59* 36* 25* 20  CREATININE   < >  --  9.18*  8.56* 7.68* 4.41* 2.85* 1.84*  CALCIUM   < >  --  6.2*  6.2* 6.4* 7.0* 6.6* 7.3*  MG  --  1.3* 1.2*  --  2.0  --  2.3  PHOS  --   --  7.4*  6.9* 6.8* 4.7* 3.5 2.8   < > = values in this interval not displayed.   GFR: Estimated Creatinine Clearance: 55.1 mL/min (A) (by C-G formula based on SCr of 1.84 mg/dL (H)). Recent Labs  Lab 04/27/20 1357 04/27/20 1650 04/28/20 0544  WBC 15.6*  --  16.4*  LATICACIDVEN 2.9* 3.0*  --     Liver Function Tests: Recent Labs  Lab 04/27/20 1357 04/27/20 1357 04/28/20 0544 04/28/20 1600 04/29/20 0451 04/29/20 1600 04/30/20 0500  AST 222*  --  183*  --   --   --   --   ALT 64*  --  53*  --   --   --   --   ALKPHOS 227*  --  165*  --   --   --   --   BILITOT 23.9*  --  25.0*  --   --   --   --   PROT 5.7*  --  5.7*  --   --   --   --   ALBUMIN 1.9*   < > 2.5*  2.6* 2.5* 2.4* 2.2* 2.2*   < > = values in this interval not displayed.   No results for input(s): LIPASE, AMYLASE in the last 168 hours. Recent Labs  Lab 04/27/20 1357  AMMONIA 64*    ABG No results  found for: PHART, PCO2ART, PO2ART, HCO3, TCO2, ACIDBASEDEF, O2SAT   Coagulation Profile: Recent Labs  Lab 04/27/20 1357  INR 2.8*    Review of Systems:   Abdominal pain is better  Past Medical History  He,  has a past medical history of Depression, Hypertension, and Suicidal behavior.   Surgical History   History reviewed. No pertinent  surgical history.   Social History   reports that he has been smoking cigarettes. He started smoking about 25 years ago. He has a 20.00 pack-year smoking history. He has never used smokeless tobacco. He reports current alcohol use of about 5.0 standard drinks of alcohol per week. He reports that he does not use drugs.   Family History   His family history includes Arthritis in his mother; Depression in his mother. There is no history of Cancer, Diabetes, Drug abuse, Hearing loss, Early death, Heart disease, Hyperlipidemia, Hypertension, or Kidney disease.   Allergies Allergies  Allergen Reactions  . Penicillins Other (See Comments)    Did it involve swelling of the face/tongue/throat, SOB, or low BP? No Did it involve sudden or severe rash/hives, skin peeling, or any reaction on the inside of your mouth or nose? Rash Did you need to seek medical attention at a hospital or doctor's office? Yes When did it last happen?Child If all above answers are "NO", may proceed with cephalosporin use.      The patient is critically ill with multiple organ systems failure and requires high complexity decision making for assessment and support, frequent evaluation and titration of therapies, application of advanced monitoring technologies and extensive interpretation of multiple databases. Critical Care Time devoted to patient care services described in this note independent of APP/resident time (if applicable)  is 32 minutes.   Virl Diamond MD [place] Pulmonary Critical Care Personal pager: [phone] If unanswered, please page CCM On-call:  #[phone]

## 2020-04-30 NOTE — Progress Notes (Signed)
[facility] Progress Note  Subjective: seen in room, on CRRT, no c/o's today, no new issues  Vitals:   04/30/20 0900 04/30/20 1000 04/30/20 1100 04/30/20 1200  BP: 110/65 106/63 (!) 118/58 116/70  Pulse: 79 74 72 73  Resp: 19 14 14 16   Temp:      TempSrc:      SpO2: 98% 97% 96% 94%  Weight:      Height:        Exam:   alert, nad , diffuse jaundice  mouth is not dry  no jvd  Chest cta bilat  Cor reg no RG  Abd soft ntnd +2-3 ascites   Ext no leg or UE edema   Alert, NF, ox3    UA 7/9 - hazy, amber, 50 glu, mod Hb, +nit, 5.0, 100 prot, 0-5 rbc/ 11-20 wbc   UNa 7/9 - 20   UCr  7/9 - 231   FeNa low at 0.3%   Assessment/Plan: 1.  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 Other possibility is bile- induced tubular injury/ ATN. Also decreased oral intake, NSAIDs and recent ARB use may be contributing.  He has a moderate amount of ascites and if therapeutic paracentesis is desired would remove max 3 to 4 L with concomitant administration of intravenous albumin 25%-75 g for symptomatic relief  - weaned off levo yesterday  - still on IV octreotide > will dc as he has rec'd 3 days Rx  - add midodrine 10 bid and ^ as needed for MAP > 80  - sp IV albumin 1gm/kg given x 1 on 7/8 and 7/9  - urine lytes c/w HRS, FeNa < 1%  - UOP 255 cc yest, not enough to suspect recovery  - stable off pressors, will dc CRRT and ask for transfer to [facility] for iHD  2.  Acute decompensated alcoholic hepatitis: Avoid Tylenol/additional hepatotoxins and agree with efforts at improving hemodynamic status with intravenous Levophed/octreotide and albumin.   He has been seen by Dr. 9/9 of gastroenterology and his note reviewed; currently not a candidate for liver transplantation. 3.  Acute metabolic encephalopathy: Likely secondary to hepatic encephalopathy based on elevated ammonia level with possibly some impact from azotemia.  On lactulose. Resolved.  4.  Hypotension:  Discontinued ARB/oral BP meds , starting midodrine     Rob Taji Barretto 04/30/2020, 1:01 PM   Recent Labs  Lab 04/27/20 1357 04/27/20 1357 04/28/20 0544 04/28/20 1600 04/29/20 1600 04/30/20 0500  K 3.5   < > 3.8   3.6   < > 3.6 4.0  BUN 64*   < > 64*   64*   < > 25* 20  CREATININE 8.55*   < > 9.18*   8.56*   < > 2.85* 1.84*  CALCIUM 6.5*   < > 6.2*   6.2*   < > 6.6* 7.3*  PHOS  --   --  7.4*   6.9*   < > 3.5 2.8  HGB 11.5*  --  10.6*  --   --   --    < > = values in this interval not displayed.   Inpatient medications:  sodium chloride   Intravenous Once   Chlorhexidine Gluconate Cloth  6 each Topical Q0600   lactulose  30 g Oral Daily   mouth rinse  15 mL Mouth Rinse BID   prednisoLONE  30 mg Oral Q breakfast   thiamine  100 mg Oral Daily  prismasol BGK 4/2.5 400 mL/hr at 04/30/20 0554    prismasol BGK 4/2.5 200 mL/hr at 04/29/20 1648   sodium chloride 15 mL/hr at 04/30/20 1200   norepinephrine (LEVOPHED) Adult infusion Stopped (04/29/20 2003)   octreotide  (SANDOSTATIN)    IV infusion 50 mcg/hr (04/30/20 1200)   prismasol BGK 4/2.5 1,800 mL/hr at 04/30/20 1135   alteplase, alum & mag hydroxide-simeth, docusate sodium, heparin, HYDROmorphone (DILAUDID) injection, ondansetron (ZOFRAN) IV, polyethylene glycol, prochlorperazine, sodium chloride

## 2020-05-01 LAB — BODY FLUID CULTURE: Culture: NO GROWTH

## 2020-05-01 LAB — PATHOLOGIST SMEAR REVIEW

## 2020-05-01 MED ORDER — ALBUMIN HUMAN 25 % IV SOLN: 50.0000 g | Freq: Two times a day (BID) | INTRAVENOUS | Status: DC

## 2020-05-01 MED ORDER — MIDODRINE HCL 5 MG PO TABS
15.0000 mg | ORAL_TABLET | Freq: Three times a day (TID) | ORAL | Status: DC
  Administered 2020-05-01 – 2020-05-12 (×27): 15 mg via ORAL
  Filled 2020-05-01 (×29): qty 3

## 2020-05-01 MED ORDER — ALBUMIN HUMAN 25 % IV SOLN
50.0000 g | Freq: Two times a day (BID) | INTRAVENOUS | Status: DC
  Administered 2020-05-01 – 2020-05-03 (×4): 50 g via INTRAVENOUS
  Filled 2020-05-01 (×5): qty 200

## 2020-05-01 MED ORDER — SODIUM CHLORIDE 0.9% FLUSH: 10.0000 mL | INTRAVENOUS | Status: DC | PRN

## 2020-05-01 MED ORDER — OCTREOTIDE ACETATE 100 MCG/ML IJ SOLN
150.0000 ug | Freq: Two times a day (BID) | INTRAMUSCULAR | Status: AC
  Administered 2020-05-01 – 2020-05-04 (×6): 150 ug via SUBCUTANEOUS
  Filled 2020-05-01 (×8): qty 1.5

## 2020-05-01 MED ORDER — OXYCODONE HCL 5 MG PO TABS
5.0000 mg | ORAL_TABLET | ORAL | Status: DC | PRN
  Administered 2020-05-01 – 2020-05-02 (×4): 5 mg via ORAL
  Filled 2020-05-01 (×4): qty 1

## 2020-05-01 MED ORDER — SODIUM CHLORIDE 0.9% FLUSH
10.0000 mL | Freq: Two times a day (BID) | INTRAVENOUS | Status: DC
  Administered 2020-05-01 – 2020-05-12 (×19): 10 mL

## 2020-05-01 MED ORDER — MIDODRINE HCL 5 MG PO TABS
10.0000 mg | ORAL_TABLET | Freq: Three times a day (TID) | ORAL | Status: DC
  Administered 2020-05-01: 10 mg via ORAL
  Filled 2020-05-01: qty 2

## 2020-05-01 NOTE — Plan of Care (Signed)
  Problem: Education: Goal: Knowledge of General Education information will improve Description: Including pain rating scale, medication(s)/side effects and non-pharmacologic comfort measures Outcome: Progressing   Problem: Health Behavior/Discharge Planning: Goal: Ability to manage health-related needs will improve Outcome: Progressing   Problem: Clinical Measurements: Goal: Will remain free from infection Outcome: Progressing   

## 2020-05-01 NOTE — Plan of Care (Signed)
  Problem: Education: Goal: Knowledge of General Education information will improve Description: Including pain rating scale, medication(s)/side effects and non-pharmacologic comfort measures Outcome: Completed/Met   Problem: Clinical Measurements: Goal: Will remain free from infection Outcome: Completed/Met Goal: Diagnostic test results will improve Outcome: Completed/Met   Problem: Activity: Goal: Risk for activity intolerance will decrease Outcome: Completed/Met   Problem: Elimination: Goal: Will not experience complications related to bowel motility Outcome: Completed/Met Goal: Will not experience complications related to urinary retention Outcome: Completed/Met   Problem: Skin Integrity: Goal: Risk for impaired skin integrity will decrease Outcome: Completed/Met

## 2020-05-01 NOTE — Progress Notes (Addendum)
NAME:  Alan Becker, MRN:  [id number], DOB:  [birth year], LOS: 4 ADMISSION DATE:  04/27/2020, CONSULTATION DATE:  7/8 REFERRING MD:  Tegeler, CHIEF COMPLAINT:  confusion   Brief History   44 y/o male who has been drinking heavily for several years presented to the [facility] ER complaining of confusion, weakness, change in skin color.  Noted to have evidence of liver and kidney failure, PCCM consulted for admission.  History of present illness   44 y/o male presented to the [facility] ED after his family noted him to be very confused and have skin color changes.  He has a history of depression and has been drinking heavily for the last 8 years since his divorce.  His mother gives most of the history because the patient is confused.  She says that he has been experiencing abdominal bloating/pain, fatigue and malaise for the last 3-4 weeks.  He passed some blood in his stool yesterday, none today.  No recent vomiting.  He took 800mg  ibuprofen yesterday to help with the abdominal pain.  Because of his confusion and skin changes his mother thought it would be best for him to be admitted to the hospital for further evaluation.  He has not had anything to drink in the last 3-4 weeks.  He vomited some initially after he quit drinking, but not much since then.  Appetite poor.  No fevers or chills.    Past Medical History  Alcoholism with heavy alcohol abuse Essential Hypertension  Significant Hospital Events   7/8 admission 7/9 started CRRT  Consults:  PCCM Nephrology  Procedures:  7/8 diagnostic paracentesis> 30 cc of dark yellow fluid aspirated 7/8 CT Head nl  7/8 Ct abd pelvis: Hepatomegaly, parenchymal changes, and moderate ascites.  Focal hypoattenuating mass within segment 4 a of the liver is not well characterized on this noncontrast examination and could be better assessed with dedicated liver protocol CT or MRI examination once the patient's acute issues have resolved. Pathologic periportal adenopathy  is nonspecific and may be simply reactive given the pathologic process within the liver.  Significant Diagnostic Tests:    Micro Data:  7/8 peritoneal fluid> no organism so far  Antimicrobials:  ceftriaxone 7/8 -7/10  Interim history/subjective:    Objective   Blood pressure 97/60, pulse 96, temperature 97.6 F (36.4 C), temperature source Oral, resp. rate (!) 24, height 5\' 8"  (1.727 m), weight 77.6 kg, SpO2 93 %.        Intake/Output Summary (Last 24 hours) at 05/01/2020 0905 Last data filed at 05/01/2020 0600 Gross per 24 hour  Intake 1399.01 ml  Output 408 ml  Net 991.01 ml   Filed Weights   04/29/20 0500 04/30/20 2034 05/01/20 0337  Weight: 87.7 kg 87.4 kg 77.6 kg    Examination:  General: NAD, appears older than state age HE: Normocephalic, atraumatic , EOMI, jaundice sclera  ENT: No congestion, no rhinorrhea, no exudate or erythema  Cardiovascular: Normal rate, regular rhythm.  No murmurs, rubs, or gallops Pulmonary : Effort normal, breath sounds normal. No wheezes, rales, or rhonchi Abdominal: soft, nontender, ascites , bowel sounds present Musculoskeletal: no swelling , deformity, injury ,or tenderness in extremities, Skin: Warm, dry , jaundiced  Psychiatric/Behavioral:  normal mood, normal behavior  Neuro: AOx3  Labs reviewed from 7/11- Cr improving  , Mg 2.3 , K 4 . Urine output ~ 173 cc  Resolved Hospital Problem list   Ascites/abdominal pain: Concern for spontaneous bacterial peritonitis Diagnostic paracentesis on 7/08 performed in the  emergency room,-no organism, nucleated cells less than 250, ceftriaxone discontinue  Acute metabolic encephalopathy due to hepatic encephalopathy: mental status has improved , alert and oriented on my exam 7/12 Assessment & Plan:  Acute decompensated alcoholic cirrhosis causing acute kidney injury, 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   Transferred her to [facility] for HD.  Kidney function improving on labs and with CRRT at [facility].  S/p 3 days of octreotide infusion Plan: Nephrology following, no plan for HD today  Hold narcotics No Tylenol Stable for floor, consult Hospitalist  Consider follow up of mass in Segment 4a of liver with liver Protocol CT or MRI  Alcoholic hepatitis - discriminant function 89 on admit. - Alert on exam today,  LFT slightly improved, Billi trending up. 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  Monitor for bleeding Transfuse PRBC for Hgb < 7 gm/dL Plan: CBC in the morning  Best practice:  Diet: renal diet Pain/Anxiety/Delirium protocol (if indicated): Dilaudid as needed VAP protocol (if indicated): Not indicated DVT prophylaxis: SCD GI prophylaxis:  Glucose control:monitor glucose with labs Mobility: bed rest Code Status: full Family Communication: Mother updated bedside Disposition: remain in ICU  [name], MD PGY2   ________________________  PCCM Attending:   44 yo prior alcohol abuse, advanced cirrhosis, developed hepatorenal syndrome, was on cvvhd now on HD.   BP 97/60   Pulse 96   Temp 97.6 F (36.4 C) (Oral)   Resp (!) 24   Ht 5\' 8"  (1.727 m)   Wt 77.6 kg   SpO2 93%   BMI 26.01 kg/m   Gen: chroncially ill appearing  Skin: Jaundiced  HENT: sclera icterus  Heart: RRR, s1 s2 Lungs: CTAB  Abd: distended   Labs: reviewed, no urgent need for HD   A:  Acute on chronic decompensated cirrhosis  AKI related to hepatorenal syndrome  Alcoholic hepatitis, on prednisone  P:  Discussed with nephrology  Awaiting start of HD  Likely tomorrow  Continue lactulose Titrate to appropriate BM  Continue prednisone   Stable for transfer from the ICU.  I spoke with Dr. who has agreed to take patient.   This patient is critically ill with multiple organ system failure; which, requires frequent high complexity decision making, assessment, support, evaluation, and titration of therapies. This was completed through the application of advanced monitoring technologies and extensive interpretation of multiple databases. During this encounter critical care time was devoted to patient care services described in this note for 33 minutes.  Margo Aye, DO [facility] 05/01/2020 10:52 AM

## 2020-05-01 NOTE — Progress Notes (Signed)
[facility] Progress Note  Subjective: seen in room, no c/o , no SOB or abd pain. BP's softer today in 90's.  UOP < 200 cc  Vitals:   05/01/20 0500 05/01/20 0600 05/01/20 0625 05/01/20 0730  BP: 106/74 (!) 91/59 97/60   Pulse: 87 91 84 96  Resp: (!) 24 (!) 31 20 (!) 24  Temp:    97.6 F (36.4 C)  TempSrc:    Oral  SpO2: 92% 93% 92% 93%  Weight:      Height:        Exam:   alert, nad , diffuse jaundice  mouth moist  no jvd  Chest cta bilat  Cor reg no RG  Abd soft ntnd +2-3 ascites   Ext no sig leg or UE edema   Alert, NF, ox3    UA 7/9 - hazy, amber, 50 glu, mod Hb, +nit, 5.0, 100 prot, 0-5 rbc/ 11-20 wbc   UNa 7/9 - 20   UCr  7/9 - 231   FeNa low at 0.3%   Assessment/Plan: 1.  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 Other possibility is bile- induced tubular injury/ ATN. Urine lytes c/w HRS, FeNa < 1%. Also decreased oral intake, NSAIDs and recent ARB use could have contributed. +moderate amount of ascites and if therapeutic paracentesis is desired would remove max 3 to 4 L max with concomitant administration of intravenous albumin 25%-75 g  - weaned off levo 7/10  - Sp CRRT 7/9- 7/11  - no sign of renal recovery yet, will repeat HRS protocol w/ octreotide/ midodrine/ IV alb  - goal MAP w/ midodrine > 75- 80  - no HD needed today, will reassess on daily basis   - UOP remains poor, have d/w pt and pt's mother and answered questions  - hopefully kidney function can recover  2.  Acute decompensated alcoholic hepatitis: Avoid Tylenol/additional hepatotoxins and agree with efforts at improving hemodynamic status with intravenous Levophed/octreotide and albumin.   He has been seen by Dr. Bosie Clos of gastroenterology and his note reviewed; currently not a candidate for liver transplantation. 3.  Acute metabolic encephalopathy: Likely secondary to hepatic encephalopathy based on elevated ammonia level with possibly some impact from azotemia.   On lactulose. Resolved.  4.  Hypotension: Discontinued ARB/oral BP meds , getting midodrine, sp pressors     Rob Samreen Seltzer 05/01/2020, 11:56 AM   Recent Labs  Lab 04/27/20 1357 04/27/20 1357 04/28/20 0544 04/28/20 1600 04/29/20 1600 04/30/20 0500  K 3.5   < > 3.8  3.6   < > 3.6 4.0  BUN 64*   < > 64*  64*   < > 25* 20  CREATININE 8.55*   < > 9.18*  8.56*   < > 2.85* 1.84*  CALCIUM 6.5*   < > 6.2*  6.2*   < > 6.6* 7.3*  PHOS  --   --  7.4*  6.9*   < > 3.5 2.8  HGB 11.5*  --  10.6*  --   --   --    < > = values in this interval not displayed.   Inpatient medications: . sodium chloride   Intravenous Once  . Chlorhexidine Gluconate Cloth  6 each Topical Q0600  . lactulose  30 g Oral Daily  . mouth rinse  15 mL Mouth Rinse BID  . midodrine  15 mg Oral TID WC  . octreotide  150 mcg Subcutaneous Q12H  . prednisoLONE  30  mg Oral Q breakfast  . sodium chloride flush  10-40 mL Intracatheter Q12H  . thiamine  100 mg Oral Daily   . sodium chloride Stopped (04/30/20 1614)  . norepinephrine (LEVOPHED) Adult infusion Stopped (04/29/20 2003)   alum & mag hydroxide-simeth, docusate sodium, ondansetron (ZOFRAN) IV, oxyCODONE, polyethylene glycol, prochlorperazine, sodium chloride flush

## 2020-05-02 DIAGNOSIS — K922 Gastrointestinal hemorrhage, unspecified: Secondary | ICD-10-CM

## 2020-05-02 DIAGNOSIS — K7031 Alcoholic cirrhosis of liver with ascites: Secondary | ICD-10-CM

## 2020-05-02 LAB — CBC
HCT: 28.6 % — ABNORMAL LOW (ref 39.0–52.0)
Hemoglobin: 10.3 g/dL — ABNORMAL LOW (ref 13.0–17.0)
MCH: 36.8 pg — ABNORMAL HIGH (ref 26.0–34.0)
MCHC: 36 g/dL (ref 30.0–36.0)
MCV: 102.1 fL — ABNORMAL HIGH (ref 80.0–100.0)
Platelets: 132 10*3/uL — ABNORMAL LOW (ref 150–400)
RBC: 2.8 MIL/uL — ABNORMAL LOW (ref 4.22–5.81)
RDW: 21.7 % — ABNORMAL HIGH (ref 11.5–15.5)
WBC: 28.3 10*3/uL — ABNORMAL HIGH (ref 4.0–10.5)
nRBC: 0.1 % (ref 0.0–0.2)

## 2020-05-02 LAB — COMPREHENSIVE METABOLIC PANEL
ALT: 72 U/L — ABNORMAL HIGH (ref 0–44)
AST: 213 U/L — ABNORMAL HIGH (ref 15–41)
Albumin: 2.1 g/dL — ABNORMAL LOW (ref 3.5–5.0)
Alkaline Phosphatase: 178 U/L — ABNORMAL HIGH (ref 38–126)
Anion gap: 11 (ref 5–15)
BUN: 43 mg/dL — ABNORMAL HIGH (ref 6–20)
CO2: 22 mmol/L (ref 22–32)
Calcium: 6.3 mg/dL — CL (ref 8.9–10.3)
Chloride: 98 mmol/L (ref 98–111)
Creatinine, Ser: 4.06 mg/dL — ABNORMAL HIGH (ref 0.61–1.24)
GFR calc Af Amer: 19 mL/min — ABNORMAL LOW (ref >60)
GFR calc non Af Amer: 17 mL/min — ABNORMAL LOW (ref >60)
Glucose, Bld: 100 mg/dL — ABNORMAL HIGH (ref 70–99)
Potassium: 4.6 mmol/L (ref 3.5–5.1)
Sodium: 131 mmol/L — ABNORMAL LOW (ref 135–145)
Total Bilirubin: 27.5 mg/dL (ref 0.3–1.2)
Total Protein: 5.3 g/dL — ABNORMAL LOW (ref 6.5–8.1)

## 2020-05-02 LAB — CULTURE, BLOOD (ROUTINE X 2)
Culture: NO GROWTH
Culture: NO GROWTH

## 2020-05-02 LAB — PHOSPHORUS: Phosphorus: 3.3 mg/dL (ref 2.5–4.6)

## 2020-05-02 MED ORDER — PANTOPRAZOLE SODIUM 40 MG PO TBEC
40.0000 mg | DELAYED_RELEASE_TABLET | Freq: Every day | ORAL | Status: DC
  Administered 2020-05-02 – 2020-05-03 (×2): 40 mg via ORAL
  Filled 2020-05-02 (×2): qty 1

## 2020-05-02 MED ORDER — SODIUM CHLORIDE 0.9 % IV SOLN
12.5000 mg | Freq: Once | INTRAVENOUS | Status: AC
  Administered 2020-05-02: 12.5 mg via INTRAVENOUS
  Filled 2020-05-02: qty 0.5

## 2020-05-02 MED ORDER — SODIUM CHLORIDE 0.9 % IV SOLN
12.5000 mg | Freq: Once | INTRAVENOUS | Status: DC
  Filled 2020-05-02: qty 0.5

## 2020-05-02 MED ORDER — HEPARIN SODIUM (PORCINE) 5000 UNIT/ML IJ SOLN
5000.0000 [IU] | Freq: Three times a day (TID) | INTRAMUSCULAR | Status: DC
  Administered 2020-05-02 (×2): 5000 [IU] via SUBCUTANEOUS
  Filled 2020-05-02 (×3): qty 1

## 2020-05-02 MED ORDER — CALCIUM GLUCONATE-NACL 1-0.675 GM/50ML-% IV SOLN
1.0000 g | Freq: Once | INTRAVENOUS | Status: AC
  Administered 2020-05-02: 1000 mg via INTRAVENOUS
  Filled 2020-05-02: qty 50

## 2020-05-02 NOTE — Progress Notes (Signed)
Mother called me in to tell me she saw some blood the size of a quarter when he went to the bathroom.  Asked pt if I could assess his rectum to see if maybe he had hemorrhoids and he consented to me examining him.  Rectum noted to be clean and no blood noted, just could tell that he had a bowel movement.  Cleaned with wet wash cloth and reported the situation to Dr Jomarie Longs via text.

## 2020-05-02 NOTE — Plan of Care (Signed)
  Problem: Health Behavior/Discharge Planning: Goal: Ability to manage health-related needs will improve Outcome: Progressing   

## 2020-05-02 NOTE — Progress Notes (Addendum)
PROGRESS NOTE    Alan Becker  PPI:[id number] DOB: May 08, 1976 DOA: 04/27/2020 PCP: [name], MD  Brief Narrative:44 y/o male presented to the [facility] ED after his family noted him to be very confused and have skin color changes.  He has a history of depression and has been drinking heavily for the last 8 years since his divorce.  His mother gives most of the history because the patient is confused.  She says that he has been experiencing abdominal bloating/pain, fatigue and malaise for the last 3-4 weeks. Work-up in the ED noted creatinine of 8.5 with albumin of 1.9, total bili of 23.9 with elevated AST, white count of 15 Admitted to the ICU with decompensated cirrhosis and AKI from hepatorenal syndrome, on 7/9 he started CRRT, GI, renal following, underwent paracentesis on 7/8 with 30 cc of dark yellow fluid aspirated which was negative for SBP -Transferred from PCCM to [facility] service today 7/13  Assessment & Plan:   Acute kidney injury Hepatorenal syndrome Nephrology consulting -Started on midodrine/octreotide/albumin -Underwent CRRT from 7/9 through 7/11 -No evidence of renal recovery yet -No acute indications for dialysis today -Monitor urine output and kidney function daily -Per renal  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 Bosie Clos earlier this admission -His discriminate function was 89 on admission, started on prednisone -Also with mildly elevated ammonia level, continue lactulose -Not a candidate for liver transplant given recent EtOH use -GI recommended supportive care, signed off -Symptomatic ascites, will reattempt paracentesis, discussed with renal, albumin prior to paracentesis, previous paracentesis on 7/8 was diagnostic, 30 mL fluid drained was negative for SBP  Leukocytosis -I suspect this is secondary to prednisolone, afebrile -Also paracentesis to rule out SBP  Long-term alcohol  abuse -Counseled, was unable to drink any alcohol for the last 3 weeks -Continue thiamine  Abnormal liver lesion -CT abdomen on 7/8 noted questionable focal hypoattenuating mass in the liver not well characterized, recommended dedicated liver protocol CT or MRI when more stable  Major depression -Not appropriate for psychotropic meds in the setting of decompensated cirrhosis and renal failure  DVT prophylaxis: SCDs, add heparin subcutaneous Code Status: Full code Family Communication: Discussed with mother at bedside Disposition Plan:  Status is: Inpatient  Remains inpatient appropriate because:Inpatient level of care appropriate due to severity of illness Dispo: The patient is from: Home              Anticipated d/c is to: SNF              Anticipated d/c date is: > 3 days              Patient currently is not medically stable to d/c.  Consultants:   [facility]  Renal  PCCM transfer   Procedures:   Antimicrobials:    Subjective: -Complains of abdominal bloating and distention making him short of breath  Objective: Vitals:   05/01/20 2203 05/02/20 0151 05/02/20 0556 05/02/20 0934  BP: 94/62 106/65 (!) 97/58 102/65  Pulse: 77 83 80 79  Resp: 18 18 18 18   Temp: 98.7 F (37.1 C) 98.7 F (37.1 C) 98.1 F (36.7 C) 97.8 F (36.6 C)  TempSrc: Oral   Oral  SpO2: (!) 89% 91% 92% 92%  Weight:      Height:        Intake/Output Summary (Last 24 hours) at 05/02/2020 1300 Last data filed at 05/02/2020 0900 Gross per 24 hour  Intake 740 ml  Output 600 ml  Net 140 ml   Filed Weights   04/29/20 0500 04/30/20 2034 05/01/20 0337  Weight: 87.7 kg 87.4 kg 77.6 kg    Examination:  General exam: Chronically ill male laying in bed, uncomfortable appearing, awake alert oriented to self, place and partly to time HEENT: Profound scleral icterus Respiratory system: Decreased breath sounds the bases. Cardiovascular system: S1 & S2 heard, RRR.  Gastrointestinal  system: Soft, distended, nontender, bowel sounds present Central nervous system: Alert and oriented.  Mild asterixis  extremities: No edema Skin: Icteric Psychiatry: Flat affect    Data Reviewed:   CBC: Recent Labs  Lab 04/27/20 1357 04/28/20 0544 05/02/20 0426  WBC 15.6* 16.4* 28.3*  NEUTROABS 11.7*  --   --   HGB 11.5* 10.6* 10.3*  HCT 30.7* 28.2* 28.6*  MCV 99.7 100.0 102.1*  PLT 282 275 132*   Basic Metabolic Panel: Recent Labs  Lab 04/27/20 1357 04/27/20 2350 04/28/20 0544 04/28/20 0544 04/28/20 1600 04/29/20 0451 04/29/20 1600 04/30/20 0500 05/02/20 0426  NA   < >  --  134*  134*   < > 136 133* 135 137 131*  K   < >  --  3.8  3.6   < > 3.9 3.8 3.6 4.0 4.6  CL   < >  --  92*  94*   < > 97* 98 102 101 98  CO2   < >  --  20*  20*   < > 20* GLUCOSE   < >  --  91  89   < > 151* 117* 101* 96 100*  BUN   < >  --  64*  64*   < > 59* 36* 25* 20 43*  CREATININE   < >  --  9.18*  8.56*   < > 7.68* 4.41* 2.85* 1.84* 4.06*  CALCIUM   < >  --  6.2*  6.2*   < > 6.4* 7.0* 6.6* 7.3* 6.3*  MG  --  1.3* 1.2*  --   --  2.0  --  2.3  --   PHOS  --   --  7.4*  6.9*   < > 6.8* 4.7* 3.5 2.8 3.3   < > = values in this interval not displayed.   GFR: Estimated Creatinine Clearance: 22.5 mL/min (A) (by C-G formula based on SCr of 4.06 mg/dL (H)). Liver Function Tests: Recent Labs  Lab 04/27/20 1357 04/27/20 1357 04/28/20 0544 04/28/20 0544 04/28/20 1600 04/29/20 0451 04/29/20 1600 04/30/20 0500 05/02/20 0426  AST 222*  --  183*  --   --   --   --   --  213*  ALT 64*  --  53*  --   --   --   --   --  72*  ALKPHOS 227*  --  165*  --   --   --   --   --  178*  BILITOT 23.9*  --  25.0*  --   --   --   --   --  27.5*  PROT 5.7*  --  5.7*  --   --   --   --   --  5.3*  ALBUMIN 1.9*   < > 2.5*  2.6*   < > 2.5* 2.4* 2.2* 2.2* 2.1*   < > = values in this interval not displayed.   No results for input(s): LIPASE, AMYLASE in the last 168 hours. Recent  Labs   Lab 04/27/20 1357  AMMONIA 64*   Coagulation Profile: Recent Labs  Lab 04/27/20 1357  INR 2.8*   Cardiac Enzymes: No results for input(s): CKTOTAL, CKMB, CKMBINDEX, TROPONINI in the last 168 hours. BNP (last 3 results) No results for input(s): PROBNP in the last 8760 hours. HbA1C: No results for input(s): HGBA1C in the last 72 hours. CBG: Recent Labs  Lab 04/30/20 2031  GLUCAP 81   Lipid Profile: No results for input(s): CHOL, HDL, LDLCALC, TRIG, CHOLHDL, LDLDIRECT in the last 72 hours. Thyroid Function Tests: No results for input(s): TSH, T4TOTAL, FREET4, T3FREE, THYROIDAB in the last 72 hours. Anemia Panel: No results for input(s): VITAMINB12, FOLATE, FERRITIN, TIBC, IRON, RETICCTPCT in the last 72 hours. Urine analysis:    Component Value Date/Time   COLORURINE AMBER (A) 04/28/2020 0542   APPEARANCEUR HAZY (A) 04/28/2020 0542   LABSPEC 1.019 04/28/2020 0542   PHURINE 5.0 04/28/2020 0542   GLUCOSEU 50 (A) 04/28/2020 0542   GLUCOSEU NEGATIVE 08/17/2019 1701   HGBUR MODERATE (A) 04/28/2020 0542   BILIRUBINUR MODERATE (A) 04/28/2020 0542   KETONESUR NEGATIVE 04/28/2020 0542   PROTEINUR 100 (A) 04/28/2020 0542   UROBILINOGEN 1.0 08/17/2019 1701   NITRITE POSITIVE (A) 04/28/2020 0542   LEUKOCYTESUR NEGATIVE 04/28/2020 0542   Sepsis Labs: @LABRCNTIP (procalcitonin:4,lacticidven:4)  ) Recent Results (from the past 240 hour(s))  Culture, blood (routine x 2)     Status: None   Collection Time: 04/27/20  1:57 PM   Specimen: BLOOD  Result Value Ref Range Status   Specimen Description   Final    BLOOD RIGHT ANTECUBITAL Performed at [facility], [address] [place], [place]    Special Requests   Final    BOTTLES DRAWN AEROBIC AND ANAEROBIC Blood Culture results may not be optimal due to an inadequate volume of blood received in culture bottles Performed at [facility], [address], [place], [place]     Culture   Final    NO GROWTH 5 DAYS Performed at [facility], [address], [place], [place]    Report Status 05/02/2020 FINAL  Final  SARS Coronavirus 2 by RT PCR (hospital order, performed in [facility] hospital lab) Nasopharyngeal Nasopharyngeal Swab     Status: None   Collection Time: 04/27/20  1:57 PM   Specimen: Nasopharyngeal Swab  Result Value Ref Range Status   SARS Coronavirus 2 NEGATIVE NEGATIVE Final    Comment: (NOTE) SARS-CoV-2 target nucleic acids are NOT DETECTED.  The SARS-CoV-2 RNA is generally detectable in upper and lower respiratory specimens during the acute phase of infection. The lowest concentration of SARS-CoV-2 viral copies this assay can detect is 250 copies / mL. A negative result does not preclude SARS-CoV-2 infection and should not be used as the sole basis for treatment or other patient management decisions.  A negative result may occur with improper specimen collection / handling, submission of specimen other than nasopharyngeal swab, presence of viral mutation(s) within the areas targeted by this assay, and inadequate number of viral copies (<250 copies / mL). A negative result must be combined with clinical observations, patient history, and epidemiological information.  Fact Sheet for Patients:   06/28/20  Fact Sheet for Healthcare Providers: BoilerBrush.com.cy  This test is not yet approved or  cleared by the https://pope.com/ FDA and has been authorized for detection and/or diagnosis of SARS-CoV-2 by FDA under an Emergency Use Authorization (EUA).  This  EUA will remain in effect (meaning this test can be used) for the duration of the COVID-19 declaration under Section 564(b)(1) of the Act, 21 U.S.C. section 360bbb-3(b)(1), unless the authorization is terminated or revoked sooner.  Performed at [facility], [address] [place], [place]  [address]   Culture, blood (routine x 2)     Status: None   Collection Time: 04/27/20  2:37 PM   Specimen: BLOOD LEFT HAND  Result Value Ref Range Status   Specimen Description   Final    BLOOD LEFT HAND Performed at [facility], [address] [place], [place] [address]    Special Requests   Final    BOTTLES DRAWN AEROBIC AND ANAEROBIC Blood Culture results may not be optimal due to an inadequate volume of blood received in culture bottles Performed at [facility], [address] [place], [place] [address]    Culture   Final    NO GROWTH 5 DAYS Performed at [facility], [address] [place], [place] [address]    Report Status 05/02/2020 FINAL  Final  Body fluid culture (includes gram stain)     Status: None   Collection Time: 04/27/20  5:26 PM   Specimen: Peritoneal Washings; Pleural Fluid  Result Value Ref Range Status   Specimen Description   Final    PERITONEAL FLUID Performed at [facility], [address] [place], [place] [address]    Special Requests   Final    NONE Performed at [facility], [address] [place], [place] [address]    Gram Stain   Final    WBC PRESENT, PREDOMINANTLY MONONUCLEAR NO ORGANISMS SEEN CYTOSPIN SMEAR    Culture   Final    NO GROWTH 3 DAYS Performed at [facility], [address] [place], [place] [address]    Report Status 05/01/2020 FINAL  Final  MRSA PCR Screening     Status: None   Collection Time: 04/27/20  8:44 PM   Specimen: Nasopharyngeal  Result Value Ref Range Status   MRSA by PCR NEGATIVE NEGATIVE Final    Comment:        The GeneXpert MRSA Assay (FDA approved for NASAL specimens only), is one component of a comprehensive MRSA colonization surveillance program. It is not intended to diagnose MRSA infection nor to guide or monitor treatment for MRSA infections. Performed at [facility], [address]  [place], [place] [address]   MRSA PCR Screening     Status: None   Collection Time: 04/30/20  8:35 PM   Specimen: Nasopharyngeal  Result Value Ref Range Status   MRSA by PCR NEGATIVE NEGATIVE Final    Comment:        The GeneXpert MRSA Assay (FDA approved for NASAL specimens only), is one component of a comprehensive MRSA colonization surveillance program. It is not intended to diagnose MRSA infection nor to guide or monitor treatment for MRSA infections. Performed at [facility], [address] [place], [place] [address]     Radiology Studies: No results found.  Scheduled Meds: . sodium chloride   Intravenous Once  . Chlorhexidine Gluconate Cloth  6 each Topical Q0600  . lactulose  30 g Oral Daily  . mouth rinse  15 mL Mouth Rinse BID  . midodrine  15 mg Oral TID WC  . octreotide  150 mcg Subcutaneous BID  . prednisoLONE  30 mg Oral  Q breakfast  . sodium chloride flush  10-40 mL Intracatheter Q12H  . thiamine  100 mg Oral Daily   Continuous Infusions: . sodium chloride Stopped (04/30/20 1614)  . albumin human 50 g (05/02/20 0830)  . norepinephrine (LEVOPHED) Adult infusion Stopped (04/29/20 2003)     LOS: 5 days    Time spent:  Zannie Cove, MD [facility] 05/02/2020, 1:00 PM

## 2020-05-02 NOTE — Progress Notes (Signed)
Results for KC, SUMMERSON (MRN [id number]) as of 05/02/2020 06:02  Ref. Range 05/02/2020 04:26  Calcium Latest Ref Range: 8.9 - 10.3 mg/dL 6.3 (LL)  Repaged. Of current Calcium.

## 2020-05-02 NOTE — Progress Notes (Signed)
Results for EUGUNE, SINE (MRN [id number]) as of 05/02/2020 05:59  Ref. Range 04/30/2020 05:00  Calcium Latest Ref Range: 8.9 - 10.3 mg/dL 7.3 (L)  NP on call  Paged of result.

## 2020-05-02 NOTE — Progress Notes (Signed)
[facility] Progress Note  Subjective: C/o abd fullness and distention.  No fevers, chills.  No other complaints. I/Os yesterday 440 /  Vitals:   05/01/20 2203 05/02/20 0151 05/02/20 0556 05/02/20 0934  BP: 94/62 106/65 (!) 97/58 102/65  Pulse: 77 83 80 79  Resp: 18 18 18 18   Temp: 98.7 F (37.1 C) 98.7 F (37.1 C) 98.1 F (36.7 C) 97.8 F (36.6 C)  TempSrc: Oral   Oral  SpO2: (!) 89% 91% 92% 92%  Weight:      Height:        Exam:   alert, nad , diffuse jaundice   mouth moist  no jvd  Chest cta bilat  Cor reg no RG  Abd soft ntnd +2-3 ascites   Ext no sig leg or UE edema   Alert, NF, ox3    UA 7/9 - hazy, amber, 50 glu, mod Hb, +nit, 5.0, 100 prot, 0-5 rbc/ 11-20 wbc   UNa 7/9 - 20   UCr  7/9 - 231   FeNa low at 0.3%   Assessment/Plan: 1.  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 Other possibility is bile- induced tubular injury/ ATN. Urine lytes c/w HRS, FeNa < 1%. Also decreased oral intake, NSAIDs and recent ARB use could have contributed. +moderate amount of ascites and if therapeutic paracentesis is desired would remove max 4 L max with concomitant administration of intravenous albumin 25%  - weaned off levo 7/10; Sp CRRT 7/9- 7/11  - no sign of renal recovery yet, repeated HRS protocol w/ octreotide/ midodrine/ IV alb  - no HD needed today but Cr 4 today makes me think HD will likely be needed tomorrow, will reassess on daily basis  - UOP remains poor, have d/w pt  - hopefully kidney function can recover  2.  Acute decompensated alcoholic hepatitis: Avoid Tylenol/additional hepatotoxins and agree with efforts at improving hemodynamic status with intravenous Levophed/octreotide and albumin.   He has been seen by Dr. 01-08-1973 of gastroenterology and his note reviewed; currently not a candidate for liver transplantation. 3.  Acute metabolic encephalopathy: Likely secondary to hepatic encephalopathy based on elevated ammonia  level with possibly some impact from azotemia.  On lactulose. Resolved.  4.  Hypotension: Discontinued ARB/oral BP meds , getting midodrine, sp pressors  Bosie Clos MD [facility] Pager [phone]   Recent Labs  Lab 04/28/20 0544 04/28/20 1600 04/30/20 0500 05/02/20 0426  K 3.8  3.6   < > 4.0 4.6  BUN 64*  64*   < > 20 43*  CREATININE 9.18*  8.56*   < > 1.84* 4.06*  CALCIUM 6.2*  6.2*   < > 7.3* 6.3*  PHOS 7.4*  6.9*   < > 2.8 3.3  HGB 10.6*  --   --  10.3*   < > = values in this interval not displayed.   Inpatient medications: . sodium chloride   Intravenous Once  . Chlorhexidine Gluconate Cloth  6 each Topical Q0600  . lactulose  30 g Oral Daily  . mouth rinse  15 mL Mouth Rinse BID  . midodrine  15 mg Oral TID WC  . octreotide  150 mcg Subcutaneous BID  . prednisoLONE  30 mg Oral Q breakfast  . sodium chloride flush  10-40 mL Intracatheter Q12H  . thiamine  100 mg Oral Daily   . sodium chloride Stopped (04/30/20 1614)  . albumin human 50 g (05/02/20 0830)  .  chlorproMAZINE (THORAZINE) IV    . norepinephrine (LEVOPHED) Adult infusion Stopped (04/29/20 2003)   alum & mag hydroxide-simeth, docusate sodium, ondansetron (ZOFRAN) IV, oxyCODONE, polyethylene glycol, prochlorperazine, sodium chloride flush

## 2020-05-03 ENCOUNTER — Inpatient Hospital Stay (HOSPITAL_COMMUNITY): Payer: Medicaid Other

## 2020-05-03 HISTORY — PX: IR PARACENTESIS: IMG2679

## 2020-05-03 LAB — COMPREHENSIVE METABOLIC PANEL
ALT: 65 U/L — ABNORMAL HIGH (ref 0–44)
AST: 150 U/L — ABNORMAL HIGH (ref 15–41)
Albumin: 2.5 g/dL — ABNORMAL LOW (ref 3.5–5.0)
Alkaline Phosphatase: 137 U/L — ABNORMAL HIGH (ref 38–126)
Anion gap: 10 (ref 5–15)
BUN: 58 mg/dL — ABNORMAL HIGH (ref 6–20)
CO2: 20 mmol/L — ABNORMAL LOW (ref 22–32)
Calcium: 6.3 mg/dL — CL (ref 8.9–10.3)
Chloride: 98 mmol/L (ref 98–111)
Creatinine, Ser: 3.8 mg/dL — ABNORMAL HIGH (ref 0.61–1.24)
GFR calc Af Amer: 21 mL/min — ABNORMAL LOW (ref >60)
GFR calc non Af Amer: 18 mL/min — ABNORMAL LOW (ref >60)
Glucose, Bld: 126 mg/dL — ABNORMAL HIGH (ref 70–99)
Potassium: 3.8 mmol/L (ref 3.5–5.1)
Sodium: 128 mmol/L — ABNORMAL LOW (ref 135–145)
Total Bilirubin: 28.4 mg/dL (ref 0.3–1.2)
Total Protein: 5.4 g/dL — ABNORMAL LOW (ref 6.5–8.1)

## 2020-05-03 LAB — GRAM STAIN

## 2020-05-03 LAB — PROTIME-INR
INR: 2.3 — ABNORMAL HIGH (ref 0.8–1.2)
Prothrombin Time: 24.1 seconds — ABNORMAL HIGH (ref 11.4–15.2)

## 2020-05-03 LAB — CBC
HCT: 25.4 % — ABNORMAL LOW (ref 39.0–52.0)
Hemoglobin: 9.2 g/dL — ABNORMAL LOW (ref 13.0–17.0)
MCH: 36.7 pg — ABNORMAL HIGH (ref 26.0–34.0)
MCHC: 36.2 g/dL — ABNORMAL HIGH (ref 30.0–36.0)
MCV: 101.2 fL — ABNORMAL HIGH (ref 80.0–100.0)
Platelets: 85 10*3/uL — ABNORMAL LOW (ref 150–400)
RBC: 2.51 MIL/uL — ABNORMAL LOW (ref 4.22–5.81)
RDW: 21.3 % — ABNORMAL HIGH (ref 11.5–15.5)
WBC: 27.9 10*3/uL — ABNORMAL HIGH (ref 4.0–10.5)
nRBC: 0 % (ref 0.0–0.2)

## 2020-05-03 LAB — AMMONIA: Ammonia: 111 umol/L — ABNORMAL HIGH (ref 9–35)

## 2020-05-03 LAB — HEMOGLOBIN AND HEMATOCRIT, BLOOD
HCT: 25.9 % — ABNORMAL LOW (ref 39.0–52.0)
Hemoglobin: 9.5 g/dL — ABNORMAL LOW (ref 13.0–17.0)

## 2020-05-03 LAB — ALBUMIN, PLEURAL OR PERITONEAL FLUID: Albumin, Fluid: <1 g/dL

## 2020-05-03 LAB — PHOSPHORUS: Phosphorus: 3 mg/dL (ref 2.5–4.6)

## 2020-05-03 MED ORDER — PROSOURCE PLUS PO LIQD
30.0000 mL | Freq: Two times a day (BID) | ORAL | Status: DC
  Administered 2020-05-03 (×2): 30 mL via ORAL
  Filled 2020-05-03 (×4): qty 30

## 2020-05-03 MED ORDER — ENSURE ENLIVE PO LIQD
237.0000 mL | Freq: Three times a day (TID) | ORAL | Status: DC
  Administered 2020-05-03 (×3): 237 mL via ORAL

## 2020-05-03 MED ORDER — PANTOPRAZOLE SODIUM 40 MG IV SOLR
40.0000 mg | Freq: Two times a day (BID) | INTRAVENOUS | Status: DC
  Administered 2020-05-03 – 2020-05-12 (×17): 40 mg via INTRAVENOUS
  Filled 2020-05-03 (×18): qty 40

## 2020-05-03 MED ORDER — CALCIUM GLUCONATE-NACL 1-0.675 GM/50ML-% IV SOLN
1.0000 g | Freq: Once | INTRAVENOUS | Status: AC
  Administered 2020-05-03: 1000 mg via INTRAVENOUS
  Filled 2020-05-03: qty 50

## 2020-05-03 MED ORDER — SODIUM CHLORIDE 0.9 % IV SOLN
2.0000 g | INTRAVENOUS | Status: DC
  Administered 2020-05-03 – 2020-05-06 (×4): 2 g via INTRAVENOUS
  Filled 2020-05-03 (×4): qty 20

## 2020-05-03 MED ORDER — LIDOCAINE HCL 1 % IJ SOLN
INTRAMUSCULAR | Status: AC
  Filled 2020-05-03: qty 20

## 2020-05-03 NOTE — Plan of Care (Signed)
  Problem: Health Behavior/Discharge Planning: Goal: Ability to manage health-related needs will improve Outcome: Progressing   Problem: Nutrition: Goal: Adequate nutrition will be maintained Outcome: Progressing   Problem: Coping: Goal: Level of anxiety will decrease Outcome: Progressing   Problem: Clinical Measurements: Goal: Complications related to the disease process or treatment will be avoided or minimized Outcome: Progressing

## 2020-05-03 NOTE — Progress Notes (Signed)
Initial Nutrition Assessment  DOCUMENTATION CODES:   Not applicable  INTERVENTION:  Provided information on renal diet  Ensure Enlive po TID, each supplement provides 350 kcal and 20 grams of protein  63ml Prosource po BID, each supplement provides 100 kcal and 15 grams of protein  NUTRITION DIAGNOSIS:   Inadequate oral intake related to poor appetite as evidenced by per patient/family report.    GOAL:   Patient will meet greater than or equal to 90% of their needs    MONITOR:   PO intake, Labs, I & O's, Weight trends, Supplement acceptance  REASON FOR ASSESSMENT:   Consult Diet education  ASSESSMENT:   Pt admitted for acute decompensated alcoholic cirrhosis causing AKI, most likely hepatorenal syndrome. PMH includes EtOH abuse, depression/anxiety, suicidal behavior, and HTN.  7/8 diagnostic paracentesis, negative for spontaneous bacterial peritonitis 7/9 CRRT initiated  7/11 CRRT stopped 7/14 paracentesis, yield 3L  Pt unavailable at time of RD visit. Attached renal diet information to discharge instructions.   Per H&P, pt has been drinking heavily for several years. For the last 3-4 weeks, pt has been experiencing abdominal bloating/pain, fatigue, and malaise. Pt reportedly has not had any EtOH in the last 3-4 weeks. Pt has had a poor appetite.   No significant wt changes noted.  Suspect pt is malnourished; however, unable to diagnose at this time without nutrition-focused physical exam.   PO Intake: 25-100% x 5 recorded meals (51% average meal intake)  Labs: Na 128 (L), Corrected Ca 7.5 (L) Medications: chronulac, protonix, prednisolone, thiamine  UOP: x24 hours I/O: +4,154.6ml since admit   NUTRITION - FOCUSED PHYSICAL EXAM:  Unable to perform at this time, will attempt at follow-up.   Diet Order:   Diet Order            Diet renal with fluid restriction Fluid restriction: 1200 mL Fluid; Room service appropriate? Yes; Fluid consistency: Thin   Diet effective now                 EDUCATION NEEDS:   Education needs have been addressed  Skin:  Skin Assessment: Reviewed RN Assessment  Last BM:  7/13  Height:   Ht Readings from Last 1 Encounters:  04/30/20 5\' 8"  (1.727 m)    Weight:   Wt Readings from Last 1 Encounters:  05/02/20 77.6 kg   BMI:  Body mass index is 26.01 kg/m.  Estimated Nutritional Needs:   Kcal:  1950-2150  Protein:  100-115 grams  Fluid:  >1.9L/d    05/04/20, MS, RD, LDN RD pager number and weekend/on-call pager number located in Amion.

## 2020-05-03 NOTE — Procedures (Signed)
PROCEDURE SUMMARY:  Successful image-guided paracentesis from the right lower abdomen.  Yielded 3.0 liters of bright yellow fluid.  No immediate complications.  EBL: zero  Patient tolerated procedure poorly - patient writhing in bed throughout, attempting to get out of bed, refusing to keep legs straightened, c/o dyspnea with SpO2 98% on RA and all other VSS. Patient HOB elevated to 45 degrees and 2L supplement O2 via [place] placed for patient comfort with improvement in subjective dyspnea.   Specimen was sent for labs.  Please see imaging section of Epic for full dictation.  Villa Herb PA-C 05/03/2020 10:00 AM

## 2020-05-03 NOTE — Progress Notes (Signed)
PROGRESS NOTE    Alan Becker  OVF:[id number] DOB: Feb 15, 1976 DOA: 04/27/2020 PCP: [name], MD   Brief Narrative: 44 year old with past medical history significant for alcohol abuse who was brought to the ED by family who noticed that he has been very confused and having skin color changes.  He has a history of depression and has been drinking heavily for the last 8 years since his divorce.  His mother gave most of the history because, patient has been confused.  Mother reporter that patient has been experiencing abdominal bloating and pain, fatigue and malaise for the last 3 to 4 weeks.  Work-up in the ED consistent with acute renal failure creatinine of 8.5, albumin 1.9, bilirubin of 23, AST elevated and white count of 15.  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  On 7/9 he was a started on CRRT, GI renal following, underwent paracentesis on 7/8 with 30 cc of dark yellow fluid which was negative for SBP.  His care was transferred to [facility] service on 7/13.  He underwent repeated paracentesis on 7/14 results pending    Assessment & Plan:   Active Problems:   Hepatorenal syndrome (HCC)   AKI (acute kidney injury) (HCC)   Encounter for central line placement  1-acute renal failure, hepatorenal syndrome: Nephrology consulted and following. Patient was a started on midodrine/octreotide/albumin No acute indication for dialysis, plan to monitor renal function and urine output for the next several days.  Acute decompensated alcoholic liver cirrhosis -[facility] consulted.  Dr. Bosie Clos saw the patient earlier during this admission. Patient was a started on prednisone, his discriminate function was 41 He was deemed not a candidate for liver transplant given recent alcohol use Symptomatic ascites, patient underwent repeat paracentesis on 7/14 follow fluid results. Will add ceftriaxone for SBP prophylaxis due to concern for GI bleed GI will  follow up on patient.   Leukocytosis: Follow trend could be related to prednisone. Hyponatremia; might be related to renal failure, liver failure.   Concern for GI bleed: Mother noticed blood in the toilet, continue to monitor for any evidence of GI bleed.  Monitor hemoglobin. Started on IV Protonix. GI was informed of episode.  We will follow up on patient tomorrow.  Earlier if needed Repeat hemoglobin overnight  Long-term alcohol abuse: Counseling provided Continue with thiamine.  Abnormal liver lesion: CT abdomen on 7/8 noted questionable focal hypoattenuation mass in the liver not well characterized, recommend a dedicated liver protocol CT or MRI when more stable  Major depression: Not appropriate for psychotropic med in the setting of decompensated cirrhosis  Nutrition Problem: Inadequate oral intake Etiology: poor appetite    Signs/Symptoms: per patient/family report    Interventions: Ensure Enlive (each supplement provides 350kcal and 20 grams of protein), Education, Prostat  Estimated body mass index is 26.01 kg/m as calculated from the following:   Height as of this encounter: 5\' 8"  (1.727 m).   Weight as of this encounter: 77.6 kg.   DVT prophylaxis: SCDs, hold heparin Code Status: Full code Family Communication: Mother at bedside Disposition Plan:  Status is: Inpatient  Remains inpatient appropriate because:IV treatments appropriate due to intensity of illness or inability to take PO   Dispo: The patient is from: Home              Anticipated d/c is to: To be determined              Anticipated d/c date is:  3 days              Patient currently is not medically stable to d/c.        Consultants:   GI, [facility]  CCM  Procedures:     Antimicrobials:    Subjective: He underwent paracentesis, no bowel movement today.  He appears slightly confused.  Still with hiccups  Objective: Vitals:   05/03/20 0839 05/03/20 0953 05/03/20 1009  05/03/20 1646  BP: 112/72 114/69 114/69 112/71  Pulse: 78   77  Resp: 18   18  Temp: 98.1 F (36.7 C)   98.2 F (36.8 C)  TempSrc: Oral   Oral  SpO2: 90%   98%  Weight:      Height:        Intake/Output Summary (Last 24 hours) at 05/03/2020 1704 Last data filed at 05/03/2020 1302 Gross per 24 hour  Intake 578.82 ml  Output 3525 ml  Net -2946.18 ml   Filed Weights   04/30/20 2034 05/01/20 0337 05/02/20 2046  Weight: 87.4 kg 77.6 kg 77.6 kg    Examination:  General exam: chronic ill appearing, icteric Respiratory system: Clear to auscultation. Respiratory effort normal. Cardiovascular system: S1 & S2 heard, RRR. No JVD, murmurs, rubs, gallops or clicks. No pedal edema. Gastrointestinal system: Abdomen is distended, no rigidity  Central nervous system: Alert and oriented. No focal neurological deficits. Extremities: no edema   Data Reviewed: I have personally reviewed following labs and imaging studies  CBC: Recent Labs  Lab 04/27/20 1357 04/28/20 0544 05/02/20 0426 05/03/20 0413  WBC 15.6* 16.4* 28.3* 27.9*  NEUTROABS 11.7*  --   --   --   HGB 11.5* 10.6* 10.3* 9.2*  HCT 30.7* 28.2* 28.6* 25.4*  MCV 99.7 100.0 102.1* 101.2*  PLT 282 275 132* 85*   Basic Metabolic Panel: Recent Labs  Lab 04/27/20 1357 04/27/20 2350 04/28/20 0544 04/28/20 1600 04/29/20 0451 04/29/20 1600 04/30/20 0500 05/02/20 0426 05/03/20 0413  NA   < >  --  134*  134*   < > 133* 135 137 131* 128*  K   < >  --  3.8  3.6   < > 3.8 3.6 4.0 4.6 3.8  CL   < >  --  92*  94*   < > 98 102 101 98 98  CO2   < >  --  20*  20*   < > 23 25 25 22  20*  GLUCOSE   < >  --  91  89   < > 117* 101* 96 100* 126*  BUN   < >  --  64*  64*   < > 36* 25* 20 43* 58*  CREATININE   < >  --  9.18*  8.56*   < > 4.41* 2.85* 1.84* 4.06* 3.80*  CALCIUM   < >  --  6.2*  6.2*   < > 7.0* 6.6* 7.3* 6.3* 6.3*  MG  --  1.3* 1.2*  --  2.0  --  2.3  --   --   PHOS  --   --  7.4*  6.9*   < > 4.7* 3.5 2.8 3.3 3.0     < > = values in this interval not displayed.   GFR: Estimated Creatinine Clearance: 24 mL/min (A) (by C-G formula based on SCr of 3.8 mg/dL (H)). Liver Function Tests: Recent Labs  Lab 04/27/20 1357 04/27/20 1357 04/28/20 0544 04/28/20 1600 04/29/20 0451 04/29/20 1600  04/30/20 0500 05/02/20 0426 05/03/20 0413  AST 222*  --  183*  --   --   --   --  213* 150*  ALT 64*  --  53*  --   --   --   --  72* 65*  ALKPHOS 227*  --  165*  --   --   --   --  178* 137*  BILITOT 23.9*  --  25.0*  --   --   --   --  27.5* 28.4*  PROT 5.7*  --  5.7*  --   --   --   --  5.3* 5.4*  ALBUMIN 1.9*   < > 2.5*  2.6*   < > 2.4* 2.2* 2.2* 2.1* 2.5*   < > = values in this interval not displayed.   No results for input(s): LIPASE, AMYLASE in the last 168 hours. Recent Labs  Lab 04/27/20 1357  AMMONIA 64*   Coagulation Profile: Recent Labs  Lab 04/27/20 1357  INR 2.8*   Cardiac Enzymes: No results for input(s): CKTOTAL, CKMB, CKMBINDEX, TROPONINI in the last 168 hours. BNP (last 3 results) No results for input(s): PROBNP in the last 8760 hours. HbA1C: No results for input(s): HGBA1C in the last 72 hours. CBG: Recent Labs  Lab 04/30/20 2031  GLUCAP 81   Lipid Profile: No results for input(s): CHOL, HDL, LDLCALC, TRIG, CHOLHDL, LDLDIRECT in the last 72 hours. Thyroid Function Tests: No results for input(s): TSH, T4TOTAL, FREET4, T3FREE, THYROIDAB in the last 72 hours. Anemia Panel: No results for input(s): VITAMINB12, FOLATE, FERRITIN, TIBC, IRON, RETICCTPCT in the last 72 hours. Sepsis Labs: Recent Labs  Lab 04/27/20 1357 04/27/20 1650  LATICACIDVEN 2.9* 3.0*    Recent Results (from the past 240 hour(s))  Culture, blood (routine x 2)     Status: None   Collection Time: 04/27/20  1:57 PM   Specimen: BLOOD  Result Value Ref Range Status   Specimen Description   Final    BLOOD RIGHT ANTECUBITAL Performed at [facility], [address], [place], [place]  [address]    Special Requests   Final    BOTTLES DRAWN AEROBIC AND ANAEROBIC Blood Culture results may not be optimal due to an inadequate volume of blood received in culture bottles Performed at [facility], [address] [place], [place] [address]    Culture   Final    NO GROWTH 5 DAYS Performed at [facility], [address] [place], [place] [address]    Report Status 05/02/2020 FINAL  Final  SARS Coronavirus 2 by RT PCR (hospital order, performed in [facility] hospital lab) Nasopharyngeal Nasopharyngeal Swab     Status: None   Collection Time: 04/27/20  1:57 PM   Specimen: Nasopharyngeal Swab  Result Value Ref Range Status   SARS Coronavirus 2 NEGATIVE NEGATIVE Final    Comment: (NOTE) SARS-CoV-2 target nucleic acids are NOT DETECTED.  The SARS-CoV-2 RNA is generally detectable in upper and lower respiratory specimens during the acute phase of infection. The lowest concentration of SARS-CoV-2 viral copies this assay can detect is 250 copies / mL. A negative result does not preclude SARS-CoV-2 infection and should not be used as the sole basis for treatment or other patient management decisions.  A negative result may occur with improper specimen collection / handling, submission of specimen other than nasopharyngeal swab, presence of viral mutation(s) within the areas targeted by this assay, and inadequate number  of viral copies (<250 copies / mL). A negative result must be combined with clinical observations, patient history, and epidemiological information.  Fact Sheet for Patients:   BoilerBrush.com.cy  Fact Sheet for Healthcare Providers: https://pope.com/  This test is not yet approved or  cleared by the [place] FDA and has been authorized for detection and/or diagnosis of SARS-CoV-2 by FDA under an Emergency Use Authorization (EUA).  This EUA will remain in effect (meaning this test can  be used) for the duration of the COVID-19 declaration under Section 564(b)(1) of the Act, 21 U.S.C. section 360bbb-3(b)(1), unless the authorization is terminated or revoked sooner.  Performed at [facility], [address] [place], [place] [address]   Culture, blood (routine x 2)     Status: None   Collection Time: 04/27/20  2:37 PM   Specimen: BLOOD LEFT HAND  Result Value Ref Range Status   Specimen Description   Final    BLOOD LEFT HAND Performed at [facility], [address], [place], [place] [address]    Special Requests   Final    BOTTLES DRAWN AEROBIC AND ANAEROBIC Blood Culture results may not be optimal due to an inadequate volume of blood received in culture bottles Performed at [facility], [address], [place], [place] [address]    Culture   Final    NO GROWTH 5 DAYS Performed at [facility], [address] [place], [place] [address]    Report Status 05/02/2020 FINAL  Final  Body fluid culture (includes gram stain)     Status: None   Collection Time: 04/27/20  5:26 PM   Specimen: Peritoneal Washings; Pleural Fluid  Result Value Ref Range Status   Specimen Description   Final    PERITONEAL FLUID Performed at [facility], [address], [place], [place] [address]    Special Requests   Final    NONE Performed at [facility], [address] [place], [place] [address]    Gram Stain   Final    WBC PRESENT, PREDOMINANTLY MONONUCLEAR NO ORGANISMS SEEN CYTOSPIN SMEAR    Culture   Final    NO GROWTH 3 DAYS Performed at [facility], [address], [place], [place] [address]    Report Status 05/01/2020 FINAL  Final  MRSA PCR Screening     Status: None   Collection Time: 04/27/20  8:44 PM   Specimen: Nasopharyngeal  Result Value Ref Range Status   MRSA by PCR NEGATIVE NEGATIVE Final    Comment:        The GeneXpert MRSA Assay (FDA approved for NASAL  specimens only), is one component of a comprehensive MRSA colonization surveillance program. It is not intended to diagnose MRSA infection nor to guide or monitor treatment for MRSA infections. Performed at [facility], [address], [place], [place] [address]   MRSA PCR Screening     Status: None   Collection Time: 04/30/20  8:35 PM   Specimen: Nasopharyngeal  Result Value Ref Range Status   MRSA by PCR NEGATIVE NEGATIVE Final    Comment:        The GeneXpert MRSA Assay (FDA approved for NASAL specimens only), is one component of a comprehensive MRSA colonization surveillance program. It is not intended to diagnose MRSA infection nor to guide or monitor treatment for MRSA infections. Performed at [facility], [address], [place], [place] [address]  Gram stain     Status: None   Collection Time: 05/03/20  1:10 PM   Specimen: Abdomen; Peritoneal Fluid  Result Value Ref Range Status   Specimen Description ABDOMEN  Final   Special Requests PERITONEAL FLUID  Final   Gram Stain   Final    WBC PRESENT,BOTH PMN AND MONONUCLEAR NO ORGANISMS SEEN CYTOSPIN SMEAR Performed at [facility], [address] [place], [place] [address]    Report Status 05/03/2020 FINAL  Final         Radiology Studies: IR Paracentesis  Result Date: 05/03/2020 INDICATION: Patient with acute decompensated alcoholic liver cirrhosis, hepatic renal syndrome, ascites. Request IR for diagnostic and therapeutic paracentesis with 3-4 L maximum. EXAM: ULTRASOUND GUIDED DIAGNOSTIC AND THERAPEUTIC PARACENTESIS MEDICATIONS: 10 mL 1% lidocaine COMPLICATIONS: None immediate. PROCEDURE: Informed written consent was obtained from the patient after a discussion of the risks, benefits and alternatives to treatment. A timeout was performed prior to the initiation of the procedure. Initial ultrasound scanning demonstrates a large amount of ascites within the right lower abdominal  quadrant. The right lower abdomen was prepped and draped in the usual sterile fashion. 1% lidocaine was used for local anesthesia. Following this, a 6 Fr Safe-T-Centesis catheter was introduced. An ultrasound image was saved for documentation purposes. The paracentesis was performed. The catheter was removed and a dressing was applied. The patient tolerated the procedure well without immediate post procedural complication. Patient received post-procedure intravenous albumin; see nursing notes for details. FINDINGS: A total of approximately 3.0 L of bright yellow fluid was removed. Samples were sent to the laboratory as requested by the clinical team. IMPRESSION: Successful ultrasound-guided paracentesis yielding 3.0 liters of peritoneal fluid. Read by Lynnette Caffey, PA-C Electronically Signed   By: Richarda Overlie M.D.   On: 05/03/2020 10:46        Scheduled Meds: . (feeding supplement) PROSource Plus  30 mL Oral BID BM  . sodium chloride   Intravenous Once  . Chlorhexidine Gluconate Cloth  6 each Topical Q0600  . feeding supplement (ENSURE ENLIVE)  237 mL Oral TID BM  . lactulose  30 g Oral Daily  . lidocaine      . mouth rinse  15 mL Mouth Rinse BID  . midodrine  15 mg Oral TID WC  . octreotide  150 mcg Subcutaneous BID  . pantoprazole (PROTONIX) IV  40 mg Intravenous Q12H  . prednisoLONE  30 mg Oral Q breakfast  . sodium chloride flush  10-40 mL Intracatheter Q12H  . thiamine  100 mg Oral Daily   Continuous Infusions: . sodium chloride Stopped (04/30/20 1614)  . cefTRIAXone (ROCEPHIN)  IV 2 g (05/03/20 1402)  . chlorproMAZINE (THORAZINE) IV       LOS: 6 days    Time spent: 35 minutes.     Alba Cory, MD [facility]   If 7PM-7AM, please contact night-coverage [email or web address]  05/03/2020, 5:04 PM

## 2020-05-03 NOTE — Progress Notes (Signed)
[facility] Progress Note  Subjective: s/p 3L LVP today in IR, feels abd more comfortable; per IR note he tolerated it poorly.  He has no new complaints for me and remains quiet. I/Os yesterday 890 /550.   Vitals:   05/03/20 0426 05/03/20 0839 05/03/20 0953 05/03/20 1009  BP: 100/69 112/72 114/69 114/69  Pulse: 74 78    Resp: 19 18    Temp: 98.8 F (37.1 C) 98.1 F (36.7 C)    TempSrc: Oral Oral    SpO2: 91% 90%    Weight:      Height:        Exam: Gen: comfortable, jaundiced ENT: MMM CV: RRR Lungs: clear ant Abd: soft, less distended than yesterday but still somewhat Extr: trace leg edema Neuro: nonfocal Psych: poor eye contact, blunted affect.  Assessment/Plan: 1.  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 Other possibility is bile- induced tubular injury/ ATN. Urine lytes c/w HRS, FeNa < 1%. Also decreased oral intake, NSAIDs and recent ARB use could have contributed. +moderate amount of ascites and if therapeutic paracentesis is desired would remove max 4 L max with concomitant administration of intravenous albumin 25%  - weaned off levo 7/10; Sp CRRT 7/9- 7/11  - Being treated with midodrine, octreotide, albumin for HRS --> responding, UOP improved and creatinine slightly improved from 4 to 3.8.   - Stop albumin today post 48h  - hopefully kidney function can recover but still to be determined; no HD today, cont daily assessment.  2.  Acute decompensated alcoholic hepatitis:He has been seen by Dr. Bosie Clos of gastroenterology and his note reviewed; currently not a candidate for liver transplantation.  Supportive care.   3.  Acute metabolic encephalopathy: Likely secondary to hepatic encephalopathy based on elevated ammonia level with possibly some impact from azotemia.  On lactulose. Resolved.   4.  Hypotension: Discontinued ARB/oral BP meds , getting midodrine, sp pressors  Estill Bakes MD [facility] Pager  [phone]   Recent Labs  Lab 05/02/20 0426 05/03/20 0413  K 4.6 3.8  BUN 43* 58*  CREATININE 4.06* 3.80*  CALCIUM 6.3* 6.3*  PHOS 3.3 3.0  HGB 10.3* 9.2*   Inpatient medications: . (feeding supplement) PROSource Plus  30 mL Oral BID BM  . sodium chloride   Intravenous Once  . Chlorhexidine Gluconate Cloth  6 each Topical Q0600  . feeding supplement (ENSURE ENLIVE)  237 mL Oral TID BM  . lactulose  30 g Oral Daily  . lidocaine      . mouth rinse  15 mL Mouth Rinse BID  . midodrine  15 mg Oral TID WC  . octreotide  150 mcg Subcutaneous BID  . pantoprazole  40 mg Oral Q1200  . prednisoLONE  30 mg Oral Q breakfast  . sodium chloride flush  10-40 mL Intracatheter Q12H  . thiamine  100 mg Oral Daily   . sodium chloride Stopped (04/30/20 1614)  . albumin human 60 mL/hr at 05/03/20 1100  . chlorproMAZINE (THORAZINE) IV     alum & mag hydroxide-simeth, docusate sodium, ondansetron (ZOFRAN) IV, oxyCODONE, polyethylene glycol, prochlorperazine, sodium chloride flush

## 2020-05-04 ENCOUNTER — Inpatient Hospital Stay (HOSPITAL_COMMUNITY): Payer: Medicaid Other

## 2020-05-04 LAB — GLUCOSE, CAPILLARY
Glucose-Capillary: 125 mg/dL — ABNORMAL HIGH (ref 70–99)
Glucose-Capillary: 136 mg/dL — ABNORMAL HIGH (ref 70–99)
Glucose-Capillary: 137 mg/dL — ABNORMAL HIGH (ref 70–99)

## 2020-05-04 LAB — BPAM FFP
Blood Product Expiration Date: 202107132359
Blood Product Expiration Date: 202107132359
Unit Type and Rh: 6200
Unit Type and Rh: 6200

## 2020-05-04 LAB — BODY FLUID CELL COUNT WITH DIFFERENTIAL
Eos, Fluid: 0 %
Lymphs, Fluid: 25 %
Monocyte-Macrophage-Serous Fluid: 36 % — ABNORMAL LOW (ref 50–90)
Neutrophil Count, Fluid: 39 % — ABNORMAL HIGH (ref 0–25)
Total Nucleated Cell Count, Fluid: 196 cu mm (ref 0–1000)

## 2020-05-04 LAB — URINALYSIS, ROUTINE W REFLEX MICROSCOPIC
Bacteria, UA: NONE SEEN
Glucose, UA: NEGATIVE mg/dL
Ketones, ur: NEGATIVE mg/dL
Leukocytes,Ua: NEGATIVE
Nitrite: NEGATIVE
Protein, ur: NEGATIVE mg/dL
Specific Gravity, Urine: 1.016 (ref 1.005–1.030)
pH: 5 (ref 5.0–8.0)

## 2020-05-04 LAB — BLOOD GAS, ARTERIAL
Acid-base deficit: 3.4 mmol/L — ABNORMAL HIGH (ref 0.0–2.0)
Bicarbonate: 19.2 mmol/L — ABNORMAL LOW (ref 20.0–28.0)
Drawn by: 53307
FIO2: 28
O2 Saturation: 92.4 %
Patient temperature: 37
pCO2 arterial: 23.8 mmHg — ABNORMAL LOW (ref 32.0–48.0)
pH, Arterial: 7.516 — ABNORMAL HIGH (ref 7.350–7.450)
pO2, Arterial: 65.2 mmHg — ABNORMAL LOW (ref 83.0–108.0)

## 2020-05-04 LAB — PREPARE FRESH FROZEN PLASMA
Unit division: 0
Unit division: 0

## 2020-05-04 LAB — COMPREHENSIVE METABOLIC PANEL
ALT: 66 U/L — ABNORMAL HIGH (ref 0–44)
AST: 151 U/L — ABNORMAL HIGH (ref 15–41)
Albumin: 2.4 g/dL — ABNORMAL LOW (ref 3.5–5.0)
Alkaline Phosphatase: 142 U/L — ABNORMAL HIGH (ref 38–126)
Anion gap: 12 (ref 5–15)
BUN: 66 mg/dL — ABNORMAL HIGH (ref 6–20)
CO2: 21 mmol/L — ABNORMAL LOW (ref 22–32)
Calcium: 6.8 mg/dL — ABNORMAL LOW (ref 8.9–10.3)
Chloride: 99 mmol/L (ref 98–111)
Creatinine, Ser: 3.43 mg/dL — ABNORMAL HIGH (ref 0.61–1.24)
GFR calc Af Amer: 24 mL/min — ABNORMAL LOW (ref >60)
GFR calc non Af Amer: 21 mL/min — ABNORMAL LOW (ref >60)
Glucose, Bld: 121 mg/dL — ABNORMAL HIGH (ref 70–99)
Potassium: 3.6 mmol/L (ref 3.5–5.1)
Sodium: 132 mmol/L — ABNORMAL LOW (ref 135–145)
Total Bilirubin: 31.3 mg/dL (ref 0.3–1.2)
Total Protein: 5.7 g/dL — ABNORMAL LOW (ref 6.5–8.1)

## 2020-05-04 LAB — CBC
HCT: 27.8 % — ABNORMAL LOW (ref 39.0–52.0)
Hemoglobin: 10.1 g/dL — ABNORMAL LOW (ref 13.0–17.0)
MCH: 37 pg — ABNORMAL HIGH (ref 26.0–34.0)
MCHC: 36.3 g/dL — ABNORMAL HIGH (ref 30.0–36.0)
MCV: 101.8 fL — ABNORMAL HIGH (ref 80.0–100.0)
Platelets: 81 10*3/uL — ABNORMAL LOW (ref 150–400)
RBC: 2.73 MIL/uL — ABNORMAL LOW (ref 4.22–5.81)
RDW: 21.3 % — ABNORMAL HIGH (ref 11.5–15.5)
WBC: 29.4 10*3/uL — ABNORMAL HIGH (ref 4.0–10.5)
nRBC: 0 % (ref 0.0–0.2)

## 2020-05-04 LAB — PROTIME-INR
INR: 2.2 — ABNORMAL HIGH (ref 0.8–1.2)
Prothrombin Time: 23.7 seconds — ABNORMAL HIGH (ref 11.4–15.2)

## 2020-05-04 LAB — MAGNESIUM: Magnesium: 2.4 mg/dL (ref 1.7–2.4)

## 2020-05-04 LAB — PHOSPHORUS
Phosphorus: 3.3 mg/dL (ref 2.5–4.6)
Phosphorus: 3.7 mg/dL (ref 2.5–4.6)

## 2020-05-04 MED ORDER — PROSOURCE TF PO LIQD
45.0000 mL | Freq: Two times a day (BID) | ORAL | Status: DC
  Administered 2020-05-04 – 2020-05-10 (×11): 45 mL
  Filled 2020-05-04 (×13): qty 45

## 2020-05-04 MED ORDER — PHYTONADIONE 5 MG PO TABS
10.0000 mg | ORAL_TABLET | Freq: Once | ORAL | Status: AC
  Administered 2020-05-04: 10 mg via ORAL
  Filled 2020-05-04: qty 2

## 2020-05-04 MED ORDER — LACTULOSE 10 GM/15ML PO SOLN: 10.0000 g | Freq: Two times a day (BID) | ORAL | Status: DC | PRN

## 2020-05-04 MED ORDER — IOHEXOL 300 MG/ML  SOLN
50.0000 mL | Freq: Once | INTRAMUSCULAR | Status: AC | PRN
  Administered 2020-05-04: 15 mL

## 2020-05-04 MED ORDER — VITAL 1.5 CAL PO LIQD
1000.0000 mL | ORAL | Status: DC
  Administered 2020-05-04 – 2020-05-08 (×4): 1000 mL
  Filled 2020-05-04 (×10): qty 1000

## 2020-05-04 MED ORDER — LIDOCAINE VISCOUS HCL 2 % MT SOLN
15.0000 mL | Freq: Once | OROMUCOSAL | Status: AC
  Administered 2020-05-04: 6 mL via OROMUCOSAL

## 2020-05-04 MED ORDER — LACTULOSE 10 GM/15ML PO SOLN: 30.0000 g | Freq: Three times a day (TID) | ORAL | Status: DC

## 2020-05-04 MED ORDER — LACTULOSE ENEMA: 300.0000 mL | Freq: Two times a day (BID) | ORAL | Status: DC

## 2020-05-04 MED ORDER — LACTULOSE 10 GM/15ML PO SOLN
20.0000 g | Freq: Three times a day (TID) | ORAL | Status: DC
  Administered 2020-05-04 – 2020-05-12 (×22): 20 g via ORAL
  Filled 2020-05-04 (×24): qty 30

## 2020-05-04 MED ORDER — ALBUTEROL SULFATE (2.5 MG/3ML) 0.083% IN NEBU: 2.5000 mg | INHALATION_SOLUTION | Freq: Four times a day (QID) | RESPIRATORY_TRACT | Status: DC | PRN

## 2020-05-04 MED ORDER — RIFAXIMIN 550 MG PO TABS
550.0000 mg | ORAL_TABLET | Freq: Two times a day (BID) | ORAL | Status: DC
  Administered 2020-05-05 – 2020-05-12 (×14): 550 mg via ORAL
  Filled 2020-05-04 (×19): qty 1

## 2020-05-04 NOTE — Progress Notes (Signed)
PT Cancellation Note  Patient Details Name: Alan Becker MRN: [id number] DOB: Oct 26, 1975   Cancelled Treatment:    Reason Eval/Treat Not Completed: Patient at procedure or test/unavailable (ultrasound). PT will continue to f/u with pt acutely as available.    [name] 05/04/2020, 8:29 AM

## 2020-05-04 NOTE — Progress Notes (Signed)
PROGRESS NOTE    Alan Becker  KVQ:[id number] DOB: 27-Mar-1976 DOA: 04/27/2020 PCP: [name], MD   Brief Narrative: 44 year old with past medical history significant for alcohol abuse who was brought to the ED by family who noticed that he has been very confused and having skin color changes.  He has a history of depression and has been drinking heavily for the last 8 years since his divorce.  His mother gave most of the history because, patient has been confused.  Mother reporter that patient has been experiencing abdominal bloating and pain, fatigue and malaise for the last 3 to 4 weeks.  Work-up in the ED consistent with acute renal failure creatinine of 8.5, albumin 1.9, bilirubin of 23, AST elevated and white count of 15.  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  On 7/9 he was a started on CRRT, GI renal following, underwent paracentesis on 7/8 with 30 cc of dark yellow fluid which was negative for SBP.  His care was transferred to [facility] service on 7/13.  He underwent repeated paracentesis on 7/14 yielding 3 L of fluid.   Patient develops worsening encephalopathy on 7/15, not following command, more lethargic. CCM consulted for further evaluation and concern for decompensation. Plan to transfer to progressive care unit, support care, NG tube placement for medications.     Assessment & Plan:   Active Problems:   Hepatorenal syndrome (HCC)   AKI (acute kidney injury) (HCC)   Encounter for central line placement  1-Acute renal failure, hepatorenal syndrome: Nephrology consulted and following. Patient was a started on midodrine/octreotide/albumin No acute indication for dialysis, plan to monitor renal function and urine output for the next several days. Urine out put yesterday 600cc.   Acute decompensated alcoholic liver cirrhosis -[facility] consulted.  Dr. Bosie Clos saw the patient earlier during this admission. -Patient was a started  on prednisone, his discriminate function was 16 -He was deemed not a candidate for liver transplant given recent alcohol use -Symptomatic ascites, patient underwent repeat paracentesis on 7/14 follow fluid results: culture no growth to date. Pathology report pending, WBC 196. On empiric antibiotics.  -Added ceftriaxone for SBP prophylaxis due to concern for GI bleed -Worsening mental status, ammonium up, plan for rifaximin and lactulose pre NG tube.  -CCM consulted. Transfer to progressive care.   Acute hypoxic, respiratory failure; increase oxygen supply.  Check chest x ray.   Respiratory alkalosis; support care   Leukocytosis: Follow trend could be related to prednisone. On IV ceftriaxone to cover for infectious process.   Hyponatremia; might be related to renal failure, liver failure.   Concern for GI bleed: Mother noticed blood in the toilet, continue to monitor for any evidence of GI bleed.  Monitor hemoglobin. Started on IV Protonix. GI was informed of episode.  We will follow up on patient tomorrow.  Earlier if needed Hb has remain stable   Long-term alcohol abuse: Counseling provided Continue with thiamine.  Abnormal liver lesion: CT abdomen on 7/8 noted questionable focal hypoattenuation mass in the liver not well characterized, recommend a dedicated liver protocol CT or MRI when more stable  Major depression: Not appropriate for psychotropic med in the setting of decompensated cirrhosis  Nutrition Problem: Inadequate oral intake Etiology: poor appetite    Signs/Symptoms: per patient/family report    Interventions: Ensure Enlive (each supplement provides 350kcal and 20 grams of protein), Education, Prostat  Estimated body mass index is 26.01 kg/m as calculated from the  following:   Height as of this encounter:  (1.727 m).   Weight as of this encounter: 77.6 kg.   DVT prophylaxis: SCDs, hold heparin Code Status: Full code Family Communication: Mother at  bedside, plan for palliative care consult. Patient with poor prognosis.  Disposition Plan:  Status is: Inpatient  Remains inpatient appropriate because:IV treatments appropriate due to intensity of illness or inability to take PO   Dispo: The patient is from: Home              Anticipated d/c is to: To be determined              Anticipated d/c date is: 3 days              Patient currently is not medically stable to d/c.        Consultants:   GI, [facility]  CCM  Procedures:     Antimicrobials:    Subjective: He is more lethargic today, not following command, repeating himself, only said " what" Objective: Vitals:   05/03/20 1009 05/03/20 1646 05/03/20 2029 05/04/20 0426  BP: 114/69 112/71 111/71 (!) 107/56  Pulse:  77 72 82  Resp:  Temp:  98.2 F (36.8 C) 98.4 F (36.9 C) 98.1 F (36.7 C)  TempSrc:  Oral  Oral  SpO2:  98% 90% 93%  Weight:   77.6 kg   Height:        Intake/Output Summary (Last 24 hours) at 05/04/2020 0935 Last data filed at 05/04/2020 0600 Gross per 24 hour  Intake 748.82 ml  Output 3600 ml  Net -2851.18 ml   Filed Weights   05/01/20 0337 05/02/20 2046 05/03/20 2029  Weight: 77.6 kg 77.6 kg 77.6 kg    Examination:  General exam: Chronic ill appearing, icteric, lethargic Respiratory system: decreased breath sounds Cardiovascular system: S 1, S 2 RRR Gastrointestinal system: BS present, soft, distened Central nervous system: not following command, lethargic Extremities:No edema   Data Reviewed: I have personally reviewed following labs and imaging studies  CBC: Recent Labs  Lab 04/27/20 1357 04/27/20 1357 04/28/20 0544 05/02/20 0426 05/03/20 0413 05/03/20 2016 05/04/20 0406  WBC 15.6*  --  16.4* 28.3* 27.9*  --  29.4*  NEUTROABS 11.7*  --   --   --   --   --   --   HGB 11.5*   < > 10.6* 10.3* 9.2* 9.5* 10.1*  HCT 30.7*   < > 28.2* 28.6* 25.4* 25.9* 27.8*  MCV 99.7  --  100.0 102.1* 101.2*  --  101.8*    PLT 282  --  275 132* 85*  --  81*   < > = values in this interval not displayed.   Basic Metabolic Panel: Recent Labs  Lab 04/27/20 1357 04/27/20 2350 04/28/20 0544 04/28/20 1600 04/29/20 0451 04/29/20 0451 04/29/20 1600 04/30/20 0500 05/02/20 0426 05/03/20 0413 05/04/20 0406  NA   < >  --  134*  134*   < > 133*   < > 135 137 131* 128* 132*  K   < >  --  3.8  3.6   < > 3.8   < > 3.6 4.0 4.6 3.8 3.6  CL   < >  --  92*  94*   < > 98   < > 102 101 98 98 99  CO2   < >  --  20*  20*   < > 23   < >  25 25 22  20* 21*  GLUCOSE   < >  --  91  89   < > 117*   < > 101* 96 100* 126* 121*  BUN   < >  --  64*  64*   < > 36*   < > 25* 20 43* 58* 66*  CREATININE   < >  --  9.18*  8.56*   < > 4.41*   < > 2.85* 1.84* 4.06* 3.80* 3.43*  CALCIUM   < >  --  6.2*  6.2*   < > 7.0*   < > 6.6* 7.3* 6.3* 6.3* 6.8*  MG  --  1.3* 1.2*  --  2.0  --   --  2.3  --   --   --   PHOS  --   --  7.4*  6.9*   < > 4.7*   < > 3.5 2.8 3.3 3.0 3.3   < > = values in this interval not displayed.   GFR: Estimated Creatinine Clearance: 26.6 mL/min (A) (by C-G formula based on SCr of 3.43 mg/dL (H)). Liver Function Tests: Recent Labs  Lab 04/27/20 1357 04/27/20 1357 04/28/20 0544 04/28/20 1600 04/29/20 1600 04/30/20 0500 05/02/20 0426 05/03/20 0413 05/04/20 0406  AST 222*  --  183*  --   --   --  213* 150* 151*  ALT 64*  --  53*  --   --   --  72* 65* 66*  ALKPHOS 227*  --  165*  --   --   --  178* 137* 142*  BILITOT 23.9*  --  25.0*  --   --   --  27.5* 28.4* 31.3*  PROT 5.7*  --  5.7*  --   --   --  5.3* 5.4* 5.7*  ALBUMIN 1.9*   < > 2.5*  2.6*   < > 2.2* 2.2* 2.1* 2.5* 2.4*   < > = values in this interval not displayed.   No results for input(s): LIPASE, AMYLASE in the last 168 hours. Recent Labs  Lab 04/27/20 1357 05/03/20 2016  AMMONIA 64* 111*   Coagulation Profile: Recent Labs  Lab 04/27/20 1357 05/03/20 2016 05/04/20 0406  INR 2.8* 2.3* 2.2*   Cardiac Enzymes: No results for  input(s): CKTOTAL, CKMB, CKMBINDEX, TROPONINI in the last 168 hours. BNP (last 3 results) No results for input(s): PROBNP in the last 8760 hours. HbA1C: No results for input(s): HGBA1C in the last 72 hours. CBG: Recent Labs  Lab 04/30/20 2031  GLUCAP 81   Lipid Profile: No results for input(s): CHOL, HDL, LDLCALC, TRIG, CHOLHDL, LDLDIRECT in the last 72 hours. Thyroid Function Tests: No results for input(s): TSH, T4TOTAL, FREET4, T3FREE, THYROIDAB in the last 72 hours. Anemia Panel: No results for input(s): VITAMINB12, FOLATE, FERRITIN, TIBC, IRON, RETICCTPCT in the last 72 hours. Sepsis Labs: Recent Labs  Lab 04/27/20 1357 04/27/20 1650  LATICACIDVEN 2.9* 3.0*    Recent Results (from the past 240 hour(s))  Culture, blood (routine x 2)     Status: None   Collection Time: 04/27/20  1:57 PM   Specimen: BLOOD  Result Value Ref Range Status   Specimen Description   Final    BLOOD RIGHT ANTECUBITAL Performed at [facility], [address], [place], [place]    Special Requests   Final    BOTTLES DRAWN AEROBIC AND ANAEROBIC Blood Culture results may not be optimal due to an inadequate volume of  blood received in culture bottles Performed at [facility], [address] [place], [place] [address]    Culture   Final    NO GROWTH 5 DAYS Performed at [facility], [address] [place], [place] [address]    Report Status 05/02/2020 FINAL  Final  SARS Coronavirus 2 by RT PCR (hospital order, performed in [facility] hospital lab) Nasopharyngeal Nasopharyngeal Swab     Status: None   Collection Time: 04/27/20  1:57 PM   Specimen: Nasopharyngeal Swab  Result Value Ref Range Status   SARS Coronavirus 2 NEGATIVE NEGATIVE Final    Comment: (NOTE) SARS-CoV-2 target nucleic acids are NOT DETECTED.  The SARS-CoV-2 RNA is generally detectable in upper and lower respiratory specimens during the acute phase of infection. The  lowest concentration of SARS-CoV-2 viral copies this assay can detect is 250 copies / mL. A negative result does not preclude SARS-CoV-2 infection and should not be used as the sole basis for treatment or other patient management decisions.  A negative result may occur with improper specimen collection / handling, submission of specimen other than nasopharyngeal swab, presence of viral mutation(s) within the areas targeted by this assay, and inadequate number of viral copies (<250 copies / mL). A negative result must be combined with clinical observations, patient history, and epidemiological information.  Fact Sheet for Patients:   BoilerBrush.com.cyhttps://www.fda.gov/media/136312/download  Fact Sheet for Healthcare Providers: https://pope.com/https://www.fda.gov/media/136313/download  This test is not yet approved or  cleared by the [place] FDA and has been authorized for detection and/or diagnosis of SARS-CoV-2 by FDA under an Emergency Use Authorization (EUA).  This EUA will remain in effect (meaning this test can be used) for the duration of the COVID-19 declaration under Section 564(b)(1) of the Act, 21 U.S.C. section 360bbb-3(b)(1), unless the authorization is terminated or revoked sooner.  Performed at [facility], [address] [place], [place] [address]   Culture, blood (routine x 2)     Status: None   Collection Time: 04/27/20  2:37 PM   Specimen: BLOOD LEFT HAND  Result Value Ref Range Status   Specimen Description   Final    BLOOD LEFT HAND Performed at [facility], [address] [place], [place] [address]    Special Requests   Final    BOTTLES DRAWN AEROBIC AND ANAEROBIC Blood Culture results may not be optimal due to an inadequate volume of blood received in culture bottles Performed at [facility], [address] [place], [place] [address]    Culture   Final    NO GROWTH 5 DAYS Performed at [facility], [address] [place], [place] [address]    Report Status 05/02/2020 FINAL  Final  Body fluid culture (includes gram stain)     Status: None   Collection Time: 04/27/20  5:26 PM   Specimen: Peritoneal Washings; Pleural Fluid  Result Value Ref Range Status   Specimen Description   Final    PERITONEAL FLUID Performed at [facility], [address] [place], [place] [address]    Special Requests   Final    NONE Performed at [facility], [address] [place], [place] [address]    Gram Stain   Final    WBC PRESENT, PREDOMINANTLY MONONUCLEAR NO ORGANISMS SEEN CYTOSPIN SMEAR    Culture   Final    NO GROWTH 3 DAYS Performed at [facility], [address], [place], [place] [address]    Report Status 05/01/2020 FINAL  Final  MRSA PCR Screening     Status: None   Collection Time: 04/27/20  8:44 PM   Specimen: Nasopharyngeal  Result Value Ref Range Status   MRSA by PCR NEGATIVE NEGATIVE Final    Comment:        The GeneXpert MRSA Assay (FDA approved for NASAL specimens only), is one component of a comprehensive MRSA colonization surveillance program. It is not intended to diagnose MRSA infection nor to guide or monitor treatment for MRSA infections. Performed at [facility], [address] [place], [place] [address]   MRSA PCR Screening     Status: None   Collection Time: 04/30/20  8:35 PM   Specimen: Nasopharyngeal  Result Value Ref Range Status   MRSA by PCR NEGATIVE NEGATIVE Final    Comment:        The GeneXpert MRSA Assay (FDA approved for NASAL specimens only), is one component of a comprehensive MRSA colonization surveillance program. It is not intended to diagnose MRSA infection nor to guide or monitor treatment for MRSA infections. Performed at [facility], [address] [place], [place] [address]   Gram stain     Status: None   Collection Time: 05/03/20  1:10 PM   Specimen: Abdomen; Peritoneal Fluid    Result Value Ref Range Status   Specimen Description ABDOMEN  Final   Special Requests PERITONEAL FLUID  Final   Gram Stain   Final    WBC PRESENT,BOTH PMN AND MONONUCLEAR NO ORGANISMS SEEN CYTOSPIN SMEAR Performed at [facility], [address], [place], [place] [address]    Report Status 05/03/2020 FINAL  Final         Radiology Studies: IR Paracentesis  Result Date: 05/03/2020 INDICATION: Patient with acute decompensated alcoholic liver cirrhosis, hepatic renal syndrome, ascites. Request IR for diagnostic and therapeutic paracentesis with 3-4 L maximum. EXAM: ULTRASOUND GUIDED DIAGNOSTIC AND THERAPEUTIC PARACENTESIS MEDICATIONS: 10 mL 1% lidocaine COMPLICATIONS: None immediate. PROCEDURE: Informed written consent was obtained from the patient after a discussion of the risks, benefits and alternatives to treatment. A timeout was performed prior to the initiation of the procedure. Initial ultrasound scanning demonstrates a large amount of ascites within the right lower abdominal quadrant. The right lower abdomen was prepped and draped in the usual sterile fashion. 1% lidocaine was used for local anesthesia. Following this, a 6 Fr Safe-T-Centesis catheter was introduced. An ultrasound image was saved for documentation purposes. The paracentesis was performed. The catheter was removed and a dressing was applied. The patient tolerated the procedure well without immediate post procedural complication. Patient received post-procedure intravenous albumin; see nursing notes for details. FINDINGS: A total of approximately 3.0 L of bright yellow fluid was removed. Samples were sent to the laboratory as requested by the clinical team. IMPRESSION: Successful ultrasound-guided paracentesis yielding 3.0 liters of peritoneal fluid. Read by Lynnette Caffey, PA-C Electronically Signed   By: Richarda Overlie M.D.   On: 05/03/2020 10:46        Scheduled Meds: . (feeding supplement) PROSource Plus  30  mL Oral BID BM  . sodium chloride   Intravenous Once  . Chlorhexidine Gluconate Cloth  6 each Topical Q0600  . feeding supplement (ENSURE ENLIVE)  237 mL Oral TID BM  . lactulose  300 mL Rectal BID  . mouth rinse  15 mL Mouth Rinse BID  . midodrine  15 mg Oral TID  WC  . octreotide  150 mcg Subcutaneous BID  . pantoprazole (PROTONIX) IV  40 mg Intravenous Q12H  . phytonadione  10 mg Oral Once  . prednisoLONE  30 mg Oral Q breakfast  . rifaximin  550 mg Oral BID  . sodium chloride flush  10-40 mL Intracatheter Q12H  . thiamine  100 mg Oral Daily   Continuous Infusions: . sodium chloride Stopped (04/30/20 1614)  . cefTRIAXone (ROCEPHIN)  IV Stopped (05/03/20 1432)  . chlorproMAZINE (THORAZINE) IV       LOS: 7 days    Time spent: 35 minutes.     Alba Cory, MD [facility]   If 7PM-7AM, please contact night-coverage [email or web address]  05/04/2020, 9:35 AM

## 2020-05-04 NOTE — Progress Notes (Signed)
[facility] Progress Note  Alan Becker 44 y.o. 11/01/1975  CC: Alcoholic hepatitis   Subjective: Patient seen and examined at bedside.  He is alert but not giving any answers.  Discussed with RN.  Had 2 bowel movements overnight without any evidence of blood in the stool.  Having some incontinence of stool.  ROS : Not able to obtain   Objective: Vital signs in last 24 hours: Vitals:   05/03/20 2029 05/04/20 0426  BP: 111/71 (!) 107/56  Pulse: 72 82  Resp: 19 20  Temp: 98.4 F (36.9 C) 98.1 F (36.7 C)  SpO2: 90% 93%    Physical Exam:  General.  Alert but not able to comprehend questions Scleral icterus noted Abdomen is moderately distended, bowel sounds are present.  Abdomen is nontender.  No peritoneal signs Lower extremity.  trace edema Neuro.  Confused Psych.-Confused  Lab Results: Recent Labs    05/03/20 0413 05/04/20 0406  NA 128* 132*  K 3.8 3.6  CL 98 99  CO2 20* 21*  GLUCOSE 126* 121*  BUN 58* 66*  CREATININE 3.80* 3.43*  CALCIUM 6.3* 6.8*  PHOS 3.0 3.3   Recent Labs    05/03/20 0413 05/04/20 0406  AST 150* 151*  ALT 65* 66*  ALKPHOS 137* 142*  BILITOT 28.4* 31.3*  PROT 5.4* 5.7*  ALBUMIN 2.5* 2.4*   Recent Labs    05/03/20 0413 05/03/20 0413 05/03/20 2016 05/04/20 0406  WBC 27.9*  --   --  29.4*  HGB 9.2*   < > 9.5* 10.1*  HCT 25.4*   < > 25.9* 27.8*  MCV 101.2*  --   --  101.8*  PLT 85*  --   --  81*   < > = values in this interval not displayed.   Recent Labs    05/03/20 2016 05/04/20 0406  LABPROT 24.1* 23.7*  INR 2.3* 2.2*      Assessment/Plan: -Severe alcoholic hepatitis complicated by acute kidney injury and ascites.  Status post paracentesis.  Negative for SBP.  On prednisolone  -Blood in the stool.  Discussed with RN.  No evidence of current active bleeding.  Hemoglobin relatively stable. -Possible hepatorenal syndrome.  Management per nephrology. -Leukocytosis.  Could be from use of steroids.  On  antibiotics -Encephalopathy  Recommendations ------------------------- -Change lactulose to as needed because of fecal incontinence. -Start rifaximin -DC as needed Colace -No evidence of active bleeding at this time.  Hemoglobin stable. -Continue other supportive care. -Prognosis remains poor.  GI will follow periodically  Kathi Der MD, FACP 05/04/2020, 9:26 AM  Contact #  [phone]

## 2020-05-04 NOTE — Progress Notes (Signed)
Called and spoke with nurse. Patient is currently transferring to 4N. Instructed nurse to have 4N place new consult if PIV needed at that time. VU. Tomasita Morrow, RN VAST

## 2020-05-04 NOTE — Progress Notes (Signed)
Cortrak Team Brief Note  Cortrak team received page to place bridle on pt in radiology. NG tube located in R nare. RD secured tube in place with bridle.   Eugene Gavia, MS, RD, LDN RD pager number and weekend/on-call pager number located in [place].

## 2020-05-04 NOTE — Progress Notes (Signed)
Pharmacy contacted about crushing Rifaximin as pt is unable to take anything by mouth, but has a cortrak. Tablet has a coating that can not be crushed. There is no alternate form available, lactulose recommended for substitute. Pt is currently on lactulose. Will notify MD.

## 2020-05-04 NOTE — Plan of Care (Signed)
  Problem: Activity: Goal: Activity intolerance will improve Outcome: Progressing   

## 2020-05-04 NOTE — Progress Notes (Signed)
Patient is confused and not responding appropriately to questions asked. RN unable to give liquid medications as patient refuses to open his mouth and spits out medications given. Vital signs checked and stable at this time Placed on 02 per nasal cannula @ 2lL with O2 sat at 97%.

## 2020-05-04 NOTE — Progress Notes (Signed)
Nutrition Follow-up  DOCUMENTATION CODES:   Not applicable  INTERVENTION:  Via NGT: -Begin Vital 1.5 cal @ 29ml/hr, advance 52ml/hr Q4H until goal rate of 27ml/hr ( ) is reached -46ml Prosource TF BID -Free water per MD/CCM  At goal, tube feeding will provide 2060 kcals, 111 grams of protein, free water   D/c Prosource plus po BID D/c Ensure Enlive po  NUTRITION DIAGNOSIS:   Inadequate oral intake related to poor appetite as evidenced by per patient/family report.  Ongoing.  GOAL:   Patient will meet greater than or equal to 90% of their needs  Will address with tube feeding.   MONITOR:   PO intake, Labs, I & O's, Weight trends, Supplement acceptance  REASON FOR ASSESSMENT:   Consult Enteral/tube feeding initiation and management  ASSESSMENT:   Pt admitted for acute decompensated alcoholic cirrhosis causing AKI, most likely hepatorenal syndrome. PMH includes EtOH abuse, depression/anxiety, suicidal behavior, and HTN.  7/8 diagnostic paracentesis, negative for spontaneous bacterial peritonitis 7/9 CRRT initiated  7/11 CRRT stopped 7/14 paracentesis, yield 3L  Pt noted to have developed worsening encephalopathy today. Per RN, pt began refusing to open his mouth and was spitting out medications. CCM consulted for further evaluation and concern for decompensation. NGT placed in IR (postpyloric) and secured with bridle by Cortrak team. Tube tip is at the LOT (confirmed via xray).   Per MD, pt with grim prognosis. Palliative Care consult pending for GOC discussion.   Pt with no documented PO intake since 7/13.   UOP: x24 hours and 1x unmeasured occurrence  I/O: +4,302.52ml since admit  Labs: Na 132 (L), Corrected Ca 8.08 (L) Medications: 22ml Prosource Plus po BID, Ensure Enlive TID, Chronulac, Protonix, Prednisolone, Thiamine  Diet Order:   Diet Order            Diet renal with fluid restriction Fluid restriction: 1200 mL Fluid; Room service  appropriate? Yes; Fluid consistency: Thin  Diet effective now                 EDUCATION NEEDS:   Education needs have been addressed  Skin:  Skin Assessment: Reviewed RN Assessment  Last BM:  7/15 type 7  Height:   Ht Readings from Last 1 Encounters:  04/30/20 5\' 8"  (1.727 m)    Weight:   Wt Readings from Last 1 Encounters:  05/03/20 77.6 kg    BMI:  Body mass index is 26.01 kg/m.  Estimated Nutritional Needs:   Kcal:  1950-2150  Protein:  100-115 grams  Fluid:  >1.9L/d    05/05/20, MS, RD, LDN RD pager number and weekend/on-call pager number located in [place].

## 2020-05-04 NOTE — Progress Notes (Signed)
[facility] Progress Note  Subjective:  Unable to converse this AM.   I/Os yesterday 750 / 600 + 3L LVP  Vitals:   05/03/20 1009 05/03/20 1646 05/03/20 2029 05/04/20 0426  BP: 114/69 112/71 111/71 (!) 107/56  Pulse:  77 72 82  Resp:  18 19 20   Temp:  98.2 F (36.8 C) 98.4 F (36.9 C) 98.1 F (36.7 C)  TempSrc:  Oral  Oral  SpO2:  98% 90% 93%  Weight:   77.6 kg   Height:        Exam: Gen: awake but not answering questions except to say "yes" ENT: MMM CV: RRR Lungs: clear ant Abd: soft, less distended than yesterday but still somewhat Extr: trace leg edema Neuro: moving extremities and will briefly look at me but otherwise not attentive and very slow to answer questions, clonus at ankles, can't participate in asterixis exam  Assessment/Plan: 1.  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 Other possibility is bile- induced tubular injury/ ATN. Urine lytes c/w HRS, FeNa < 1%. Also decreased oral intake, NSAIDs and recent ARB use could have contributed. +moderate amount of ascites and if therapeutic paracentesis is desired would remove max 4 L max with concomitant administration of intravenous albumin 25%  - weaned off levo 7/10; Sp CRRT 7/9- 7/11  - Being treated with midodrine, octreotide, albumin for HRS --> responding, UOP improved and creatinine slightly improved from 4 to 3.8 to 3.4.   - Stopped albumin today post 48h  - hopefully kidney function can recover but still to be determined; no HD today, cont daily assessment.  Maintain catheter at the moment.  2.  Acute decompensated alcoholic hepatitis:He has been seen by Dr. [id number] of gastroenterology and his note reviewed; currently not a candidate for liver transplantation.  Supportive care.  On steroids.  3.  Acute metabolic encephalopathy: Likely secondary to hepatic encephalopathy based on elevated ammonia level with possibly some impact from azotemia.  On lactulose. Had resolved but now  altered this AM.  He doesn't appear to have missed lactulose.  D/w hospitalist who is going to eval now.  4.  Hypotension: Discontinued ARB/oral BP meds , getting midodrine, sp pressors. MAPs have been in 70s.  Bosie Clos MD [facility] Pager [phone]   Recent Labs  Lab 05/03/20 0413 05/03/20 0413 05/03/20 2016 05/04/20 0406  K 3.8  --   --  3.6  BUN 58*  --   --  66*  CREATININE 3.80*  --   --  3.43*  CALCIUM 6.3*  --   --  6.8*  PHOS 3.0  --   --  3.3  HGB 9.2*   < > 9.5* 10.1*   < > = values in this interval not displayed.   Inpatient medications:  (feeding supplement) PROSource Plus  30 mL Oral BID BM   sodium chloride   Intravenous Once   Chlorhexidine Gluconate Cloth  6 each Topical Q0600   feeding supplement (ENSURE ENLIVE)  237 mL Oral TID BM   lactulose  30 g Oral TID   mouth rinse  15 mL Mouth Rinse BID   midodrine  15 mg Oral TID WC   octreotide  150 mcg Subcutaneous BID   pantoprazole (PROTONIX) IV  40 mg Intravenous Q12H   phytonadione  10 mg Oral Once   prednisoLONE  30 mg Oral Q breakfast   sodium chloride flush  10-40 mL Intracatheter Q12H   thiamine  100 mg Oral Daily    sodium chloride Stopped (04/30/20 1614)   cefTRIAXone (ROCEPHIN)  IV Stopped (05/03/20 1432)   chlorproMAZINE (THORAZINE) IV     alum & mag hydroxide-simeth, docusate sodium, ondansetron (ZOFRAN) IV, oxyCODONE, polyethylene glycol, prochlorperazine, sodium chloride flush

## 2020-05-04 NOTE — Progress Notes (Addendum)
Occupational Therapy Evaluation Patient Details Name: Alan Becker MRN: [id number] DOB: 05-04-1976 Today's Date: 05/04/2020    History of Present Illness 44 year old with past medical history significant for alcohol abuse who was brought to the ED by family who noticed that he has been very confused and having skin color changes.Work-up in the ED consistent with acute renal failure creatinine of 8.5, albumin 1.9, bilirubin of 23, AST elevated and white count of 15.  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  On 7/9 he was a started on CRRT, GI renal following, underwent paracentesis on 7/8 with 30 cc of dark yellow fluid which was negative for SBP.  His care was transferred to [facility] service on 7/13.  He underwent repeated paracentesis on 7/14 results pending    Clinical Impression   PTA patient living in apartment independently.  Patient's mother stating his cognition was normal until Sunday.  Today patient not responding to questions and unable to follow simple commands.  He required max assist with bed mobility.  Currently needing total assist with ADLs due to cognition.  Recommending SNF at this time due to very decreased activity tolerance, but pending progress may consider CIR as patient was independent prior and has good support from family.  Will continue to follow with OT acutely to address the deficits listed below.      Follow Up Recommendations  SNF;Supervision/Assistance - 24 hour (pending progress may recommend CIR if activity tolerance inc)    Equipment Recommendations  Other (comment) (defer to next venue)    Recommendations for Other Services       Precautions / Restrictions Precautions Precautions: Fall Restrictions Weight Bearing Restrictions: No      Mobility Bed Mobility Overal bed mobility: Needs Assistance Bed Mobility: Supine to Sit;Sit to Supine     Supine to sit: Max assist;HOB elevated Sit to supine: Max  assist;HOB elevated   General bed mobility comments: Patient needing assist with moving legs over and initiating pulling trunk up.  Tremors once sitting up.  Transfers Overall transfer level: Needs assistance               General transfer comment: unable to attempt     Balance Overall balance assessment: Needs assistance Sitting-balance support: Bilateral upper extremity supported;Feet supported Sitting balance-Leahy Scale: Poor                                     ADL either performed or assessed with clinical judgement   ADL Overall ADL's : Needs assistance/impaired                                     Functional mobility during ADLs: Maximal assistance (bed mobility) General ADL Comments: Patient is total assist with all ADLs at this time due to cognition.  Unable to follow commands or keep attention.       Vision   Additional Comments: Not making eye contact.  Eyes sometimes with nystagmus     Perception     Praxis      Pertinent Vitals/Pain Pain Assessment: Faces Faces Pain Scale: Hurts a little bit Pain Location: general Pain Descriptors / Indicators: Grimacing Pain Intervention(s): Limited activity within patient's tolerance;Monitored during session     Hand Dominance Right   Extremity/Trunk Assessment Upper Extremity Assessment Upper Extremity  Assessment: Generalized weakness   Lower Extremity Assessment Lower Extremity Assessment: Defer to PT evaluation       Communication Communication Communication: Expressive difficulties   Cognition Arousal/Alertness: Lethargic Behavior During Therapy: Flat affect Overall Cognitive Status: Impaired/Different from baseline Area of Impairment: Orientation;Attention;Memory;Following commands;Safety/judgement;Awareness;Problem solving                 Orientation Level: Disoriented to;Place;Time;Situation Current Attention Level: Focused Memory: Decreased short-term  memory Following Commands: Follows one step commands inconsistently Safety/Judgement: Decreased awareness of safety;Decreased awareness of deficits Awareness: Intellectual Problem Solving: Slow processing;Decreased initiation;Difficulty sequencing;Requires verbal cues;Requires tactile cues General Comments: Patient minimally responsive.  Not able to follow simple commands, but when therapist inititated sitting up task he was able to continue.  Responsing yes or no to 2-3 questions.  Not making eye contact and staring off.     General Comments  Skin/eyes very yellow in color, jaundice. On 2L O2.  Mom in room during session    Exercises     Shoulder Instructions      Home Living Family/patient expects to be discharged to:: Private residence Living Arrangements: Alone Available Help at Discharge: Family Type of Home: Apartment Home Access: Level entry     Home Layout: One level     Bathroom Shower/Tub: Tub/shower unit             Additional Comments: Patient may live with mom when leaving      Prior Functioning/Environment Level of Independence: Independent        Comments: Mother providing home environment and prior level info        OT Problem List: Decreased strength;Decreased activity tolerance;Impaired balance (sitting and/or standing);Decreased coordination;Decreased cognition;Decreased safety awareness;Decreased knowledge of use of DME or AE      OT Treatment/Interventions: Self-care/ADL training;Therapeutic exercise;Energy conservation;DME and/or AE instruction;Therapeutic activities;Cognitive remediation/compensation;Patient/family education;Balance training    OT Goals(Current goals can be found in the care plan section) Acute Rehab OT Goals Patient Stated Goal: unable to state OT Goal Formulation: With family Time For Goal Achievement: 05/18/20 Potential to Achieve Goals: Fair ADL Goals Pt Will Perform Grooming: with min assist;sitting Pt Will Perform  Upper Body Dressing: with min assist;sitting Additional ADL Goal #1: Patient will respond to simple commands 3/5 times with tactile cueing. Additional ADL Goal #2: Patient will attend to 1 min task with min verbal cueing.  OT Frequency: Min 2X/week   Barriers to D/C:    Needing more assistance at this time than mother can provide       Co-evaluation              AM-PAC OT "6 Clicks" Daily Activity     Outcome Measure Help from another person eating meals?: Total Help from another person taking care of personal grooming?: Total Help from another person toileting, which includes using toliet, bedpan, or urinal?: Total Help from another person bathing (including washing, rinsing, drying)?: Total Help from another person to put on and taking off regular upper body clothing?: Total Help from another person to put on and taking off regular lower body clothing?: Total 6 Click Score: 6   End of Session Equipment Utilized During Treatment: Oxygen Nurse Communication: Mobility status  Activity Tolerance: Patient limited by fatigue;Patient limited by lethargy Patient left: in bed;with call bell/phone within reach;with bed alarm set  OT Visit Diagnosis: Unsteadiness on feet (R26.81);Muscle weakness (generalized) (M62.81);Other symptoms and signs involving cognitive function  Time: 1130-1146 OT Time Calculation (min): 16 min Charges:  OT General Charges $OT Visit: 1 Visit OT Evaluation $OT Eval Moderate Complexity: 1 [address] OTR/L   Adella Hare 05/04/2020, 12:11 PM

## 2020-05-04 NOTE — Progress Notes (Signed)
Called mother to discuss worsening liver function and that patient may be approaching end of life.  We discussed what to do if he takes a terminal decline, do we put him on life support, do chest compressions and shocks or allow him to pass in peace.  She stated that I am not allowed to call her and ask questions like this and that I will be reported.  She then hung up on me.  Will ask palliative's help to continue this discussion.  I will not be reaching out to the mother again at her request.  Myrla Halsted MD PCCM

## 2020-05-04 NOTE — Progress Notes (Signed)
NAME:  Alan Becker, MRN:  [id number], DOB:  [birth year], LOS: 7 ADMISSION DATE:  04/27/2020, CONSULTATION DATE:  7/8 REFERRING MD:  Tegeler, CHIEF COMPLAINT:  confusion   Brief History   44 y/o male who has been drinking heavily for several years presented to the [facility] ER complaining of confusion, weakness, change in skin color.  Noted to have evidence of liver and kidney failure, PCCM consulted for admission.  7/15 PCCM Consulted due to progressive encephalopathy and liver failure    History of present illness   44 y/o male presented to the [facility] ED after his family noted him to be very confused and have skin color changes.  He has a history of depression and has been drinking heavily for the last 8 years since his divorce.  His mother gives most of the history because the patient is confused.  She says that he has been experiencing abdominal bloating/pain, fatigue and malaise for the last 3-4 weeks.  He passed some blood in his stool yesterday, none today.  No recent vomiting.  He took 800mg  ibuprofen yesterday to help with the abdominal pain.  Because of his confusion and skin changes his mother thought it would be best for him to be admitted to the hospital for further evaluation.  He has not had anything to drink in the last 3-4 weeks.  He vomited some initially after he quit drinking, but not much since then.  Appetite poor.  No fevers or chills.    Past Medical History  Alcoholism with heavy alcohol abuse Essential Hypertension  Significant Hospital Events   7/8 admission 7/9 started CRRT  Consults:  PCCM Nephrology  Procedures:  7/8 diagnostic paracentesis> 30 cc of dark yellow fluid aspirated 7/8 CT Head nl  7/8 Ct abd pelvis: Hepatomegaly, parenchymal changes, and moderate ascites.  Focal hypoattenuating mass within segment 4 a of the liver is not well characterized on this noncontrast examination and could be better assessed with dedicated liver protocol CT or MRI examination once  the patient's acute issues have resolved. Pathologic periportal adenopathy is nonspecific and may be simply reactive given the pathologic process within the liver.  Significant Diagnostic Tests:  IR Para 7/14   Micro Data:  7/8 peritoneal fluid> no organism so far  Antimicrobials:  ceftriaxone 7/8 -7/10  Interim history/subjective:    Objective   Blood pressure (!) 107/56, pulse 82, temperature 98.1 F (36.7 C), temperature source Oral, resp. rate 20, height 5\' 8"  (1.727 m), weight 77.6 kg, SpO2 93 %.        Intake/Output Summary (Last 24 hours) at 05/04/2020 0949 Last data filed at 05/04/2020 0600 Gross per 24 hour  Intake 748.82 ml  Output 3600 ml  Net -2851.18 ml   Filed Weights   05/01/20 0337 05/02/20 2046 05/03/20 2029  Weight: 77.6 kg 77.6 kg 77.6 kg    Examination: General: NAD, appears older than state age HEENT:  jaundice sclera  Cardiovascular: RRR, no MRG Pulmonary : No distress noted. Clear breath sounds  Abdominal: active bowel sounds, mildly distended Musculoskeletal: -edema  Skin: Warm, dry , jaundiced  Neuro: alert, answers simple questions, does not follow commands   Resolved Hospital Problem list   Ascites/abdominal pain: Concern for spontaneous bacterial peritonitis Diagnostic paracentesis on 7/08 performed in the emergency room,-no organism, nucleated cells less than 250, ceftriaxone discontinue  Assessment & Plan:   Hepatic encephalopathy Plan as below  Acute decompensated alcoholic cirrhosis causing acute kidney injury, most likely hepatorenal syndrome S/p 3  days of octreotide infusion Plan: Nephrology following  Hold narcotics  Alcoholic hepatitis - discriminant function 89 on admit. 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  Hematochezia 7/7 Transfuse PRBC for Hgb < 7 gm/dL Plan Trend CBC Transfuse for hemoglobin <7  Best practice:  Diet: renal diet DVT prophylaxis: SCD Glucose control:monitor glucose with labs Mobility: bed rest Code Status: full Family Communication: Mother updated. See Dr. Katrinka Blazing noted   Patient to transfer to progressive care. Palliative Care Consulted   Jovita Kussmaul, AGACNP-BC [facility]  Pgr: [phone]  PCCM Pgr: [phone]

## 2020-05-04 NOTE — Plan of Care (Signed)
  Problem: Clinical Measurements: Goal: Respiratory complications will improve Outcome: Progressing   Problem: Nutrition: Goal: Adequate nutrition will be maintained Outcome: Progressing   Problem: Coping: Goal: Level of anxiety will decrease Outcome: Progressing   Problem: Safety: Goal: Ability to remain free from injury will improve Outcome: Progressing   

## 2020-05-05 DIAGNOSIS — Z66 Do not resuscitate: Secondary | ICD-10-CM | POA: Diagnosis not present

## 2020-05-05 DIAGNOSIS — K7200 Acute and subacute hepatic failure without coma: Secondary | ICD-10-CM | POA: Diagnosis present

## 2020-05-05 DIAGNOSIS — G9340 Encephalopathy, unspecified: Secondary | ICD-10-CM | POA: Diagnosis present

## 2020-05-05 DIAGNOSIS — Z515 Encounter for palliative care: Secondary | ICD-10-CM

## 2020-05-05 LAB — GLUCOSE, CAPILLARY
Glucose-Capillary: 148 mg/dL — ABNORMAL HIGH (ref 70–99)
Glucose-Capillary: 134 mg/dL — ABNORMAL HIGH (ref 70–99)
Glucose-Capillary: 138 mg/dL — ABNORMAL HIGH (ref 70–99)
Glucose-Capillary: 140 mg/dL — ABNORMAL HIGH (ref 70–99)
Glucose-Capillary: 138 mg/dL — ABNORMAL HIGH (ref 70–99)

## 2020-05-05 LAB — COMPREHENSIVE METABOLIC PANEL
ALT: 79 U/L — ABNORMAL HIGH (ref 0–44)
AST: 165 U/L — ABNORMAL HIGH (ref 15–41)
Albumin: 2.3 g/dL — ABNORMAL LOW (ref 3.5–5.0)
Alkaline Phosphatase: 153 U/L — ABNORMAL HIGH (ref 38–126)
Anion gap: 10 (ref 5–15)
BUN: 76 mg/dL — ABNORMAL HIGH (ref 6–20)
CO2: 19 mmol/L — ABNORMAL LOW (ref 22–32)
Calcium: 7 mg/dL — ABNORMAL LOW (ref 8.9–10.3)
Chloride: 106 mmol/L (ref 98–111)
Creatinine, Ser: 3 mg/dL — ABNORMAL HIGH (ref 0.61–1.24)
GFR calc Af Amer: 28 mL/min — ABNORMAL LOW (ref >60)
GFR calc non Af Amer: 24 mL/min — ABNORMAL LOW (ref >60)
Glucose, Bld: 138 mg/dL — ABNORMAL HIGH (ref 70–99)
Potassium: 3.1 mmol/L — ABNORMAL LOW (ref 3.5–5.1)
Sodium: 135 mmol/L (ref 135–145)
Total Bilirubin: 34.5 mg/dL (ref 0.3–1.2)
Total Protein: 5.8 g/dL — ABNORMAL LOW (ref 6.5–8.1)

## 2020-05-05 LAB — CBC
HCT: 30.3 % — ABNORMAL LOW (ref 39.0–52.0)
Hemoglobin: 10.9 g/dL — ABNORMAL LOW (ref 13.0–17.0)
MCH: 37.2 pg — ABNORMAL HIGH (ref 26.0–34.0)
MCHC: 36 g/dL (ref 30.0–36.0)
MCV: 103.4 fL — ABNORMAL HIGH (ref 80.0–100.0)
Platelets: 81 10*3/uL — ABNORMAL LOW (ref 150–400)
RBC: 2.93 MIL/uL — ABNORMAL LOW (ref 4.22–5.81)
RDW: 21.9 % — ABNORMAL HIGH (ref 11.5–15.5)
WBC: 33.1 10*3/uL — ABNORMAL HIGH (ref 4.0–10.5)
nRBC: 0.1 % (ref 0.0–0.2)

## 2020-05-05 LAB — PHOSPHORUS: Phosphorus: 4.4 mg/dL (ref 2.5–4.6)

## 2020-05-05 LAB — URINE CULTURE: Culture: NO GROWTH

## 2020-05-05 LAB — PATHOLOGIST SMEAR REVIEW

## 2020-05-05 LAB — MAGNESIUM: Magnesium: 2.4 mg/dL (ref 1.7–2.4)

## 2020-05-05 LAB — AMMONIA: Ammonia: 77 umol/L — ABNORMAL HIGH (ref 9–35)

## 2020-05-05 MED ORDER — PENTOXIFYLLINE ER 400 MG PO TBCR
400.0000 mg | EXTENDED_RELEASE_TABLET | Freq: Every day | ORAL | Status: DC
  Administered 2020-05-05 – 2020-05-07 (×3): 400 mg via ORAL
  Filled 2020-05-05 (×3): qty 1

## 2020-05-05 MED ORDER — POTASSIUM CHLORIDE 10 MEQ/100ML IV SOLN
10.0000 meq | INTRAVENOUS | Status: AC
  Administered 2020-05-05 (×2): 10 meq via INTRAVENOUS
  Filled 2020-05-05: qty 100

## 2020-05-05 MED ORDER — LACTULOSE ENEMA
300.0000 mL | Freq: Two times a day (BID) | ORAL | Status: DC
  Administered 2020-05-05 – 2020-05-06 (×3): 300 mL via RECTAL
  Filled 2020-05-05 (×4): qty 300

## 2020-05-05 NOTE — Progress Notes (Signed)
PROGRESS NOTE    Alan Becker  ZOX:[id number] DOB: 1976/07/19 DOA: 04/27/2020 PCP: Alan Grandchild, MD   Brief Narrative: 44 year old with past medical history significant for alcohol abuse who was brought to the ED by family who noticed that he has been very confused and having skin color changes.  He has a history of depression and has been drinking heavily for the last 8 years since his divorce.  His mother gave most of the history because, patient has been confused.  Mother reporter that patient has been experiencing abdominal bloating and pain, fatigue and malaise for the last 3 to 4 weeks.  Work-up in the ED consistent with acute renal failure creatinine of 8.5, albumin 1.9, bilirubin of 23, AST elevated and white count of 15.  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  On 7/9 he was a started on CRRT, GI renal following, underwent paracentesis on 7/8 with 30 cc of dark yellow fluid which was negative for SBP.  His care was transferred to [facility] service on 7/13.  He underwent repeated paracentesis on 7/14 yielding 3 L of fluid.   Patient develops worsening encephalopathy on 7/15, not following command, more lethargic. CCM consulted for further evaluation and concern for decompensation. Plan to transfer to progressive care unit, support care, NG tube placement for medications.     Assessment & Plan:   Active Problems:   Hepatorenal syndrome (HCC)   AKI (acute kidney injury) (HCC)   Encounter for central line placement  1-Acute renal failure, hepatorenal syndrome: Nephrology consulted and following. Patient was a started on midodrine/octreotide/albumin No acute indication for dialysis, plan to monitor renal function and urine output for the next several days. Urine out put yesterday 776. Cr trending down, today at 3.  Follow nephrology recommendation in regards to HD   Acute decompensated alcoholic liver cirrhosis -[facility] consulted.   Dr. Bosie Clos saw the patient earlier during this admission. -Patient was a started on prednisone, his discriminate function was 60 -He was deemed not a candidate for liver transplant given recent alcohol use -Symptomatic ascites, patient underwent repeat paracentesis on 7/14 follow fluid results: culture no growth to date. Pathology report pending, WBC 196. On empiric antibiotics.  -Added ceftriaxone for SBP prophylaxis due to concern for GI bleed -Worsening mental status, ammonium up, plan for rifaximin and lactulose pre NG tube.  -CCM consulted. Transfer to progressive care.  -he is more alert this am. Still very confuse. Continue with lactulose.   Acute hypoxic, respiratory failure; increase oxygen supply.  Chest x ray, Hypoinflation.   Respiratory alkalosis; support care   Leukocytosis: Follow trend could be related to prednisone. On IV ceftriaxone to cover for infectious process.  Blood cultures ordered.   Hyponatremia; might be related to renal failure, liver failure.   Concern for GI bleed: Mother noticed blood in the toilet, continue to monitor for any evidence of GI bleed.  Monitor hemoglobin. Started on IV Protonix. GI following.  Hb has remain stable   Long-term alcohol abuse: Counseling provided Continue with thiamine.  Abnormal liver lesion: CT abdomen on 7/8 noted questionable focal hypoattenuation mass in the liver not well characterized, recommend a dedicated liver protocol CT or MRI when more stable  Major depression: Not appropriate for psychotropic med in the setting of decompensated cirrhosis  Hypokalemia; replete IV>   Nutrition Problem: Inadequate oral intake Etiology: poor appetite    Signs/Symptoms: per patient/family report    Interventions: Ensure Enlive (each  supplement provides 350kcal and 20 grams of protein), Education, Prostat  Estimated body mass index is 27.96 kg/m as calculated from the following:   Height as of this encounter:   (1.727 m).   Weight as of this encounter: 83.4 kg.   DVT prophylaxis: SCDs, hold heparin Code Status: Full code Family Communication: plan for palliative care consulted. Patient with poor prognosis.  Disposition Plan:  Status is: Inpatient  Remains inpatient appropriate because:IV treatments appropriate due to intensity of illness or inability to take PO   Dispo: The patient is from: Home              Anticipated d/c is to: To be determined              Anticipated d/c date is: 3 days              Patient currently is not medically stable to d/c.        Consultants:   GI, [facility]  CCM  Procedures:     Antimicrobials:    Subjective: Patient appears to be more alert this morning.  He denies pain.  He is still confuse.   Objective: Vitals:   05/04/20 2021 05/04/20 2306 05/05/20 0338 05/05/20 0500  BP:  111/65 106/67   Pulse:  81 77   Resp:  (!) 26 (!) 26   Temp: 100.2 F (37.9 C) 99.9 F (37.7 C) 98.5 F (36.9 C)   TempSrc: Axillary Axillary Axillary   SpO2:  94% 97%   Weight:    83.4 kg  Height:        Intake/Output Summary (Last 24 hours) at 05/05/2020 0749 Last data filed at 05/05/2020 0600 Gross per 24 hour  Intake 100 ml  Output 776 ml  Net -676 ml   Filed Weights   05/02/20 2046 05/03/20 2029 05/05/20 0500  Weight: 77.6 kg 77.6 kg 83.4 kg    Examination:  General exam: Chronic ill appearing. Icteric.  Respiratory system: Normal respiratory effort, no crackles.  Cardiovascular system: S 1, S 2 RRR Gastrointestinal system: BS present, soft,  nt Central nervous system:less lethargic, confuse Extremities: no edema   Data Reviewed: I have personally reviewed following labs and imaging studies  CBC: Recent Labs  Lab 05/02/20 0426 05/03/20 0413 05/03/20 2016 05/04/20 0406 05/05/20 0553  WBC 28.3* 27.9*  --  29.4* 33.1*  HGB 10.3* 9.2* 9.5* 10.1* 10.9*  HCT 28.6* 25.4* 25.9* 27.8* 30.3*  MCV 102.1* 101.2*  --  101.8* 103.4*    PLT 132* 85*  --  81* 81*   Basic Metabolic Panel: Recent Labs  Lab 04/29/20 0451 04/29/20 1600 04/30/20 0500 04/30/20 0500 05/02/20 0426 05/03/20 0413 05/04/20 0406 05/04/20 1650 05/05/20 0553  NA 133*   < > 137  --  131* 128* 132*  --  135  K 3.8   < > 4.0  --  4.6 3.8 3.6  --  3.1*  CL 98   < > 101  --  98 98 99  --  106  CO2 23   < > 25  --  22 20* 21*  --  19*  GLUCOSE 117*   < > 96  --  100* 126* 121*  --  138*  BUN 36*   < > 20  --  43* 58* 66*  --  76*  CREATININE 4.41*   < > 1.84*  --  4.06* 3.80* 3.43*  --  3.00*  CALCIUM 7.0*   < >  7.3*  --  6.3* 6.3* 6.8*  --  7.0*  MG 2.0  --  2.3  --   --   --   --  2.4 2.4  PHOS 4.7*   < > 2.8   < > 3.3 3.0 3.3 3.7 4.4   < > = values in this interval not displayed.   GFR: Estimated Creatinine Clearance: 33.1 mL/min (A) (by C-G formula based on SCr of 3 mg/dL (H)). Liver Function Tests: Recent Labs  Lab 04/30/20 0500 05/02/20 0426 05/03/20 0413 05/04/20 0406 05/05/20 0553  AST  --  213* 150* 151* 165*  ALT  --  72* 65* 66* 79*  ALKPHOS  --  178* 137* 142* 153*  BILITOT  --  27.5* 28.4* 31.3* 34.5*  PROT  --  5.3* 5.4* 5.7* 5.8*  ALBUMIN 2.2* 2.1* 2.5* 2.4* 2.3*   No results for input(s): LIPASE, AMYLASE in the last 168 hours. Recent Labs  Lab 05/03/20 2016 05/05/20 0553  AMMONIA 111* 77*   Coagulation Profile: Recent Labs  Lab 05/03/20 2016 05/04/20 0406  INR 2.3* 2.2*   Cardiac Enzymes: No results for input(s): CKTOTAL, CKMB, CKMBINDEX, TROPONINI in the last 168 hours. BNP (last 3 results) No results for input(s): PROBNP in the last 8760 hours. HbA1C: No results for input(s): HGBA1C in the last 72 hours. CBG: Recent Labs  Lab 04/30/20 2031 05/04/20 1713 05/04/20 2015 05/04/20 2304 05/05/20 0401  GLUCAP 81 125* 136* 137* 148*   Lipid Profile: No results for input(s): CHOL, HDL, LDLCALC, TRIG, CHOLHDL, LDLDIRECT in the last 72 hours. Thyroid Function Tests: No results for input(s): TSH,  T4TOTAL, FREET4, T3FREE, THYROIDAB in the last 72 hours. Anemia Panel: No results for input(s): VITAMINB12, FOLATE, FERRITIN, TIBC, IRON, RETICCTPCT in the last 72 hours. Sepsis Labs: No results for input(s): PROCALCITON, LATICACIDVEN in the last 168 hours.  Recent Results (from the past 240 hour(s))  Culture, blood (routine x 2)     Status: None   Collection Time: 04/27/20  1:57 PM   Specimen: BLOOD  Result Value Ref Range Status   Specimen Description   Final    BLOOD RIGHT ANTECUBITAL Performed at [facility], [address] [place], [place] [address]    Special Requests   Final    BOTTLES DRAWN AEROBIC AND ANAEROBIC Blood Culture results may not be optimal due to an inadequate volume of blood received in culture bottles Performed at [facility], [address], [place], [place] [address]    Culture   Final    NO GROWTH 5 DAYS Performed at [facility], [address], [place], [place] [address]    Report Status 05/02/2020 FINAL  Final  SARS Coronavirus 2 by RT PCR (hospital order, performed in [facility] hospital lab) Nasopharyngeal Nasopharyngeal Swab     Status: None   Collection Time: 04/27/20  1:57 PM   Specimen: Nasopharyngeal Swab  Result Value Ref Range Status   SARS Coronavirus 2 NEGATIVE NEGATIVE Final    Comment: (NOTE) SARS-CoV-2 target nucleic acids are NOT DETECTED.  The SARS-CoV-2 RNA is generally detectable in upper and lower respiratory specimens during the acute phase of infection. The lowest concentration of SARS-CoV-2 viral copies this assay can detect is 250 copies / mL. A negative result does not preclude SARS-CoV-2 infection and should not be used as the sole basis for treatment or other patient management decisions.  A negative result may occur with improper specimen collection /  handling, submission of specimen other than nasopharyngeal swab, presence of viral mutation(s) within the areas targeted by  this assay, and inadequate number of viral copies (<250 copies / mL). A negative result must be combined with clinical observations, patient history, and epidemiological information.  Fact Sheet for Patients:   BoilerBrush.com.cyhttps://www.fda.gov/media/136312/download  Fact Sheet for Healthcare Providers: https://pope.com/https://www.fda.gov/media/136313/download  This test is not yet approved or  cleared by the [place] FDA and has been authorized for detection and/or diagnosis of SARS-CoV-2 by FDA under an Emergency Use Authorization (EUA).  This EUA will remain in effect (meaning this test can be used) for the duration of the COVID-19 declaration under Section 564(b)(1) of the Act, 21 U.S.C. section 360bbb-3(b)(1), unless the authorization is terminated or revoked sooner.  Performed at [facility], [address] [place], [place] [address]   Culture, blood (routine x 2)     Status: None   Collection Time: 04/27/20  2:37 PM   Specimen: BLOOD LEFT HAND  Result Value Ref Range Status   Specimen Description   Final    BLOOD LEFT HAND Performed at [facility], [address] [place], [place] [address]    Special Requests   Final    BOTTLES DRAWN AEROBIC AND ANAEROBIC Blood Culture results may not be optimal due to an inadequate volume of blood received in culture bottles Performed at [facility], [address] [place], [place] [address]    Culture   Final    NO GROWTH 5 DAYS Performed at [facility], [address] [place], [place] [address]    Report Status 05/02/2020 FINAL  Final  Body fluid culture (includes gram stain)     Status: None   Collection Time: 04/27/20  5:26 PM   Specimen: Peritoneal Washings; Pleural Fluid  Result Value Ref Range Status   Specimen Description   Final    PERITONEAL FLUID Performed at [facility], [address] [place], [place] [address]    Special Requests   Final    NONE Performed at  [facility], [address] [place], [place] [address]    Gram Stain   Final    WBC PRESENT, PREDOMINANTLY MONONUCLEAR NO ORGANISMS SEEN CYTOSPIN SMEAR    Culture   Final    NO GROWTH 3 DAYS Performed at [facility], [address] [place], [place] [address]    Report Status 05/01/2020 FINAL  Final  MRSA PCR Screening     Status: None   Collection Time: 04/27/20  8:44 PM   Specimen: Nasopharyngeal  Result Value Ref Range Status   MRSA by PCR NEGATIVE NEGATIVE Final    Comment:        The GeneXpert MRSA Assay (FDA approved for NASAL specimens only), is one component of a comprehensive MRSA colonization surveillance program. It is not intended to diagnose MRSA infection nor to guide or monitor treatment for MRSA infections. Performed at [facility], [address] [place], [place] [address]   MRSA PCR Screening     Status: None   Collection Time: 04/30/20  8:35 PM   Specimen: Nasopharyngeal  Result Value Ref Range Status   MRSA by PCR NEGATIVE NEGATIVE Final    Comment:        The GeneXpert MRSA Assay (FDA approved for NASAL specimens only), is one component of a comprehensive MRSA colonization surveillance program. It is not intended to diagnose MRSA infection  nor to guide or monitor treatment for MRSA infections. Performed at [facility], [address] [place], [place] [address]   Gram stain     Status: None   Collection Time: 05/03/20  1:10 PM   Specimen: Abdomen; Peritoneal Fluid  Result Value Ref Range Status   Specimen Description ABDOMEN  Final   Special Requests PERITONEAL FLUID  Final   Gram Stain   Final    WBC PRESENT,BOTH PMN AND MONONUCLEAR NO ORGANISMS SEEN CYTOSPIN SMEAR Performed at [facility], [address], [place], [place] [address]    Report Status 05/03/2020 FINAL  Final  Culture, body fluid-bottle     Status: None (Preliminary result)   Collection Time: 05/03/20  1:10 PM    Specimen: Peritoneal Washings  Result Value Ref Range Status   Specimen Description PERITONEAL FLUID  Final   Special Requests ABDOMEN  Final   Culture   Final    NO GROWTH < 24 HOURS Performed at [facility], [address] [place], [place] [address]    Report Status PENDING  Incomplete         Radiology Studies: DG Chest 2 View  Result Date: 05/04/2020 CLINICAL DATA:  Cough. EXAM: CHEST - 2 VIEW COMPARISON:  April 28, 2020. FINDINGS: The heart size and mediastinal contours are within normal limits. Feeding tube is seen entering stomach. Right internal jugular catheter is unchanged. No pneumothorax is noted. Hypoinflation of the lungs is noted with mild bibasilar subsegmental atelectasis. No significant pleural effusion is noted. The visualized skeletal structures are unremarkable. IMPRESSION: Hypoinflation of the lungs with mild bibasilar subsegmental atelectasis. Electronically Signed   By: Lupita Raider M.D.   On: 05/04/2020 13:58   DG Abd 1 View  Result Date: 05/04/2020 CLINICAL DATA:  Feeding tube placement EXAM: ABDOMEN - 1 VIEW COMPARISON:  None. FINDINGS: Feeding tube placed in the Radiology department. Documenting film shows soft feeding tube tip at the ligament of Treitz. Contrast injected for confirmation. IMPRESSION: Soft feeding tube tip at the ligament of Treitz. Electronically Signed   By: Paulina Fusi M.D.   On: 05/04/2020 13:59   US Abdomen Limited RUQ  Result Date: 05/04/2020 CLINICAL DATA:  Abnormal transaminase levels. EXAM: ULTRASOUND ABDOMEN LIMITED RIGHT UPPER QUADRANT COMPARISON:  April 27, 2020. FINDINGS: Gallbladder: No gallstones or wall thickening visualized. No sonographic Murphy sign noted by sonographer. Small amount of sludge is noted within the gallbladder lumen. Common bile duct: Diameter: 3 mm which is within normal limits. Liver: No definite focal lesion identified. Increased echogenicity of hepatic parenchyma is noted suggesting hepatic  steatosis. Portal vein is patent on color Doppler imaging with normal direction of blood flow towards the liver. Other: Minimal ascites is noted. IMPRESSION: Small amount of gallbladder sludge. Increased echogenicity of hepatic parenchyma is noted most consistent with hepatic steatosis. Minimal ascites. Electronically Signed   By: Lupita Raider M.D.   On: 05/04/2020 10:57   IR Paracentesis  Result Date: 05/03/2020 INDICATION: Patient with acute decompensated alcoholic liver cirrhosis, hepatic renal syndrome, ascites. Request IR for diagnostic and therapeutic paracentesis with 3-4 L maximum. EXAM: ULTRASOUND GUIDED DIAGNOSTIC AND THERAPEUTIC PARACENTESIS MEDICATIONS: 10 mL 1% lidocaine COMPLICATIONS: None immediate. PROCEDURE: Informed written consent was obtained from the patient after a discussion of the risks, benefits and alternatives to treatment. A timeout was performed prior to the initiation of the procedure. Initial ultrasound scanning demonstrates a large amount of ascites within the right lower abdominal quadrant.  The right lower abdomen was prepped and draped in the usual sterile fashion. 1% lidocaine was used for local anesthesia. Following this, a 6 Fr Safe-T-Centesis catheter was introduced. An ultrasound image was saved for documentation purposes. The paracentesis was performed. The catheter was removed and a dressing was applied. The patient tolerated the procedure well without immediate post procedural complication. Patient received post-procedure intravenous albumin; see nursing notes for details. FINDINGS: A total of approximately 3.0 L of bright yellow fluid was removed. Samples were sent to the laboratory as requested by the clinical team. IMPRESSION: Successful ultrasound-guided paracentesis yielding 3.0 liters of peritoneal fluid. Read by Lynnette Caffey, PA-C Electronically Signed   By: Richarda Overlie M.D.   On: 05/03/2020 10:46        Scheduled Meds:  Chlorhexidine Gluconate  Cloth  6 each Topical Q0600   feeding supplement (PROSource TF)  45 mL Per Tube BID   lactulose  20 g Oral TID   mouth rinse  15 mL Mouth Rinse BID   midodrine  15 mg Oral TID WC   pantoprazole (PROTONIX) IV  40 mg Intravenous Q12H   prednisoLONE  30 mg Oral Q breakfast   rifaximin  550 mg Oral BID   sodium chloride flush  10-40 mL Intracatheter Q12H   thiamine  100 mg Oral Daily   Continuous Infusions:  sodium chloride Stopped (04/30/20 1614)   cefTRIAXone (ROCEPHIN)  IV Stopped (05/04/20 1432)   feeding supplement (VITAL 1.5 CAL) 45 mL/hr at 05/05/20 0714   potassium chloride       LOS: 8 days    Time spent: 35 minutes.     Alba Cory, MD [facility]   If 7PM-7AM, please contact night-coverage [email or web address]  05/05/2020, 7:49 AM

## 2020-05-05 NOTE — Evaluation (Signed)
Clinical/Bedside Swallow Evaluation Patient Details  Name: Alan Becker MRN: [id number] Date of Birth: 1975-12-10  Today's Date: 05/05/2020 Time: SLP Start Time (ACUTE ONLY): 1400 SLP Stop Time (ACUTE ONLY): 1445 SLP Time Calculation (min) (ACUTE ONLY): 45 min  Past Medical History:  Past Medical History:  Diagnosis Date  . Depression   . Hypertension   . Suicidal behavior    Past Surgical History:  Past Surgical History:  Procedure Laterality Date  . IR PARACENTESIS  05/03/2020   HPI:  44yo male admitted 04/27/20 with abdominal pain, feeling sick for 2-3 weeks. PMH: alcohol abuse (at least a fifth of gin daily for several years), depression, HTN, suicidal behavior   Assessment / Plan / Recommendation Clinical Impression  Pt was seen at bedside for assessment of swallow function and safety, and to identify least restrictive diet. Pt has recently started refusing all po and medications, and has had cortrak placed. Pt was sleeping upon arrival of SLP, but awakened easily. Moderate encouragement was required for pt to cooperate. Minimal tolerance of oral care - pt would not open his mouth. Pt accpeted trials of thin liquid, puree, and solid textures. No overt s/s aspiration observed on any consistency tested , despite multiple presentations. RN provided po meds whole with liquid and with puree. Pt was encouraged to swallow the pill whole, but he chewed both of them. Recommend continuing with current diet. Full supervision for feeding until pt is able to demonstrate the ability to feed himself. SLP will follow briefly for assessment of diet tolerance and to continue education.   SLP Visit Diagnosis: Dysphagia, unspecified (R13.10)    Aspiration Risk  Mild aspiration risk;Risk for inadequate nutrition/hydration    Diet Recommendation Thin liquid;Regular   Liquid Administration via: Straw Medication Administration: Whole meds with puree Supervision: Full supervision/cueing for compensatory  strategies Compensations: Slow rate;Small sips/bites Postural Changes: Seated upright at 90 degrees    Other  Recommendations Oral Care Recommendations: Oral care BID Other Recommendations: Have oral suction available   Follow up Recommendations 24 hour supervision/assistance      Frequency and Duration min 1 x/week  1 week;2 weeks       Prognosis Prognosis for Safe Diet Advancement: Good Barriers to Reach Goals: Cognitive deficits      Swallow Study   General Date of Onset: 04/27/20 HPI: 44yo male admitted 04/27/20 with abdominal pain, feeling sick for 2-3 weeks. PMH: alcohol abuse (at least a fifth of gin daily for several years), depression, HTN, suicidal behavior Type of Study: Bedside Swallow Evaluation Previous Swallow Assessment: none Diet Prior to this Study: Regular;Thin liquids (renal) Temperature Spikes Noted: No Respiratory Status: Nasal cannula History of Recent Intubation: No Behavior/Cognition: Alert;Requires cueing;Doesn't follow directions Oral Cavity Assessment: Other (comment) (pt would not open his mouth) Oral Care Completed by SLP: Yes (pt minimally cooperative with oral care) Self-Feeding Abilities: Total assist Patient Positioning: Upright in bed Baseline Vocal Quality: Normal Volitional Cough: Cognitively unable to elicit Volitional Swallow: Unable to elicit    Oral/Motor/Sensory Function Overall Oral Motor/Sensory Function: Within functional limits   Ice Chips Ice chips: Not tested   Thin Liquid Thin Liquid: Within functional limits Presentation: Straw    Nectar Thick Nectar Thick Liquid: Not tested   Honey Thick Honey Thick Liquid: Not tested   Puree Puree: Within functional limits Presentation: Spoon   Solid     Solid: Within functional limits     Tatum Corl B. Murvin Natal, [facility], CCC-SLP Speech Language Pathologist Office: [phone] Pager: [phone]  [place],  Ruffin Pyo 05/05/2020,2:56 PM

## 2020-05-05 NOTE — Evaluation (Signed)
Physical Therapy Evaluation Patient Details Name: Alan Becker MRN: [id number] DOB: 01-13-1976 Today's Date: 05/05/2020   History of Present Illness  44 year old with past medical history significant for alcohol abuse who was brought to the ED by family who noticed that he has been very confused and having skin color changes.Work-up in the ED consistent with acute renal failure.  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  On 7/9 he was a started on CRRT, GI renal following, underwent paracentesis on 7/8 with 30 cc of dark yellow fluid which was negative for SBP.  His care was transferred to [facility] service on 7/13.  He underwent repeated paracentesis on 7/14 results pending.  Clinical Impression  Pt presents to PT with deficits in cognition, endurance, strength, power, functional mobility, awareness, balance, gait. Pt fatigues rapidly during session, tolerating sitting at edge of bed for less than 5 minutes before impulsively laying back down in bed. Pt inconsistently follows verbal command but demonstrates improved following with the addition of visual and tactile cueing. Pt is generally weak and requries physical assistance to mobilize as well as clsoe guard at edge of bed to maintain safety. Pt will continue to benefit from acute PT POC to improve activity tolerance and to reduce falls risk. PT currently recommends SNF placement due to high falls risk and mobility deficits, if this aligns with the patient and family goals of care.    Follow Up Recommendations SNF;Supervision/Assistance - 24 hour (pending pt and family goals of care)    Equipment Recommendations  Wheelchair (measurements PT);Wheelchair cushion (measurements PT);Hospital bed (mechanical lift)    Recommendations for Other Services       Precautions / Restrictions Precautions Precautions: Fall Restrictions Weight Bearing Restrictions: No      Mobility  Bed Mobility Overal bed  mobility: Needs Assistance Bed Mobility: Supine to Sit;Sit to Supine     Supine to sit: Mod assist Sit to supine: Mod assist   General bed mobility comments: pt impulsively begins to return to supine, reporting fatigue  Transfers                    Ambulation/Gait                Stairs            Wheelchair Mobility    Modified Rankin (Stroke Patients Only)       Balance Overall balance assessment: Needs assistance Sitting-balance support: Bilateral upper extremity supported;Feet supported Sitting balance-Leahy Scale: Poor Sitting balance - Comments: reliant on BUE support and minG                                     Pertinent Vitals/Pain Pain Assessment: Faces Faces Pain Scale: Hurts little more Pain Location: general Pain Descriptors / Indicators: Grimacing Pain Intervention(s): Monitored during session    Home Living Family/patient expects to be discharged to:: Private residence Living Arrangements: Alone Available Help at Discharge: Family Type of Home: Apartment Home Access: Level entry     Home Layout: One level   Additional Comments: Patient may live with mom when leaving    Prior Function Level of Independence: Independent         Comments: history obtained from chart review of OT itnercation with pt's mother as pt is an unreliable historian     Hand Dominance   Dominant Hand: Right  Extremity/Trunk Assessment   Upper Extremity Assessment Upper Extremity Assessment: Generalized weakness    Lower Extremity Assessment Lower Extremity Assessment: Generalized weakness    Cervical / Trunk Assessment Cervical / Trunk Assessment: Other exceptions (distended abdomen)  Communication   Communication: Expressive difficulties  Cognition Arousal/Alertness: Awake/alert;Lethargic Behavior During Therapy: Impulsive Overall Cognitive Status: Impaired/Different from baseline Area of Impairment:  Orientation;Attention;Memory;Following commands;Safety/judgement;Problem solving;Awareness                 Orientation Level: Disoriented to;Place;Time;Situation Current Attention Level: Focused Memory: Decreased short-term memory Following Commands: Follows one step commands with increased time Safety/Judgement: Decreased awareness of safety;Decreased awareness of deficits Awareness: Intellectual Problem Solving: Slow processing;Decreased initiation;Difficulty sequencing;Requires verbal cues;Requires tactile cues General Comments: pt follows one step commands with increased time and multi-modal cues      General Comments General comments (skin integrity, edema, etc.): pt noted to demonstrate increased work of breathing with resting RR in low 30s and RR with mobility in mid to high 30s, otherwise VSS on 3L Speed    Exercises     Assessment/Plan    PT Assessment Patient needs continued PT services  PT Problem List Decreased strength;Decreased activity tolerance;Decreased balance;Decreased mobility;Decreased cognition;Decreased knowledge of use of DME;Decreased safety awareness;Decreased knowledge of precautions;Cardiopulmonary status limiting activity       PT Treatment Interventions DME instruction;Gait training;Functional mobility training;Therapeutic activities;Therapeutic exercise;Balance training;Neuromuscular re-education;Cognitive remediation;Patient/family education;Wheelchair mobility training    PT Goals (Current goals can be found in the Care Plan section)  Acute Rehab PT Goals Patient Stated Goal: pt unable to state, PT goal to improve activity tolerance PT Goal Formulation: With patient Time For Goal Achievement: 05/19/20 Potential to Achieve Goals: Poor    Frequency Min 2X/week   Barriers to discharge        Co-evaluation               AM-PAC PT "6 Clicks" Mobility  Outcome Measure Help needed turning from your back to your side while in a flat bed  without using bedrails?: A Lot Help needed moving from lying on your back to sitting on the side of a flat bed without using bedrails?: A Lot Help needed moving to and from a bed to a chair (including a wheelchair)?: Total Help needed standing up from a chair using your arms (e.g., wheelchair or bedside chair)?: Total Help needed to walk in hospital room?: Total Help needed climbing 3-5 steps with a railing? : Total 6 Click Score: 8    End of Session Equipment Utilized During Treatment: Oxygen Activity Tolerance: Patient limited by fatigue Patient left: in bed;with call bell/phone within reach;with bed alarm set Nurse Communication: Mobility status PT Visit Diagnosis: Other abnormalities of gait and mobility (R26.89);Muscle weakness (generalized) (M62.81)    Time: 4888-9169 PT Time Calculation (min) (ACUTE ONLY): 13 min   Charges:   PT Evaluation $PT Eval Moderate Complexity: 1 Mod        Arlyss Gandy, PT, DPT Acute Rehabilitation Pager: [phone]   Arlyss Gandy 05/05/2020, 5:11 PM

## 2020-05-05 NOTE — Progress Notes (Signed)
[facility] Progress Note  Primo Innis 44 y.o. April 09, 1976  CC: Alcoholic hepatitis   Subjective: Patient seen and examined at bedside.  He remains confused.  Refusing to take his p.o. meds requiring use of NG tube placement.  He has been transferred to progressive unit.  ROS : Not able to obtain   Objective: Vital signs in last 24 hours: Vitals:   05/05/20 0338 05/05/20 0805  BP: 106/67 113/74  Pulse: 77 76  Resp: (!) 26 (!) 22  Temp: 98.5 F (36.9 C) 97.7 F (36.5 C)  SpO2: 97% 96%    Physical Exam:  General.  Confused.  Not cooperative. Scleral icterus with deep jaundice noted Abdomen is moderately distended, bowel sounds are present.  Abdomen is nontender.  No peritoneal signs Lower extremity.  no edema Neuro.  Confused Psych.-Confused  Lab Results: Recent Labs    05/04/20 0406 05/04/20 0406 05/04/20 1650 05/05/20 0553  NA 132*  --   --  135  K 3.6  --   --  3.1*  CL 99  --   --  106  CO2 21*  --   --  19*  GLUCOSE 121*  --   --  138*  BUN 66*  --   --  76*  CREATININE 3.43*  --   --  3.00*  CALCIUM 6.8*  --   --  7.0*  MG  --   --  2.4 2.4  PHOS 3.3   < > 3.7 4.4   < > = values in this interval not displayed.   Recent Labs    05/04/20 0406 05/05/20 0553  AST 151* 165*  ALT 66* 79*  ALKPHOS 142* 153*  BILITOT 31.3* 34.5*  PROT 5.7* 5.8*  ALBUMIN 2.4* 2.3*   Recent Labs    05/04/20 0406 05/05/20 0553  WBC 29.4* 33.1*  HGB 10.1* 10.9*  HCT 27.8* 30.3*  MCV 101.8* 103.4*  PLT 81* 81*   Recent Labs    05/03/20 2016 05/04/20 0406  LABPROT 24.1* 23.7*  INR 2.3* 2.2*      Assessment/Plan: -Severe alcoholic hepatitis complicated by acute kidney injury and ascites.  Status post paracentesis.  Negative for SBP.  On prednisolone  -Encephalopathy -Blood in the stool.   No evidence of current active bleeding.  Hemoglobin relatively stable. -Possible hepatorenal syndrome.  Management per nephrology. -Leukocytosis.  Could be  from use of steroids.  On antibiotics   Recommendations ------------------------- -Unfortunately, not able to use rifaximin through NG tube. -Continue lactulose via NG tube.  Add lactulose enema. -Add renally adjusted dose of pentoxifylline 400 mg once a day  -Continue other supportive care. -Prognosis remains poor.    -I had long discussion with patient's mother over the phone yesterday and face-to-face conversation today.  Poor prognosis because of worsening liver test discussed.  We will try to maximize medication to help out with encephalopathy.  Adding Trental to prednisolone.   She had questions about dialysis which I asked her to discuss with nephrologist.  I did inform her that his kidney functions have improved but not normalized.   Kathi Der MD, FACP 05/05/2020, 10:21 AM  Contact #  [phone]

## 2020-05-05 NOTE — Progress Notes (Signed)
[facility] Progress Note  Subjective:  Unable to converse this AM.   I/Os yesterday 0.8L UOP SCR improved / dec to 3.0  Vitals:   05/05/20 0338 05/05/20 0500 05/05/20 0805 05/05/20 1208  BP: 106/67  113/74 105/65  Pulse: 77  76 78  Resp: (!) 26  (!) 22 17  Temp: 98.5 F (36.9 C)  97.7 F (36.5 C) 98.5 F (36.9 C)  TempSrc: Axillary  Axillary Axillary  SpO2: 97%  96% 96%  Weight:  83.4 kg    Height:        Exam: Gen: getting lactulose enema, awake, confused ENT: MMM CV: RRR Lungs: clear ant Abd: soft, mild distension Extr: trace leg edema Neuro: awake, confused  Assessment/Plan: 1.  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 Other possibility is bile- induced tubular injury/ ATN. Urine lytes c/w HRS, FeNa < 1%. Also decreased oral intake, NSAIDs and recent ARB use could have contributed.  - weaned off levo 7/10; Sp CRRT 7/9- 7/11  - Being treated with midodrine, octreotide, albumin for HRS --> responding, UOP improved and creatinine slightly improved from 4 to 3.8 to 3.4.   - nonoliguric, SCr downtrending, no HD required, suggestive of recovery.   - cont supportive care at this time  2.  Acute decompensated alcoholic hepatitis:He has been seen by Dr. Bosie Clos of gastroenterology and his note reviewed; currently not a candidate for liver transplantation.  Supportive care.  On steroids. Pentoxifylllne  3.  Hepatic encephalopathy:  On lactulose. Per GI and TRH  4.  Hypotension: Cont midodrine  Arita Miss  [facility]   Recent Labs  Lab 05/04/20 0406 05/04/20 0406 05/04/20 1650 05/05/20 0553  K 3.6  --   --  3.1*  BUN 66*  --   --  76*  CREATININE 3.43*  --   --  3.00*  CALCIUM 6.8*  --   --  7.0*  PHOS 3.3   < > 3.7 4.4  HGB 10.1*  --   --  10.9*   < > = values in this interval not displayed.   Inpatient medications: . Chlorhexidine Gluconate Cloth  6 each Topical Q0600  . feeding supplement (PROSource TF)  45 mL Per Tube BID  .  lactulose  20 g Oral TID  . lactulose  300 mL Rectal BID  . mouth rinse  15 mL Mouth Rinse BID  . midodrine  15 mg Oral TID WC  . pantoprazole (PROTONIX) IV  40 mg Intravenous Q12H  . pentoxifylline  400 mg Oral Daily  . prednisoLONE  30 mg Oral Q breakfast  . rifaximin  550 mg Oral BID  . sodium chloride flush  10-40 mL Intracatheter Q12H  . thiamine  100 mg Oral Daily   . sodium chloride Stopped (04/30/20 1614)  . cefTRIAXone (ROCEPHIN)  IV Stopped (05/04/20 1432)  . feeding supplement (VITAL 1.5 CAL) 45 mL/hr at 05/05/20 0714   albuterol, alum & mag hydroxide-simeth, ondansetron (ZOFRAN) IV, polyethylene glycol, prochlorperazine, sodium chloride flush

## 2020-05-05 NOTE — TOC Progression Note (Signed)
Transition of Care [facility]) - Progression Note    Patient Details  Name: Alan Becker MRN: [id number] Date of Birth: 20-Apr-1976  [facility] CM/SW Contact  Lockie Pares, RN Phone Number: 05/05/2020, 11:37 AM  Clinical Narrative:    Patient admitted, on neuroprogressive unit at present. Patient extremely icteric, in hepatorenal failure. From report mother refuses to consider change in status. Patient remains a full code. May need to look at ethics considerations/consult  as progress indicates, as well as palliative consult. At this time cannot perform self care, speaking in one word or two, confused.  Medicaid currently pending.  May need to look into hospice care or placement depending on outcomes/treatments. . Prognosis is poor at this time.    Expected Discharge Plan: Skilled Nursing Facility Barriers to Discharge: Continued Medical Work up  Expected Discharge Plan and Services Expected Discharge Plan: Skilled Nursing Facility In-house Referral: Clinical Social Work Discharge Planning Services: CM Consult   Living arrangements for the past 2 months: Apartment                                       Social Determinants of Health (SDOH) Interventions    Readmission Risk Interventions No flowsheet data found.

## 2020-05-05 NOTE — Consult Note (Signed)
Consultation Note Date: 05/05/2020   Patient Name: Alan Becker  DOB: [birth year]  MRN: [id number]  Age / Sex: 44 y.o., male  PCP: Janith Lima, MD Referring Physician: Elmarie Shiley, MD  Reason for Consultation: Establishing goals of care  HPI/Patient Profile: 44 y.o. male  with past medical history of alcohol abuse, severe depression, HTN who was admitted on 04/27/2020 with confusion, weakness and changes in skin color. Per his mother the patient was a heavy drinker for the past 8 years but had stopped drinking 3 - 4 weeks ago.  Per admission H&P:  Work-up in the ED consistent with acute renal failure creatinine of 8.5, albumin 1.9, bilirubin of 23, AST elevated and white count of 15.  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  On 7/9 he was a started on CRRT, GI renal following, underwent paracentesis on 7/8 with 30 cc of dark yellow fluid which was negative for SBP.  He underwent repeated paracentesis on 7/14 yielding 3 L of fluid.  Patient developed worsening encephalopathy on 7/15, not following commands, more lethargic. CCM consulted for further evaluation and concern for decompensation. Plan to transfer to progressive care unit, support care, NG tube placement for medications.   Clinical Assessment and Goals of Care:  I have reviewed medical records including EPIC notes, labs and imaging, received report from Dr. Tyrell Antonio, examined the patient and met at bedside with his mother Alan Becker  to discuss diagnosis prognosis, [place], EOL wishes, disposition and options.  I introduced Palliative Medicine as specialized medical care for people living with serious illness. It focuses on providing relief from the symptoms and stress of a serious illness.   We discussed a brief life review of the patient. 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  He also has a  beautiful daughter named Dispensing optician.  He is currently divorced.  He is [place].  He has many friends who are very supportive of him.  He has battled severe depression and addiction for many years.  His mother mentions that he began drinking in his teens.  She feels that has severe depression likely stems from his father leaving the family when Lyle was a young child.  As far as functional and nutritional status - Stirling is encephalopathic - unable to stand or walk, and not eating.  He is refusing medications.  His situation is very worrisome because even though he has been able to come off pressors and CRRT his encephalopathy is worsening despite treatment.    We discussed his current illness and what it means in the larger context of his on-going co-morbidities.  Natural disease trajectory and expectations at EOL were discussed.  Alan Becker has had too many close experiences with death.  She has been thru the death of both of her parents at a very young age.  She understands that Alfonzia is critically ill and there is a significant possibility that he will not survive to be discharged from the hospital.  She is realistic and trying to accept God's will.  Alan Becker is deeply spiritual.  Alan Becker expresses sincere appreciation of the care that Dr. Tyrell Antonio has provided to both she and Rhet.    We discussed Kamsiyochukwu' fragile state.  Alan Becker understand that coding Edyn would not bring him back but rather only hasten his death.  She is agreeable to instituting the protective measure of DNR.   If [place] arrests Alan Becker does not want [place] to perform ACLS measures.  I expressed that we want Izic to improve and are doing everything we can at this point to make that happen.  We talked about what happens if Kelijah continues to worsen.  We briefly discussed comfort measures.  Questions and concerns were addressed.  The family was encouraged to call with questions or concerns.   I will follow up with Avier and the family  when I return to work on Sunday.      Primary Decision Maker:  NEXT OF KIN Mother, Alan Becker    SUMMARY OF RECOMMENDATIONS    Code status changed to DNR. Continue full scope treatment. PMT will follow with you.  Code Status/Advance Care Planning:  DNR   Symptom Management:   Per primary  Additional Recommendations (Limitations, Scope, Preferences):  Full Scope Treatment  Palliative Prophylaxis:   Delirium Protocol  Psycho-social/Spiritual:   Desire for further Chaplaincy support: requested.  Prognosis: very concerning.  Possibly weeks to months.    Discharge Planning: To Be Determined      Primary Diagnoses: Present on Admission: . Hepatorenal syndrome ([place])   I have reviewed the medical record, interviewed the patient and family, and examined the patient. The following aspects are pertinent.  Past Medical History:  Diagnosis Date  . Depression   . Hypertension   . Suicidal behavior    Social History   Socioeconomic History  . Marital status: Divorced    Spouse name: Not on file  . Number of children: Not on file  . Years of education: Not on file  . Highest education level: Not on file  Occupational History  . Not on file  Tobacco Use  . Smoking status: Current Every Day Smoker    Packs/day: 1.00    Years: 20.00    Pack years: 20.00    Types: Cigarettes    Start date: 04/22/1995  . Smokeless tobacco: Never Used  Substance and Sexual Activity  . Alcohol use: Yes    Alcohol/week: 5.0 standard drinks    Types: 5 Glasses of wine per week  . Drug use: No  . Sexual activity: Yes  Other Topics Concern  . Not on file  Social History Narrative  . Not on file   Social Determinants of Health   Financial Resource Strain:   . Difficulty of Paying Living Expenses:   Food Insecurity:   . Worried About Charity fundraiser in the Last Year:   . Arboriculturist in the Last Year:   Transportation Needs:   . Film/video editor (Medical):   Marland Kitchen  Lack of Transportation (Non-Medical):   Physical Activity:   . Days of Exercise per Week:   . Minutes of Exercise per Session:   Stress:   . Feeling of Stress :   Social Connections:   . Frequency of Communication with Friends and Family:   . Frequency of Social Gatherings with Friends and Family:   . Attends Religious Services:   . Active Member of Clubs or Organizations:   .  Attends Archivist Meetings:   Marland Kitchen Marital Status:    Family History  Problem Relation Age of Onset  . Depression Mother   . Arthritis Mother   . Cancer Neg Hx   . Diabetes Neg Hx   . Drug abuse Neg Hx   . Hearing loss Neg Hx   . Early death Neg Hx   . Heart disease Neg Hx   . Hyperlipidemia Neg Hx   . Hypertension Neg Hx   . Kidney disease Neg Hx     Allergies  Allergen Reactions  . Penicillins Other (See Comments)    Did it involve swelling of the face/tongue/throat, SOB, or low BP? No Did it involve sudden or severe rash/hives, skin peeling, or any reaction on the inside of your mouth or nose? Rash Did you need to seek medical attention at a hospital or doctor's office? Yes When did it last happen?Child If all above answers are "NO", may proceed with cephalosporin use.       Physical Exam  Jaundice 2+, scleral icterus 3+ ,   Patient does not speak to me.  Cor trak in nose.  Abdomen soft, nt, nd  Vital Signs: BP 105/65 (BP Location: Right Arm)   Pulse 78   Temp 98.5 F (36.9 C) (Axillary)   Resp 17   Ht '5\' 8"'$  (1.727 m)   Wt 83.4 kg   SpO2 96%   BMI 27.96 kg/m  Pain Scale: PAINAD POSS *See Group Information*: S-Acceptable,Sleep, easy to arouse Pain Score: Asleep   SpO2: SpO2: 96 % O2 Device:SpO2: 96 % O2 Flow Rate: .O2 Flow Rate (L/min): 3 L/min    Palliative Assessment/Data: 20%     Time In: 10:00 Time Out: 11:00 Time Total: 60 min. Visit consisted of counseling and education dealing with the complex and emotionally intense issues surrounding the need for  palliative care and symptom management in the setting of serious and potentially life-threatening illness. Greater than 50%  of this time was spent counseling and coordinating care related to the above assessment and plan.  Signed by: Florentina Jenny, PA-C Palliative Medicine  Please contact Palliative Medicine Team phone at [phone] for questions and concerns.  For individual provider: See Shea Evans

## 2020-05-05 NOTE — Progress Notes (Signed)
Patient's family met with Palliative Care.  Transitioned to DNR.  PCCM will defer medical management to [facility] and be available as needed.     Noe Gens, MSN, NP-C [facility] 05/05/2020, 11:48 AM   Please see [email or web address] for pager details.

## 2020-05-06 LAB — CBC
HCT: 30.3 % — ABNORMAL LOW (ref 39.0–52.0)
Hemoglobin: 10.8 g/dL — ABNORMAL LOW (ref 13.0–17.0)
MCH: 37.2 pg — ABNORMAL HIGH (ref 26.0–34.0)
MCHC: 35.6 g/dL (ref 30.0–36.0)
MCV: 104.5 fL — ABNORMAL HIGH (ref 80.0–100.0)
Platelets: 88 10*3/uL — ABNORMAL LOW (ref 150–400)
RBC: 2.9 MIL/uL — ABNORMAL LOW (ref 4.22–5.81)
RDW: 22 % — ABNORMAL HIGH (ref 11.5–15.5)
WBC: 34.6 10*3/uL — ABNORMAL HIGH (ref 4.0–10.5)
nRBC: 0 % (ref 0.0–0.2)

## 2020-05-06 LAB — COMPREHENSIVE METABOLIC PANEL
ALT: 75 U/L — ABNORMAL HIGH (ref 0–44)
AST: 139 U/L — ABNORMAL HIGH (ref 15–41)
Albumin: 2.2 g/dL — ABNORMAL LOW (ref 3.5–5.0)
Alkaline Phosphatase: 168 U/L — ABNORMAL HIGH (ref 38–126)
Anion gap: 13 (ref 5–15)
BUN: 67 mg/dL — ABNORMAL HIGH (ref 6–20)
CO2: 19 mmol/L — ABNORMAL LOW (ref 22–32)
Calcium: 7.7 mg/dL — ABNORMAL LOW (ref 8.9–10.3)
Chloride: 110 mmol/L (ref 98–111)
Creatinine, Ser: 2.19 mg/dL — ABNORMAL HIGH (ref 0.61–1.24)
GFR calc Af Amer: 41 mL/min — ABNORMAL LOW (ref >60)
GFR calc non Af Amer: 35 mL/min — ABNORMAL LOW (ref >60)
Glucose, Bld: 138 mg/dL — ABNORMAL HIGH (ref 70–99)
Potassium: 2.8 mmol/L — ABNORMAL LOW (ref 3.5–5.1)
Sodium: 142 mmol/L (ref 135–145)
Total Bilirubin: 34.6 mg/dL (ref 0.3–1.2)
Total Protein: 6 g/dL — ABNORMAL LOW (ref 6.5–8.1)

## 2020-05-06 LAB — GLUCOSE, CAPILLARY
Glucose-Capillary: 132 mg/dL — ABNORMAL HIGH (ref 70–99)
Glucose-Capillary: 139 mg/dL — ABNORMAL HIGH (ref 70–99)
Glucose-Capillary: 141 mg/dL — ABNORMAL HIGH (ref 70–99)
Glucose-Capillary: 161 mg/dL — ABNORMAL HIGH (ref 70–99)
Glucose-Capillary: 168 mg/dL — ABNORMAL HIGH (ref 70–99)

## 2020-05-06 LAB — PHOSPHORUS: Phosphorus: 3.2 mg/dL (ref 2.5–4.6)

## 2020-05-06 LAB — MAGNESIUM: Magnesium: 2.3 mg/dL (ref 1.7–2.4)

## 2020-05-06 LAB — AMMONIA: Ammonia: 62 umol/L — ABNORMAL HIGH (ref 9–35)

## 2020-05-06 MED ORDER — HYDROMORPHONE HCL 1 MG/ML IJ SOLN
0.5000 mg | INTRAMUSCULAR | Status: DC | PRN
  Administered 2020-05-06 – 2020-05-11 (×5): 0.5 mg via INTRAVENOUS
  Filled 2020-05-06 (×6): qty 1

## 2020-05-06 MED ORDER — POTASSIUM CHLORIDE CRYS ER 20 MEQ PO TBCR
20.0000 meq | EXTENDED_RELEASE_TABLET | Freq: Once | ORAL | Status: AC
  Administered 2020-05-06: 20 meq via ORAL
  Filled 2020-05-06: qty 1

## 2020-05-06 MED ORDER — POTASSIUM CHLORIDE 10 MEQ/100ML IV SOLN
10.0000 meq | INTRAVENOUS | Status: AC
  Administered 2020-05-06 (×4): 10 meq via INTRAVENOUS
  Filled 2020-05-06 (×4): qty 100

## 2020-05-06 NOTE — Progress Notes (Signed)
[facility] Progress Note  Subjective:  Awake, groggy this AM; SCr improved to 2.2, K 2.8 repleted already  I/Os yesterday  UOP 2L!    Vitals:   05/06/20 0044 05/06/20 0421 05/06/20 0500 05/06/20 0816  BP: 116/73 117/84  119/76  Pulse: 89 93  94  Resp: (!) 28 (!) 35  (!) 27  Temp: 97.6 F (36.4 C) 98 F (36.7 C)  98.2 F (36.8 C)  TempSrc: Oral Oral  Oral  SpO2: 98% 97%  98%  Weight:   81.9 kg   Height:        Exam: Gen: NAD SKIN sig jaundice EYES icteric sclera ENT: MMM CV: RRR Lungs: clear ant Abd: soft, mild distension Extr: trace leg edema Neuro: awake, confused  Assessment/Plan: 1.  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 Other possibility is bile- induced tubular injury/ ATN. Urine lytes c/w HRS, FeNa < 1%. Also decreased oral intake, NSAIDs and recent ARB use could have contributed. - is recovering GFR, good UOP, downtrending SCr.    - weaned off levo 7/10; Sp CRRT 7/9- 7/11  - treated with midodrine, octreotide, albumin for HRS   - cont supportive care at this time - remove Temp HD cath today.  2.  Acute decompensated alcoholic hepatitis:He has been seen by Dr. Bosie Clos of gastroenterology and his note reviewed; currently not a candidate for liver transplantation.  Supportive care.  On steroids. Pentoxifylllne  3.  Hepatic encephalopathy:  On lactulose. Per GI and TRH  4.  Hypotension: Cont midodrine  Arita Miss  [facility]   Recent Labs  Lab 05/05/20 0553 05/06/20 0500  K 3.1* 2.8*  BUN 76* 67*  CREATININE 3.00* 2.19*  CALCIUM 7.0* 7.7*  PHOS 4.4 3.2  HGB 10.9* 10.8*   Inpatient medications: . Chlorhexidine Gluconate Cloth  6 each Topical Q0600  . feeding supplement (PROSource TF)  45 mL Per Tube BID  . lactulose  20 g Oral TID  . lactulose  300 mL Rectal BID  . mouth rinse  15 mL Mouth Rinse BID  . midodrine  15 mg Oral TID WC  . pantoprazole (PROTONIX) IV  40 mg Intravenous Q12H  . pentoxifylline  400 mg  Oral Daily  . prednisoLONE  30 mg Oral Q breakfast  . rifaximin  550 mg Oral BID  . sodium chloride flush  10-40 mL Intracatheter Q12H  . thiamine  100 mg Oral Daily   . sodium chloride Stopped (04/30/20 1614)  . cefTRIAXone (ROCEPHIN)  IV 2 g (05/05/20 1443)  . feeding supplement (VITAL 1.5 CAL) 1,000 mL (05/06/20 0300)  . potassium chloride     albuterol, alum & mag hydroxide-simeth, ondansetron (ZOFRAN) IV, polyethylene glycol, prochlorperazine, sodium chloride flush

## 2020-05-06 NOTE — Progress Notes (Signed)
Improvement in renal function noted, now off Levophed.  His bilirubin is now stable at 34.6 and appears to have "crested."  Transaminases also appear to have crested and are minimally improved today.  Dialysis catheter removed.  Despite that, his current meld score, even without taking into account dialysis in the past week, is 36 (estimated 24-month mortality of 53%); if his CRRT treatments about a week ago are taken into account, his estimated mortality is more like 70%.  Patient's mental status is substantially improved at this time.  Per the nurse, he is alert and oriented x3.  He just received some Dilaudid for abdominal pain, so at this time he is rather quiet but indicates that the pain medication did help his abdominal pain.  On exam, the patient is awake but essentially nonconversant, very deeply jaundiced, no distress, chest clear, heart slightly tachycardic, abdomen protuberant to about 5 or 6 months of pregnancy, with moderately tense ascites but no palpable hepatosplenomegaly.  No significant peripheral edema.  Impression: The patient seems to have turned the corner with respect to the current episode of alcoholic hepatitis, but is still extremely tenuous.  Plan: Continue current supportive care.  Resume oral intake.  Consider removal of feeding tube tomorrow.  Consider dose reduction of oral lactulose if the patient continues to have diarrhea.  Florencia Reasons, M.D. Pager [phone] If no answer or after 5 PM call [phone]

## 2020-05-06 NOTE — Progress Notes (Signed)
PROGRESS NOTE    Alan Becker  RJJ:[id number] DOB: 28-May-1976 DOA: 04/27/2020 PCP: [name], MD   Brief Narrative: 44 year old with past medical history significant for alcohol abuse who was brought to the ED by family who noticed that he has been very confused and having skin color changes.  He has a history of depression and has been drinking heavily for the last 8 years since his divorce.  His mother gave most of the history because, patient has been confused.  Mother reporter that patient has been experiencing abdominal bloating and pain, fatigue and malaise for the last 3 to 4 weeks.  Work-up in the ED consistent with acute renal failure creatinine of 8.5, albumin 1.9, bilirubin of 23, AST elevated and white count of 15.  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  On 7/9 he was a started on CRRT, GI renal following, underwent paracentesis on 7/8 with 30 cc of dark yellow fluid which was negative for SBP.  His care was transferred to [facility] service on 7/13.  He underwent repeated paracentesis on 7/14 yielding 3 L of fluid.   Patient develops worsening encephalopathy on 7/15, not following command, more lethargic. CCM consulted for further evaluation and concern for decompensation. Plan to transfer to progressive care unit, support care, NG tube placement for medications.     Assessment & Plan:   Active Problems:   Hepatorenal syndrome (HCC)   AKI (acute kidney injury) (HCC)   Palliative care encounter   Acute liver failure without hepatic coma   Encephalopathy acute   DNR (do not resuscitate)  1-Acute renal failure, hepatorenal syndrome: Nephrology consulted and following. Patient was a started on midodrine/octreotide/albumin No acute indication for dialysis, plan to monitor renal function and urine output for the next several days. Urine out put yesterday 776. Cr trending down, today at 2.1 Plan to remove HD catheter today.    Acute decompensated alcoholic liver cirrhosis -[facility] consulted.  Dr. Bosie Clos saw the patient earlier during this admission. -Patient was a started on prednisone, his discriminate function was 44 -He was deemed not a candidate for liver transplant given recent alcohol use -Symptomatic ascites, patient underwent repeat paracentesis on 7/14 follow fluid results: culture no growth to date. Pathology report pending, WBC 196. On empiric antibiotics.  -Added ceftriaxone for SBP prophylaxis due to concern for GI bleed -Worsening mental status, ammonium up, plan for rifaximin and lactulose pre NG tube.  -CCM consulted. Transfer to progressive care.  -he was alert, able to answer few questions today.  -he has been able to take rifaximin, discontinue lactulose enema.  -started on Pentoxifylline.   Acute hypoxic, respiratory failure; increase oxygen supply.  Chest x ray, Hypoinflation.   Respiratory alkalosis; support care   Leukocytosis: Follow trend could be related to prednisone. On IV ceftriaxone to cover for infectious process.  Blood cultures; pending.   Hyponatremia; might be related to renal failure, liver failure.   Concern for GI bleed: Mother noticed blood in the toilet, continue to monitor for any evidence of GI bleed.  Monitor hemoglobin. Started on IV Protonix. GI following.  Hb has remain stable   Long-term alcohol abuse: Counseling provided Continue with thiamine.  Abnormal liver lesion: CT abdomen on 7/8 noted questionable focal hypoattenuation mass in the liver not well characterized, recommend a dedicated liver protocol CT or MRI when more stable  Major depression: Not appropriate for psychotropic med in the setting of decompensated cirrhosis  Hypokalemia;  replete IV> and orally   Nutrition Problem: Inadequate oral intake Etiology: poor appetite    Signs/Symptoms: per patient/family report    Interventions: Ensure Enlive (each supplement provides 350kcal  and 20 grams of protein), Education, Prostat  Estimated body mass index is 27.45 kg/m as calculated from the following:   Height as of this encounter:  (1.727 m).   Weight as of this encounter: 81.9 kg.   DVT prophylaxis: SCDs, hold heparin Code Status: Full code Family Communication: plan for palliative care consulted. Patient with poor prognosis. Mother 7/16 Disposition Plan:  Status is: Inpatient  Remains inpatient appropriate because:IV treatments appropriate due to intensity of illness or inability to take PO   Dispo: The patient is from: Home              Anticipated d/c is to: To be determined              Anticipated d/c date is: 3 days              Patient currently is not medically stable to d/c.        Consultants:   GI, [facility]  CCM  Procedures:     Antimicrobials:    Subjective: He is alert, he was able to tell me his name, answer few questions.   Objective: Vitals:   05/06/20 0421 05/06/20 0500 05/06/20 0816 05/06/20 1201  BP: 117/84  119/76 122/78  Pulse: 93  94 (!) 103  Resp: (!) 35  (!) 27 (!) 30  Temp: 98 F (36.7 C)  98.2 F (36.8 C) 98.1 F (36.7 C)  TempSrc: Oral  Oral Oral  SpO2: 97%  98% 97%  Weight:  81.9 kg    Height:        Intake/Output Summary (Last 24 hours) at 05/06/2020 1238 Last data filed at 05/06/2020 1100 Gross per 24 hour  Intake --  Output 1950 ml  Net -1950 ml   Filed Weights   05/03/20 2029 05/05/20 0500 05/06/20 0500  Weight: 77.6 kg 83.4 kg 81.9 kg    Examination:  General exam: Chronic ill, alert, icteric Respiratory system: Normal respiratory effort, no wheezing.  Cardiovascular system: S 1, S 2 RRR Gastrointestinal system: BS present, soft, nt Central nervous system: alert, answer few question, less confuse Extremities: no edema   Data Reviewed: I have personally reviewed following labs and imaging studies  CBC: Recent Labs  Lab 05/02/20 0426 05/02/20 0426 05/03/20 0413  05/03/20 2016 05/04/20 0406 05/05/20 0553 05/06/20 0500  WBC 28.3*  --  27.9*  --  29.4* 33.1* 34.6*  HGB 10.3*   < > 9.2* 9.5* 10.1* 10.9* 10.8*  HCT 28.6*   < > 25.4* 25.9* 27.8* 30.3* 30.3*  MCV 102.1*  --  101.2*  --  101.8* 103.4* 104.5*  PLT 132*  --  85*  --  81* 81* 88*   < > = values in this interval not displayed.   Basic Metabolic Panel: Recent Labs  Lab 04/30/20 0500 04/30/20 0500 05/02/20 0426 05/02/20 0426 05/03/20 0413 05/04/20 0406 05/04/20 1650 05/05/20 0553 05/06/20 0500  NA 137   < > 131*  --  128* 132*  --  135 142  K 4.0   < > 4.6  --  3.8 3.6  --  3.1* 2.8*  CL 101   < > 98  --  98 99  --  106 110  CO2 25   < > 22  --  20* 21*  --  19* 19*  GLUCOSE 96   < > 100*  --  126* 121*  --  138* 138*  BUN 20   < > 43*  --  58* 66*  --  76* 67*  CREATININE 1.84*   < > 4.06*  --  3.80* 3.43*  --  3.00* 2.19*  CALCIUM 7.3*   < > 6.3*  --  6.3* 6.8*  --  7.0* 7.7*  MG 2.3  --   --   --   --   --  2.4 2.4 2.3  PHOS 2.8   < > 3.3   < > 3.0 3.3 3.7 4.4 3.2   < > = values in this interval not displayed.   GFR: Estimated Creatinine Clearance: 41.6 mL/min (A) (by C-G formula based on SCr of 2.19 mg/dL (H)). Liver Function Tests: Recent Labs  Lab 05/02/20 0426 05/03/20 0413 05/04/20 0406 05/05/20 0553 05/06/20 0500  AST 213* 150* 151* 165* 139*  ALT 72* 65* 66* 79* 75*  ALKPHOS 178* 137* 142* 153* 168*  BILITOT 27.5* 28.4* 31.3* 34.5* 34.6*  PROT 5.3* 5.4* 5.7* 5.8* 6.0*  ALBUMIN 2.1* 2.5* 2.4* 2.3* 2.2*   No results for input(s): LIPASE, AMYLASE in the last 168 hours. Recent Labs  Lab 05/03/20 2016 05/05/20 0553 05/06/20 0500  AMMONIA 111* 77* 62*   Coagulation Profile: Recent Labs  Lab 05/03/20 2016 05/04/20 0406  INR 2.3* 2.2*   Cardiac Enzymes: No results for input(s): CKTOTAL, CKMB, CKMBINDEX, TROPONINI in the last 168 hours. BNP (last 3 results) No results for input(s): PROBNP in the last 8760 hours. HbA1C: No results for input(s):  HGBA1C in the last 72 hours. CBG: Recent Labs  Lab 05/05/20 1620 05/05/20 2048 05/06/20 0042 05/06/20 0419 05/06/20 1227  GLUCAP 140* 138* 141* 139* 132*   Lipid Profile: No results for input(s): CHOL, HDL, LDLCALC, TRIG, CHOLHDL, LDLDIRECT in the last 72 hours. Thyroid Function Tests: No results for input(s): TSH, T4TOTAL, FREET4, T3FREE, THYROIDAB in the last 72 hours. Anemia Panel: No results for input(s): VITAMINB12, FOLATE, FERRITIN, TIBC, IRON, RETICCTPCT in the last 72 hours. Sepsis Labs: No results for input(s): PROCALCITON, LATICACIDVEN in the last 168 hours.  Recent Results (from the past 240 hour(s))  Culture, blood (routine x 2)     Status: None   Collection Time: 04/27/20  1:57 PM   Specimen: BLOOD  Result Value Ref Range Status   Specimen Description   Final    BLOOD RIGHT ANTECUBITAL Performed at [facility], [address], [place], [place] [address]    Special Requests   Final    BOTTLES DRAWN AEROBIC AND ANAEROBIC Blood Culture results may not be optimal due to an inadequate volume of blood received in culture bottles Performed at [facility], [address], [place], [place] [address]    Culture   Final    NO GROWTH 5 DAYS Performed at [facility], [address] [place], [place] [address]    Report Status 05/02/2020 FINAL  Final  SARS Coronavirus 2 by RT PCR (hospital order, performed in [facility] hospital lab) Nasopharyngeal Nasopharyngeal Swab     Status: None   Collection Time: 04/27/20  1:57 PM   Specimen: Nasopharyngeal Swab  Result Value Ref Range Status   SARS Coronavirus 2 NEGATIVE NEGATIVE Final    Comment: (NOTE) SARS-CoV-2 target nucleic acids are NOT DETECTED.  The SARS-CoV-2 RNA is generally detectable in upper  and lower respiratory specimens during the acute phase of infection. The lowest concentration of SARS-CoV-2 viral copies this assay can detect is 250 copies / mL. A negative  result does not preclude SARS-CoV-2 infection and should not be used as the sole basis for treatment or other patient management decisions.  A negative result may occur with improper specimen collection / handling, submission of specimen other than nasopharyngeal swab, presence of viral mutation(s) within the areas targeted by this assay, and inadequate number of viral copies (<250 copies / mL). A negative result must be combined with clinical observations, patient history, and epidemiological information.  Fact Sheet for Patients:   BoilerBrush.com.cy  Fact Sheet for Healthcare Providers: https://pope.com/  This test is not yet approved or  cleared by the [place] FDA and has been authorized for detection and/or diagnosis of SARS-CoV-2 by FDA under an Emergency Use Authorization (EUA).  This EUA will remain in effect (meaning this test can be used) for the duration of the COVID-19 declaration under Section 564(b)(1) of the Act, 21 U.S.C. section 360bbb-3(b)(1), unless the authorization is terminated or revoked sooner.  Performed at [facility], [address], [place], [place] [address]   Culture, blood (routine x 2)     Status: None   Collection Time: 04/27/20  2:37 PM   Specimen: BLOOD LEFT HAND  Result Value Ref Range Status   Specimen Description   Final    BLOOD LEFT HAND Performed at [facility], [address], [place], [place] [address]    Special Requests   Final    BOTTLES DRAWN AEROBIC AND ANAEROBIC Blood Culture results may not be optimal due to an inadequate volume of blood received in culture bottles Performed at [facility], [address] [place], [place] [address]    Culture   Final    NO GROWTH 5 DAYS Performed at [facility], [address], [place], [place] [address]    Report Status 05/02/2020 FINAL  Final  Body fluid culture  (includes gram stain)     Status: None   Collection Time: 04/27/20  5:26 PM   Specimen: Peritoneal Washings; Pleural Fluid  Result Value Ref Range Status   Specimen Description   Final    PERITONEAL FLUID Performed at [facility], [address] [place], [place] [address]    Special Requests   Final    NONE Performed at [facility], [address], [place], [place] [address]    Gram Stain   Final    WBC PRESENT, PREDOMINANTLY MONONUCLEAR NO ORGANISMS SEEN CYTOSPIN SMEAR    Culture   Final    NO GROWTH 3 DAYS Performed at [facility], [address], [place], [place] [address]    Report Status 05/01/2020 FINAL  Final  MRSA PCR Screening     Status: None   Collection Time: 04/27/20  8:44 PM   Specimen: Nasopharyngeal  Result Value Ref Range Status   MRSA by PCR NEGATIVE NEGATIVE Final    Comment:        The GeneXpert MRSA Assay (FDA approved for NASAL specimens only), is one component of a comprehensive MRSA colonization surveillance program. It is not intended to diagnose MRSA infection nor to guide or monitor treatment for MRSA infections. Performed at [facility], [address], [place], [place] [address]   MRSA PCR Screening     Status: None   Collection Time:  04/30/20  8:35 PM   Specimen: Nasopharyngeal  Result Value Ref Range Status   MRSA by PCR NEGATIVE NEGATIVE Final    Comment:        The GeneXpert MRSA Assay (FDA approved for NASAL specimens only), is one component of a comprehensive MRSA colonization surveillance program. It is not intended to diagnose MRSA infection nor to guide or monitor treatment for MRSA infections. Performed at [facility], [address] [place], [place] [address]   Gram stain     Status: None   Collection Time: 05/03/20  1:10 PM   Specimen: Abdomen; Peritoneal Fluid  Result Value Ref Range Status   Specimen Description ABDOMEN  Final   Special Requests  PERITONEAL FLUID  Final   Gram Stain   Final    WBC PRESENT,BOTH PMN AND MONONUCLEAR NO ORGANISMS SEEN CYTOSPIN SMEAR Performed at [facility], [address], [place], [place] [address]    Report Status 05/03/2020 FINAL  Final  Culture, body fluid-bottle     Status: None (Preliminary result)   Collection Time: 05/03/20  1:10 PM   Specimen: Peritoneal Washings  Result Value Ref Range Status   Specimen Description PERITONEAL FLUID  Final   Special Requests ABDOMEN  Final   Culture   Final    NO GROWTH 2 DAYS Performed at [facility], [address], [place], [place] [address]    Report Status PENDING  Incomplete  Urine Culture     Status: None   Collection Time: 05/04/20  5:23 PM   Specimen: Urine, Random  Result Value Ref Range Status   Specimen Description URINE, RANDOM  Final   Special Requests NONE  Final   Culture   Final    NO GROWTH Performed at [facility], [address], [place], [place] [address]    Report Status 05/05/2020 FINAL  Final         Radiology Studies: DG Chest 2 View  Result Date: 05/04/2020 CLINICAL DATA:  Cough. EXAM: CHEST - 2 VIEW COMPARISON:  April 28, 2020. FINDINGS: The heart size and mediastinal contours are within normal limits. Feeding tube is seen entering stomach. Right internal jugular catheter is unchanged. No pneumothorax is noted. Hypoinflation of the lungs is noted with mild bibasilar subsegmental atelectasis. No significant pleural effusion is noted. The visualized skeletal structures are unremarkable. IMPRESSION: Hypoinflation of the lungs with mild bibasilar subsegmental atelectasis. Electronically Signed   By: Lupita Raider M.D.   On: 05/04/2020 13:58   DG Abd 1 View  Result Date: 05/04/2020 CLINICAL DATA:  Feeding tube placement EXAM: ABDOMEN - 1 VIEW COMPARISON:  None. FINDINGS: Feeding tube placed in the Radiology department. Documenting film shows soft feeding tube tip at the ligament of Treitz. Contrast  injected for confirmation. IMPRESSION: Soft feeding tube tip at the ligament of Treitz. Electronically Signed   By: Paulina Fusi M.D.   On: 05/04/2020 13:59        Scheduled Meds:  Chlorhexidine Gluconate Cloth  6 each Topical Q0600   feeding supplement (PROSource TF)  45 mL Per Tube BID   lactulose  20 g Oral TID   lactulose  300 mL Rectal BID   mouth rinse  15 mL Mouth Rinse BID   midodrine  15 mg Oral TID WC   pantoprazole (PROTONIX) IV  40 mg Intravenous Q12H   pentoxifylline  400 mg Oral Daily   prednisoLONE  30 mg Oral Q breakfast  rifaximin  550 mg Oral BID   sodium chloride flush  10-40 mL Intracatheter Q12H   thiamine  100 mg Oral Daily   Continuous Infusions:  sodium chloride Stopped (04/30/20 1614)   cefTRIAXone (ROCEPHIN)  IV 2 g (05/05/20 1443)   feeding supplement (VITAL 1.5 CAL) 1,000 mL (05/06/20 0300)   potassium chloride       LOS: 9 days    Time spent: 35 minutes.     Alba CoryBelkys A Izak Anding, MD [facility]   If 7PM-7AM, please contact night-coverage [email or web address]  05/06/2020, 12:38 PM

## 2020-05-07 ENCOUNTER — Inpatient Hospital Stay (HOSPITAL_COMMUNITY): Payer: Medicaid Other

## 2020-05-07 DIAGNOSIS — D72829 Elevated white blood cell count, unspecified: Secondary | ICD-10-CM

## 2020-05-07 DIAGNOSIS — B182 Chronic viral hepatitis C: Secondary | ICD-10-CM

## 2020-05-07 DIAGNOSIS — K7200 Acute and subacute hepatic failure without coma: Secondary | ICD-10-CM

## 2020-05-07 LAB — COMPREHENSIVE METABOLIC PANEL
ALT: 86 U/L — ABNORMAL HIGH (ref 0–44)
AST: 160 U/L — ABNORMAL HIGH (ref 15–41)
Albumin: 2 g/dL — ABNORMAL LOW (ref 3.5–5.0)
Alkaline Phosphatase: 161 U/L — ABNORMAL HIGH (ref 38–126)
Anion gap: 11 (ref 5–15)
BUN: 65 mg/dL — ABNORMAL HIGH (ref 6–20)
CO2: 18 mmol/L — ABNORMAL LOW (ref 22–32)
Calcium: 7.8 mg/dL — ABNORMAL LOW (ref 8.9–10.3)
Chloride: 111 mmol/L (ref 98–111)
Creatinine, Ser: 1.98 mg/dL — ABNORMAL HIGH (ref 0.61–1.24)
GFR calc Af Amer: 46 mL/min — ABNORMAL LOW (ref >60)
GFR calc non Af Amer: 40 mL/min — ABNORMAL LOW (ref >60)
Glucose, Bld: 165 mg/dL — ABNORMAL HIGH (ref 70–99)
Potassium: 3.9 mmol/L (ref 3.5–5.1)
Sodium: 140 mmol/L (ref 135–145)
Total Bilirubin: 36.8 mg/dL (ref 0.3–1.2)
Total Protein: 6.1 g/dL — ABNORMAL LOW (ref 6.5–8.1)

## 2020-05-07 LAB — CBC
HCT: 28.7 % — ABNORMAL LOW (ref 39.0–52.0)
HCT: 27.5 % — ABNORMAL LOW (ref 39.0–52.0)
Hemoglobin: 10.2 g/dL — ABNORMAL LOW (ref 13.0–17.0)
Hemoglobin: 9.6 g/dL — ABNORMAL LOW (ref 13.0–17.0)
MCH: 37.2 pg — ABNORMAL HIGH (ref 26.0–34.0)
MCH: 36.8 pg — ABNORMAL HIGH (ref 26.0–34.0)
MCHC: 35.5 g/dL (ref 30.0–36.0)
MCHC: 34.9 g/dL (ref 30.0–36.0)
MCV: 104.7 fL — ABNORMAL HIGH (ref 80.0–100.0)
MCV: 105.4 fL — ABNORMAL HIGH (ref 80.0–100.0)
Platelets: 108 10*3/uL — ABNORMAL LOW (ref 150–400)
Platelets: 125 10*3/uL — ABNORMAL LOW (ref 150–400)
RBC: 2.74 MIL/uL — ABNORMAL LOW (ref 4.22–5.81)
RBC: 2.61 MIL/uL — ABNORMAL LOW (ref 4.22–5.81)
RDW: 22.4 % — ABNORMAL HIGH (ref 11.5–15.5)
RDW: 22.5 % — ABNORMAL HIGH (ref 11.5–15.5)
WBC: 38.9 10*3/uL — ABNORMAL HIGH (ref 4.0–10.5)
WBC: 40.1 10*3/uL — ABNORMAL HIGH (ref 4.0–10.5)
nRBC: 0 % (ref 0.0–0.2)
nRBC: 0 % (ref 0.0–0.2)

## 2020-05-07 LAB — GRAM STAIN

## 2020-05-07 LAB — BODY FLUID CELL COUNT WITH DIFFERENTIAL
Lymphs, Fluid: 17 %
Monocyte-Macrophage-Serous Fluid: 21 % — ABNORMAL LOW (ref 50–90)
Neutrophil Count, Fluid: 62 % — ABNORMAL HIGH (ref 0–25)
Total Nucleated Cell Count, Fluid: 1090 cu mm — ABNORMAL HIGH (ref 0–1000)

## 2020-05-07 LAB — GLUCOSE, CAPILLARY
Glucose-Capillary: 123 mg/dL — ABNORMAL HIGH (ref 70–99)
Glucose-Capillary: 124 mg/dL — ABNORMAL HIGH (ref 70–99)
Glucose-Capillary: 124 mg/dL — ABNORMAL HIGH (ref 70–99)
Glucose-Capillary: 129 mg/dL — ABNORMAL HIGH (ref 70–99)
Glucose-Capillary: 134 mg/dL — ABNORMAL HIGH (ref 70–99)
Glucose-Capillary: 153 mg/dL — ABNORMAL HIGH (ref 70–99)
Glucose-Capillary: 162 mg/dL — ABNORMAL HIGH (ref 70–99)

## 2020-05-07 LAB — HEPATITIS PANEL, ACUTE
HCV Ab: REACTIVE — AB
Hep A IgM: NONREACTIVE
Hep B C IgM: NONREACTIVE
Hepatitis B Surface Ag: NONREACTIVE

## 2020-05-07 LAB — TYPE AND SCREEN
ABO/RH(D): A POS
Antibody Screen: NEGATIVE

## 2020-05-07 LAB — PROTIME-INR
INR: 2 — ABNORMAL HIGH (ref 0.8–1.2)
Prothrombin Time: 21.7 seconds — ABNORMAL HIGH (ref 11.4–15.2)

## 2020-05-07 LAB — HEMOGLOBIN AND HEMATOCRIT, BLOOD
HCT: 24.1 % — ABNORMAL LOW (ref 39.0–52.0)
Hemoglobin: 8.5 g/dL — ABNORMAL LOW (ref 13.0–17.0)

## 2020-05-07 MED ORDER — SODIUM CHLORIDE 0.9 % IV SOLN
2.0000 g | Freq: Two times a day (BID) | INTRAVENOUS | Status: AC
  Administered 2020-05-07 – 2020-05-11 (×7): 2 g via INTRAVENOUS
  Filled 2020-05-07 (×8): qty 2

## 2020-05-07 MED ORDER — HYDROMORPHONE HCL 1 MG/ML IJ SOLN: 0.2500 mg | Freq: Four times a day (QID) | INTRAMUSCULAR | Status: DC

## 2020-05-07 MED ORDER — ALBUMIN HUMAN 25 % IV SOLN
0.0000 g | Freq: Once | INTRAVENOUS | Status: AC
  Administered 2020-05-11: 12.5 g via INTRAVENOUS
  Filled 2020-05-07 (×2): qty 400

## 2020-05-07 MED ORDER — ALBUMIN HUMAN 5 % IV SOLN
25.0000 g | Freq: Once | INTRAVENOUS | Status: AC
  Administered 2020-05-07: 25 g via INTRAVENOUS
  Filled 2020-05-07 (×2): qty 500

## 2020-05-07 MED ORDER — LIDOCAINE HCL (PF) 1 % IJ SOLN
INTRAMUSCULAR | Status: AC
  Filled 2020-05-07: qty 30

## 2020-05-07 MED ORDER — OCTREOTIDE LOAD VIA INFUSION
50.0000 ug | Freq: Once | INTRAVENOUS | Status: AC
  Administered 2020-05-07: 50 ug via INTRAVENOUS
  Filled 2020-05-07: qty 25

## 2020-05-07 MED ORDER — LORAZEPAM 0.5 MG PO TABS
0.2500 mg | ORAL_TABLET | Freq: Three times a day (TID) | ORAL | Status: DC
  Administered 2020-05-07 – 2020-05-11 (×12): 0.25 mg via ORAL
  Filled 2020-05-07 (×12): qty 1

## 2020-05-07 MED ORDER — HYDROMORPHONE HCL 2 MG PO TABS
2.0000 mg | ORAL_TABLET | Freq: Four times a day (QID) | ORAL | Status: DC
  Administered 2020-05-07: 2 mg via ORAL
  Filled 2020-05-07: qty 1

## 2020-05-07 MED ORDER — SODIUM CHLORIDE 0.9 % IV SOLN
50.0000 ug/h | INTRAVENOUS | Status: DC
  Administered 2020-05-07 – 2020-05-08 (×3): 50 ug/h via INTRAVENOUS
  Filled 2020-05-07 (×4): qty 1

## 2020-05-07 MED ORDER — HYDROMORPHONE HCL 1 MG/ML IJ SOLN
0.5000 mg | Freq: Once | INTRAMUSCULAR | Status: AC
  Administered 2020-05-07: 0.5 mg via INTRAVENOUS

## 2020-05-07 MED ORDER — SIMETHICONE 80 MG PO CHEW: 80.0000 mg | CHEWABLE_TABLET | Freq: Four times a day (QID) | ORAL | Status: DC | PRN

## 2020-05-07 MED ORDER — SODIUM CHLORIDE 0.9% IV SOLUTION: Freq: Once | INTRAVENOUS | Status: AC

## 2020-05-07 NOTE — Progress Notes (Addendum)
Daily Progress Note   Patient Name: Alan Becker       Date: 05/07/2020 DOB: [birth year]  Age: 44 y.o. MRN#: [id number] Attending Physician: Alba Cory, MD Primary Care Physician: [name], MD Admit Date: 04/27/2020  Reason for Consultation/Follow-up:To discuss complex medical decision making related to patient's goals of care  Subjective: Alan Becker is awake and attempts to respond to me today.  Mindi Junker is at bedside.  We talked for a while in the conference room.  Security was called on [place] last night.  [place] was having significant abdominal pain.  Per Mindi Junker they had requested pain medication before 7 pm shift change.  The night nurse came on and gave pain medication at 8:20.  Mindi Junker refused to leave at 8 pm as her son was in pain and she needed to ensure he was comfortable.   Per Mindi Junker she was threatened that the [place] police would be called and she would be banned from the hospital.  We discussed pain control.  I explained that Vandell's liver can not clear pain medicines very well.  Consequently the RNs may err on the side of safety and give pain medications only when necessary because they want him to improve.  Mindi Junker understands.  She feels he can not press the red button and request pain medication on his own.   We discussed scheduling a small dose of oral pain medication in addition to the PRN dose.   Assessment: Patient still with severe encephalopathy though somewhat clinically improved compared to 2 days ago.   Acute on chronic liver disease, acute on chronic kidney disease. Very fragile.   Patient Profile/HPI:  44 y.o. male  with past medical history of alcohol abuse, severe depression, HTN who was admitted on 04/27/2020 with confusion, weakness and changes in skin color. Per  his mother the patient was a heavy drinker for the past 8 years but had stopped drinking 3 - 4 weeks ago.  Per admission H&P:  Work-up in the ED consistent with acute renal failure creatinine of 8.5, albumin 1.9, bilirubin of 23, AST elevated and white count of 15. 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 On 7/9 he was a started on CRRT, GI renal following, underwent paracentesis on 7/8 with 30 cc of dark yellow fluid  which was negative for SBP. He underwent repeated paracentesis on 7/14 yielding 3 L of fluid.  Patient developed worsening encephalopathy on 7/15, not following commands, more lethargic. CCM consulted for further evaluation and concern for decompensation. Plan to transfer to progressive care unit, support care, NG tube placement for medications.    Length of Stay: 10   Vital Signs: BP 124/77 (BP Location: Right Arm)   Pulse 92   Temp 98.2 F (36.8 C) (Oral)   Resp 19   Ht 5\' 8"  (1.727 m)   Wt 81.9 kg   SpO2 95%   BMI 27.45 kg/m  SpO2: SpO2: 95 % O2 Device: O2 Device: Room Air O2 Flow Rate: O2 Flow Rate (L/min): 3 L/min       Palliative Assessment/Data: 20%     Palliative Care Plan    Recommendations/Plan:  Very fragile.  Poor long term prognosis.  Remains encephalopathic  Per [facility] visitor policy 1 designated visitor is allow to be with him 24 hours a day.  Will schedule very low dose oral dilaudid q 6 hours in addition to PRN pain medication.  Family is very concerned about patient's comfort.  Addendum:  Discussed with Dr. will discontinue scheduled dilaudid and utilize very low dose scheduled benzodiazepine instead as this will be easier on his liver.    Code Status:  DNR  Prognosis:   likely weeks.  Very poor PO intake. Decompensated cirrhosis, kidney failure.  Discharge Planning:  To Be Determined  Care plan was discussed with mother, RN, [facility] MD.  Thank you for allowing the Palliative  Medicine Team to assist in the care of this patient.  Total time spent:  45 min.     Greater than 50%  of this time was spent counseling and coordinating care related to the above assessment and plan.  [facility], PA-C Palliative Medicine  Please contact Palliative MedicineTeam phone at [phone] for questions and concerns between 7 am - 7 pm.   Please see AMION for individual provider pager numbers.

## 2020-05-07 NOTE — Progress Notes (Signed)
Notified by RN of melenic stool.  Agree w/ plan for stat cbc.  Will start octreotide empirically. (Pt already on pantoprazole.)  Will make NPO after midnight.  If pt destabilized despite octreotide, may need egd tomorrow.  Florencia Reasons, M.D. Pager [phone] If no answer or after 5 PM call [phone]

## 2020-05-07 NOTE — Progress Notes (Signed)
Patient remains gravely ill, but is making some progress in terms of progressive improvement in renal function and improvement in mental status.  On the other hand, unfortunately his bilirubin continues to rise slightly.  The patient was having significant abdominal pain yesterday, but this has substantially improved since this morning's paracentesis of approximately 4 L.  Cell count normal  ID consult reviewed; patient's hepatitis C positivity is certainly important information although it would appear that at this point, the patient's liver status has progressed so far that it is unclear whether antiviral therapy would affect the patient's outcome.  The patient is extremely thirsty; in fact, this is currently his chief complaint.  Since he is no longer oliguric, and since his serum sodium is normal, I see no need for fluid restriction.  I have discussed this with Dr. Sunnie Nielsen and we agree on allowing ad lib. fluid intake.  Goals of care and overall management were discussed with Norvel Richards, PA-C.  For now, low-dose Ativan is anticipated to help relax patient, hopefully without leading to excessive sedation.  Exam: Patient is sitting up in the bedside chair.  He is oriented to place but not to month or year.  He has an occasional beat of asterixis.  The abdomen at this time remains moderately distended, but seems to be considerably softer than yesterday, and is nontender.  Labs: Hepatitis C antibody reactive, total bilirubin 37, creatinine 1.98, cell count on fluid 1090 (62% polys)  Impression:  1.  Alcoholic hepatitis with rising bilirubin 2.  Hepatitis C antibody positivity 3.  Resolving renal dysfunction,? Type I hepatorenal syndrome 4.  Altered mental status, presumably hepatic encephalopathy, improving 5.  Abdominal pain, presumably related to ascites and/or SBP, improved after paracentesis 6.  Elevated cell count on today's paracentesis fluid (markedly increased compared to 4 days  ago), total neutrophil count approximately 700 suggestive of SBP  Plan:  1.  The patient is extremely thirsty and we will liberalize his fluid restriction in view of resolution of his oliguria, as discussed above  2.  Ativan per palliative care, will need to watch for oversedation  3.  Agree with checking hepatitis C viral quantitation  4.  Resume antibiotic therapy because of questionable SBP based on today's fluid (discussed with Dr. Cliffton Asters of infectious diseases; since this occurred while the patient was on ceftriaxone, he recommends initiating Cefepime, with dosing by pharmacy)  Time spent on floor and coordinating care and discussing with consultants and attending MD approximately 50 minutes  Florencia Reasons, M.D. Pager [phone] If no answer or after 5 PM call [phone]

## 2020-05-07 NOTE — Progress Notes (Addendum)
Pt had stool that appeared sticky and bloody. Was a maroon color where pooling with red edges on pad. Dr. Sunnie Nielsen was text-paged to make aware.   1800: Dr. Sunnie Nielsen called back with verbal orders for STAT CBC and INR, and requested RN to alert GI MD. She confirmed that he is on BID IV Protonix. Message left with receptionist for Dr. Matthias Hughs.  1820: Discussed pt condition with Dr. Matthias Hughs, who gave new orders for Ocreotide and NPO after midnight for potential scope tomorrow.   Pt NG tube clamped per Dr. Sunnie Nielsen, who has informed pt and family of new orders and plans. Human Albumin currently infusing. Will place order for second PIV. Ocreotide and FFP will be given by nightshift RN, Morrison Old as soon as they are verified by pharmacy and blood bank.  Robina Ade, RN

## 2020-05-07 NOTE — Progress Notes (Signed)
Pharmacy Antibiotic Note  Alan Becker is a 44 y.o. male admitted on 04/27/2020 with SBP wile on Ceftriaxone .  Pharmacy has been consulted for Cefepime dosing.   Height: 5\' 8"  (172.7 cm) Weight: 81.9 kg (180 lb 8.9 oz) IBW/kg (Calculated) : 68.4  Temp (24hrs), Avg:98 F (36.7 C), Min:97.6 F (36.4 C), Max:98.4 F (36.9 C)  Recent Labs  Lab 05/03/20 0413 05/04/20 0406 05/05/20 0553 05/06/20 0500 05/07/20 0416  WBC 27.9* 29.4* 33.1* 34.6* 38.9*  CREATININE 3.80* 3.43* 3.00* 2.19* 1.98*    Estimated Creatinine Clearance: 46.1 mL/min (A) (by C-G formula based on SCr of 1.98 mg/dL (H)).    Allergies  Allergen Reactions  . Penicillins Other (See Comments)    Did it involve swelling of the face/tongue/throat, SOB, or low BP? No Did it involve sudden or severe rash/hives, skin peeling, or any reaction on the inside of your mouth or nose? Rash Did you need to seek medical attention at a hospital or doctor's office? Yes When did it last happen?Child If all above answers are "NO", may proceed with cephalosporin use.      Antimicrobials this admission: 7/18 Cefepime >>  7/14 Ceftriaxone >> 7/17  Dose adjustments this admission: N/a  Microbiology results: Pending   Plan:  - Cefepime 2g IV q12h - Monitor patients renal function and urine output  - De-escalate ABX when appropriate   Thank you for allowing pharmacy to be a part of this patient's care.  8/17 PharmD. BCPS 05/07/2020 4:55 PM

## 2020-05-07 NOTE — Procedures (Signed)
  [place] LLQ guided paracentesis  3.8 L bright yellow fluid Sent for labs per MD  Tolerated well

## 2020-05-07 NOTE — Progress Notes (Addendum)
PROGRESS NOTE    Alan Becker  GEX:[id number] DOB: Jun 09, 1976 DOA: 04/27/2020 PCP: [name], MD   Brief Narrative: 44 year old with past medical history significant for alcohol abuse who was brought to the ED by family who noticed that he has been very confused and having skin color changes.  He has a history of depression and has been drinking heavily for the last 8 years since his divorce.  His mother gave most of the history because, patient has been confused.  Mother reporter that patient has been experiencing abdominal bloating and pain, fatigue and malaise for the last 3 to 4 weeks.  Work-up in the ED consistent with acute renal failure creatinine of 8.5, albumin 1.9, bilirubin of 23, AST elevated and white count of 15.  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  On 7/9 he was a started on CRRT, GI renal following, underwent paracentesis on 7/8 with 30 cc of dark yellow fluid which was negative for SBP.  His care was transferred to [facility] service on 7/13.  He underwent repeated paracentesis on 7/14 yielding 3 L of fluid.   Patient develops worsening encephalopathy on 7/15, not following command, more lethargic. CCM consulted for further evaluation and concern for decompensation. Plan to transfer to progressive care unit, support care, NG tube placement for medications.     Assessment & Plan:   Active Problems:   Hepatorenal syndrome (HCC)   AKI (acute kidney injury) (HCC)   Palliative care encounter   Acute liver failure without hepatic coma   Encephalopathy acute   DNR (do not resuscitate)  1-Acute renal failure, hepatorenal syndrome: Nephrology consulted and following. Patient was a started on midodrine/octreotide/albumin No acute indication for dialysis, plan to monitor renal function and urine output for the next several days. Urine out put yesterday 776. Cr continue to decrease today at 1.9 HD catheter removed 7/17.  Acute  decompensated alcoholic liver cirrhosis -[facility] consulted.  Dr. Bosie Clos saw the patient earlier during this admission. -Patient was a started on prednisone, his discriminate function was 21 -He was deemed not a candidate for liver transplant given recent alcohol use -Symptomatic ascites, patient underwent repeat paracentesis on 7/14 follow fluid results: culture no growth to date. Pathology report pending, WBC 196. On empiric antibiotics.  -Added ceftriaxone for SBP prophylaxis due to concern for GI bleed -Worsening mental status, ammonium up, plan for rifaximin and lactulose pre NG tube.  -CCM consulted. Transfer to progressive care.  -he was alert, able to answer few questions today.  -he has been able to take rifaximin, discontinue lactulose enema.  -started on Pentoxifylline.  -worsening LFT, bilirrubin up to 36. Worsening abdominal pain, distension. Plan for paracentesis. Continue with support care.  -Viral hepatitis panel ordered.   Acute hypoxic, respiratory failure; increase oxygen supply.  Chest x ray, Hypoinflation.   Respiratory alkalosis; support care   Leukocytosis: Follow trend could be related to prednisone. On IV ceftriaxone to cover for infectious process.  Blood cultures; pending.  ID consulted.   Hyponatremia; might be related to renal failure, liver failure.   Concern for GI bleed: Mother noticed blood in the toilet, continue to monitor for any evidence of GI bleed.  Monitor hemoglobin. Started on IV Protonix. GI following.  Hb has remain stable   Long-term alcohol abuse: Counseling provided Continue with thiamine.  Abnormal liver lesion: CT abdomen on 7/8 noted questionable focal hypoattenuation mass in the liver not well characterized, recommend a dedicated  liver protocol CT or MRI when more stable  Major depression: Not appropriate for psychotropic med in the setting of decompensated cirrhosis  Hypokalemia; replete IV> and orally   Addendum;  GI;  bleed;  Per nurse patient had maroon stool, bloody edges.  Came to see patient, he is alert, denies abdominal pain. Vitals stables.  Will stop Trental, which could increase risk of bleeding.  Discussed with DR Buccini, start octeotride, one unit FFP.  Monitor for further bleeding.  On IV protonix.     Nutrition Problem: Inadequate oral intake Etiology: poor appetite    Signs/Symptoms: per patient/family report    Interventions: Ensure Enlive (each supplement provides 350kcal and 20 grams of protein), Education, Prostat  Estimated body mass index is 27.45 kg/m as calculated from the following:   Height as of this encounter: 5\' 8"  (1.727 m).   Weight as of this encounter: 81.9 kg.   DVT prophylaxis: SCDs, hold heparin Code Status: Full code Family Communication: plan for palliative care consulted. Patient with poor prognosis. Mother 7/18 Disposition Plan:  Status is: Inpatient  Remains inpatient appropriate because:IV treatments appropriate due to intensity of illness or inability to take PO   Dispo: The patient is from: Home              Anticipated d/c is to: To be determined              Anticipated d/c date is: 3 days              Patient currently is not medically stable to d/c.        Consultants:   GI, [facility]  CCM  Procedures:     Antimicrobials:    Subjective: He is alert, he is complaining of abdominal pain, cramping pain, abdominal distension.    Objective: Vitals:   05/07/20 0041 05/07/20 0100 05/07/20 0400 05/07/20 0700  BP: 110/82 111/81 118/79   Pulse: 94 91 92   Resp: (!) 30 (!) 24 (!) 24 19  Temp:   98.4 F (36.9 C) 97.6 F (36.4 C)  TempSrc:   Oral Oral  SpO2: 98% 95%    Weight:      Height:        Intake/Output Summary (Last 24 hours) at 05/07/2020 0824 Last data filed at 05/07/2020 0600 Gross per 24 hour  Intake 3983.17 ml  Output 700 ml  Net 3283.17 ml   Filed Weights   05/03/20 2029 05/05/20 0500 05/06/20  0500  Weight: 77.6 kg 83.4 kg 81.9 kg    Examination:  General exam: Chronic ill appearing, icteric  Respiratory system: tachypnea,  BL air movement.  Cardiovascular system: S 1, S 2 RRR Gastrointestinal system:BS present, soft, distended, tenderness to palpation.  Central nervous system: alert, follows command.  Extremities: no edema   Data Reviewed: I have personally reviewed following labs and imaging studies  CBC: Recent Labs  Lab 05/03/20 0413 05/03/20 0413 05/03/20 2016 05/04/20 0406 05/05/20 0553 05/06/20 0500 05/07/20 0416  WBC 27.9*  --   --  29.4* 33.1* 34.6* 38.9*  HGB 9.2*   < > 9.5* 10.1* 10.9* 10.8* 10.2*  HCT 25.4*   < > 25.9* 27.8* 30.3* 30.3* 28.7*  MCV 101.2*  --   --  101.8* 103.4* 104.5* 104.7*  PLT 85*  --   --  81* 81* 88* 108*   < > = values in this interval not displayed.   Basic Metabolic Panel: Recent Labs  Lab 05/03/20 0413  05/04/20 0406 05/04/20 1650 05/05/20 0553 05/06/20 0500 05/07/20 0416  NA 128* 132*  --  135 142 140  K 3.8 3.6  --  3.1* 2.8* 3.9  CL 98 99  --  106 110 111  CO2 20* 21*  --  19* 19* 18*  GLUCOSE 126* 121*  --  138* 138* 165*  BUN 58* 66*  --  76* 67* 65*  CREATININE 3.80* 3.43*  --  3.00* 2.19* 1.98*  CALCIUM 6.3* 6.8*  --  7.0* 7.7* 7.8*  MG  --   --  2.4 2.4 2.3  --   PHOS 3.0 3.3 3.7 4.4 3.2  --    GFR: Estimated Creatinine Clearance: 46.1 mL/min (A) (by C-G formula based on SCr of 1.98 mg/dL (H)). Liver Function Tests: Recent Labs  Lab 05/03/20 0413 05/04/20 0406 05/05/20 0553 05/06/20 0500 05/07/20 0416  AST 150* 151* 165* 139* 160*  ALT 65* 66* 79* 75* 86*  ALKPHOS 137* 142* 153* 168* 161*  BILITOT 28.4* 31.3* 34.5* 34.6* 36.8*  PROT 5.4* 5.7* 5.8* 6.0* 6.1*  ALBUMIN 2.5* 2.4* 2.3* 2.2* 2.0*   No results for input(s): LIPASE, AMYLASE in the last 168 hours. Recent Labs  Lab 05/03/20 2016 05/05/20 0553 05/06/20 0500  AMMONIA 111* 77* 62*   Coagulation Profile: Recent Labs  Lab  05/03/20 2016 05/04/20 0406  INR 2.3* 2.2*   Cardiac Enzymes: No results for input(s): CKTOTAL, CKMB, CKMBINDEX, TROPONINI in the last 168 hours. BNP (last 3 results) No results for input(s): PROBNP in the last 8760 hours. HbA1C: No results for input(s): HGBA1C in the last 72 hours. CBG: Recent Labs  Lab 05/06/20 1604 05/06/20 2027 05/07/20 0033 05/07/20 0430 05/07/20 0811  GLUCAP 161* 168* 162* 153* 129*   Lipid Profile: No results for input(s): CHOL, HDL, LDLCALC, TRIG, CHOLHDL, LDLDIRECT in the last 72 hours. Thyroid Function Tests: No results for input(s): TSH, T4TOTAL, FREET4, T3FREE, THYROIDAB in the last 72 hours. Anemia Panel: No results for input(s): VITAMINB12, FOLATE, FERRITIN, TIBC, IRON, RETICCTPCT in the last 72 hours. Sepsis Labs: No results for input(s): PROCALCITON, LATICACIDVEN in the last 168 hours.  Recent Results (from the past 240 hour(s))  Culture, blood (routine x 2)     Status: None   Collection Time: 04/27/20  1:57 PM   Specimen: BLOOD  Result Value Ref Range Status   Specimen Description   Final    BLOOD RIGHT ANTECUBITAL Performed at [facility], [address] [place], [place] [address]    Special Requests   Final    BOTTLES DRAWN AEROBIC AND ANAEROBIC Blood Culture results may not be optimal due to an inadequate volume of blood received in culture bottles Performed at [facility], [address] [place], [place] [address]    Culture   Final    NO GROWTH 5 DAYS Performed at [facility], [address], [place], [place] [address]    Report Status 05/02/2020 FINAL  Final  SARS Coronavirus 2 by RT PCR (hospital order, performed in [facility] hospital lab) Nasopharyngeal Nasopharyngeal Swab     Status: None   Collection Time: 04/27/20  1:57 PM   Specimen: Nasopharyngeal Swab  Result Value Ref Range Status   SARS Coronavirus 2 NEGATIVE NEGATIVE Final    Comment: (NOTE) SARS-CoV-2 target  nucleic acids are NOT DETECTED.  The SARS-CoV-2 RNA is generally detectable in upper and lower respiratory specimens during the acute phase of infection. The lowest concentration  of SARS-CoV-2 viral copies this assay can detect is 250 copies / mL. A negative result does not preclude SARS-CoV-2 infection and should not be used as the sole basis for treatment or other patient management decisions.  A negative result may occur with improper specimen collection / handling, submission of specimen other than nasopharyngeal swab, presence of viral mutation(s) within the areas targeted by this assay, and inadequate number of viral copies (<250 copies / mL). A negative result must be combined with clinical observations, patient history, and epidemiological information.  Fact Sheet for Patients:   BoilerBrush.com.cy  Fact Sheet for Healthcare Providers: https://pope.com/  This test is not yet approved or  cleared by the [place] FDA and has been authorized for detection and/or diagnosis of SARS-CoV-2 by FDA under an Emergency Use Authorization (EUA).  This EUA will remain in effect (meaning this test can be used) for the duration of the COVID-19 declaration under Section 564(b)(1) of the Act, 21 U.S.C. section 360bbb-3(b)(1), unless the authorization is terminated or revoked sooner.  Performed at [facility], [address], [place], [place] [address]   Culture, blood (routine x 2)     Status: None   Collection Time: 04/27/20  2:37 PM   Specimen: BLOOD LEFT HAND  Result Value Ref Range Status   Specimen Description   Final    BLOOD LEFT HAND Performed at [facility], [address], [place], [place] [address]    Special Requests   Final    BOTTLES DRAWN AEROBIC AND ANAEROBIC Blood Culture results may not be optimal due to an inadequate volume of blood received in culture bottles Performed at  [facility], [address], [place], [place] [address]    Culture   Final    NO GROWTH 5 DAYS Performed at [facility], [address] [place], [place] [address]    Report Status 05/02/2020 FINAL  Final  Body fluid culture (includes gram stain)     Status: None   Collection Time: 04/27/20  5:26 PM   Specimen: Peritoneal Washings; Pleural Fluid  Result Value Ref Range Status   Specimen Description   Final    PERITONEAL FLUID Performed at [facility], [address] [place], [place] [address]    Special Requests   Final    NONE Performed at [facility], [address] [place], [place] [address]    Gram Stain   Final    WBC PRESENT, PREDOMINANTLY MONONUCLEAR NO ORGANISMS SEEN CYTOSPIN SMEAR    Culture   Final    NO GROWTH 3 DAYS Performed at [facility], [address], [place], [place] [address]    Report Status 05/01/2020 FINAL  Final  MRSA PCR Screening     Status: None   Collection Time: 04/27/20  8:44 PM   Specimen: Nasopharyngeal  Result Value Ref Range Status   MRSA by PCR NEGATIVE NEGATIVE Final    Comment:        The GeneXpert MRSA Assay (FDA approved for NASAL specimens only), is one component of a comprehensive MRSA colonization surveillance program. It is not intended to diagnose MRSA infection nor to guide or monitor treatment for MRSA infections. Performed at [facility], [address] [place], [place] [address]   MRSA PCR Screening     Status: None   Collection Time: 04/30/20  8:35 PM   Specimen: Nasopharyngeal  Result Value Ref Range Status  MRSA by PCR NEGATIVE NEGATIVE Final    Comment:        The GeneXpert MRSA Assay (FDA approved for NASAL specimens only), is one component of a comprehensive MRSA colonization surveillance program. It is not intended to diagnose MRSA infection nor to guide or monitor treatment for MRSA infections. Performed at [facility], [address] [place], [place] [address]   Gram stain     Status: None   Collection Time: 05/03/20  1:10 PM   Specimen: Abdomen; Peritoneal Fluid  Result Value Ref Range Status   Specimen Description ABDOMEN  Final   Special Requests PERITONEAL FLUID  Final   Gram Stain   Final    WBC PRESENT,BOTH PMN AND MONONUCLEAR NO ORGANISMS SEEN CYTOSPIN SMEAR Performed at [facility], [address] [place], [place] [address]    Report Status 05/03/2020 FINAL  Final  Culture, body fluid-bottle     Status: None (Preliminary result)   Collection Time: 05/03/20  1:10 PM   Specimen: Peritoneal Washings  Result Value Ref Range Status   Specimen Description PERITONEAL FLUID  Final   Special Requests ABDOMEN  Final   Culture   Final    NO GROWTH 3 DAYS Performed at [facility], [address] [place], [place] [address]    Report Status PENDING  Incomplete  Urine Culture     Status: None   Collection Time: 05/04/20  5:23 PM   Specimen: Urine, Random  Result Value Ref Range Status   Specimen Description URINE, RANDOM  Final   Special Requests NONE  Final   Culture   Final    NO GROWTH Performed at [facility], [address] [place], [place] [address]    Report Status 05/05/2020 FINAL  Final  Culture, blood (routine x 2)     Status: None (Preliminary result)   Collection Time: 05/05/20  7:53 AM   Specimen: BLOOD  Result Value Ref Range Status   Specimen Description BLOOD LEFT ANTECUBITAL  Final   Special Requests   Final    BOTTLES DRAWN AEROBIC AND ANAEROBIC Blood Culture results may not be optimal due to an inadequate volume of blood received in culture bottles   Culture   Final    NO GROWTH 1 DAY Performed at [facility], [address] [place], [place] [address]    Report Status PENDING  Incomplete  Culture, blood (routine x 2)     Status: None (Preliminary result)   Collection Time: 05/05/20  7:57 AM   Specimen: BLOOD LEFT HAND  Result  Value Ref Range Status   Specimen Description BLOOD LEFT HAND  Final   Special Requests   Final    BOTTLES DRAWN AEROBIC AND ANAEROBIC Blood Culture adequate volume   Culture   Final    NO GROWTH 1 DAY Performed at [facility], [address], [place], [place] [address]    Report Status PENDING  Incomplete         Radiology Studies: No results found.      Scheduled Meds: . Chlorhexidine Gluconate Cloth  6 each Topical Q0600  . feeding supplement (PROSource TF)  45 mL Per Tube BID  . lactulose  20 g Oral TID  . mouth rinse  15 mL Mouth Rinse BID  . midodrine  15 mg Oral TID WC  . pantoprazole (PROTONIX) IV  40 mg Intravenous Q12H  . pentoxifylline  400 mg  Oral Daily  . prednisoLONE  30 mg Oral Q breakfast  . rifaximin  550 mg Oral BID  . sodium chloride flush  10-40 mL Intracatheter Q12H  . thiamine  100 mg Oral Daily   Continuous Infusions: . sodium chloride Stopped (04/30/20 1614)  . cefTRIAXone (ROCEPHIN)  IV 2 g (05/06/20 1402)  . feeding supplement (VITAL 1.5 CAL) 1,000 mL (05/07/20 0042)     LOS: 10 days    Time spent: 35 minutes.     Alba Cory, MD [facility]   If 7PM-7AM, please contact night-coverage [email or web address]  05/07/2020, 8:24 AM

## 2020-05-07 NOTE — Progress Notes (Signed)
Physical Therapy Treatment Patient Details Name: Alan Becker MRN: [id number] DOB: Mar 28, 1976 Today's Date: 05/07/2020    History of Present Illness 44 year old with past medical history significant for alcohol abuse who was brought to the ED by family who noticed that he has been very confused and having skin color changes.Work-up in the ED consistent with acute renal failure.  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  On 7/9 he was a started on CRRT, GI renal following, underwent paracentesis on 7/8 with 30 cc of dark yellow fluid which was negative for SBP.  His care was transferred to [facility] service on 7/13.  He underwent repeated paracentesis on 7/14 results pending.    PT Comments    Pt is more alert and demonstrates improved activity tolerance this session, although does still fatigue quickly. Pt requires reduced assistance to mobilize in bed and is able to transfer out of bed with PT assist for safety and due to pt's strength deficits. Pt will benefit from continued acute PT POC to improve activity tolerance, functional mobility, and strength. PT continues to recommend SNF placement at this time, if pt and family choose to D/C home DME is recommended below.   Follow Up Recommendations  SNF;Supervision/Assistance - 24 hour     Equipment Recommendations  Wheelchair (measurements PT);Wheelchair cushion (measurements PT);Hospital bed;3in1 (PT);Rolling walker with 5" wheels (if home today)    Recommendations for Other Services       Precautions / Restrictions Precautions Precautions: Fall Restrictions Weight Bearing Restrictions: No    Mobility  Bed Mobility Overal bed mobility: Needs Assistance Bed Mobility: Supine to Sit     Supine to sit: Min assist        Transfers Overall transfer level: Needs assistance Equipment used: None Transfers: Squat Pivot Transfers     Squat pivot transfers: Min assist     General  transfer comment: pt requires minA to power up and for safety  Ambulation/Gait                 Stairs             Wheelchair Mobility    Modified Rankin (Stroke Patients Only)       Balance Overall balance assessment: Needs assistance Sitting-balance support: Bilateral upper extremity supported;Feet supported Sitting balance-Leahy Scale: Poor Sitting balance - Comments: reliant on UE support                                    Cognition Arousal/Alertness: Awake/alert Behavior During Therapy: Flat affect Overall Cognitive Status: Impaired/Different from baseline Area of Impairment: Attention;Memory;Following commands;Safety/judgement;Problem solving;Awareness                   Current Attention Level: Focused Memory: Decreased recall of precautions;Decreased short-term memory Following Commands: Follows one step commands with increased time Safety/Judgement: Decreased awareness of safety;Decreased awareness of deficits Awareness: Intellectual Problem Solving: Slow processing;Requires verbal cues        Exercises      General Comments General comments (skin integrity, edema, etc.): VSS on RA, pt does fatigue quickly with activity, often requests to have something to drink      Pertinent Vitals/Pain Pain Assessment: Faces Faces Pain Scale: Hurts whole lot Pain Location: abdomen Pain Descriptors / Indicators: Grimacing Pain Intervention(s): Monitored during session    Home Living  Prior Function            PT Goals (current goals can now be found in the care plan section) Acute Rehab PT Goals Patient Stated Goal: family goal to improve mobility and activity tolerance Progress towards PT goals: Progressing toward goals    Frequency    Min 3X/week      PT Plan Current plan remains appropriate;Frequency needs to be updated    Co-evaluation              AM-PAC PT "6 Clicks" Mobility    Outcome Measure  Help needed turning from your back to your side while in a flat bed without using bedrails?: A Little Help needed moving from lying on your back to sitting on the side of a flat bed without using bedrails?: A Little Help needed moving to and from a bed to a chair (including a wheelchair)?: A Little Help needed standing up from a chair using your arms (e.g., wheelchair or bedside chair)?: A Lot Help needed to walk in hospital room?: Total Help needed climbing 3-5 steps with a railing? : Total 6 Click Score: 13    End of Session   Activity Tolerance: Patient limited by fatigue Patient left: in chair;with call bell/phone within reach;with chair alarm set Nurse Communication: Mobility status PT Visit Diagnosis: Other abnormalities of gait and mobility (R26.89);Muscle weakness (generalized) (M62.81)     Time: 8101-7510 PT Time Calculation (min) (ACUTE ONLY): 20 min  Charges:  $Therapeutic Activity: 8-22 mins                     Arlyss Gandy, PT, DPT Acute Rehabilitation Pager: [phone]    Arlyss Gandy 05/07/2020, 2:49 PM

## 2020-05-07 NOTE — Consult Note (Signed)
[facility]    Date of Admission:  04/27/2020           Day 5 ceftriaxone       Reason for Consult: Leukocytosis    Referring Provider: Dr. Hartley Barefoot  Assessment: I believe his worsening leukocytosis is most likely due to his prednisolone.  I favor stopping empiric ceftriaxone at this time.  It is certainly possible that his hepatitis C is a major contributor to his worsened liver function.  Plan: 1. Discontinue ceftriaxone 2. Check hepatitis C genotype and viral load  Active Problems:   Hepatorenal syndrome (HCC)   AKI (acute kidney injury) (HCC)   Acute liver failure without hepatic coma   Encephalopathy acute   Chronic hepatitis C with hepatic coma (HCC)   Depression with anxiety   Alcohol use disorder, severe, dependence (HCC)   Palliative care encounter   DNR (do not resuscitate)   Scheduled Meds: . Chlorhexidine Gluconate Cloth  6 each Topical Q0600  . feeding supplement (PROSource TF)  45 mL Per Tube BID  .  HYDROmorphone (DILAUDID) injection  0.25 mg Intravenous Q6H  . lactulose  20 g Oral TID  . mouth rinse  15 mL Mouth Rinse BID  . midodrine  15 mg Oral TID WC  . pantoprazole (PROTONIX) IV  40 mg Intravenous Q12H  . pentoxifylline  400 mg Oral Daily  . prednisoLONE  30 mg Oral Q breakfast  . rifaximin  550 mg Oral BID  . sodium chloride flush  10-40 mL Intracatheter Q12H  . thiamine  100 mg Oral Daily   Continuous Infusions: . sodium chloride Stopped (04/30/20 1614)  . albumin human    . cefTRIAXone (ROCEPHIN)  IV 2 g (05/06/20 1402)  . feeding supplement (VITAL 1.5 CAL) 1,000 mL (05/07/20 0042)   PRN Meds:.albuterol, alum & mag hydroxide-simeth, HYDROmorphone (DILAUDID) injection, ondansetron (ZOFRAN) IV, polyethylene glycol, prochlorperazine, simethicone, sodium chloride flush  HPI: Alan Becker is a 44 y.o. male with a history of heavy alcohol use who was admitted on 04/27/2020 with lethargy, confusion and  jaundice.  He had hepatitis complicated by acute kidney injury.  His white blood cell count was 15,600.  He was started on prednisolone and his white blood cell count has now risen to 38,900.  He has remained afebrile.  Urine and blood cultures have been negative.  He has undergone paracentesis x3.  No organisms have been seen on Gram stain and all cultures are negative to date.  No clear evidence of infection has been found on chest x-ray or abdominal CT scan.  This morning he was found to be hepatitis C antibody positive.   Review of Systems: Review of Systems  Unable to perform ROS: Mental acuity    Past Medical History:  Diagnosis Date  . Depression   . Hypertension   . Suicidal behavior     Social History   Tobacco Use  . Smoking status: Current Every Day Smoker    Packs/day: 1.00    Years: 20.00    Pack years: 20.00    Types: Cigarettes    Start date: 04/22/1995  . Smokeless tobacco: Never Used  Substance Use Topics  . Alcohol use: Yes    Alcohol/week: 5.0 standard drinks    Types: 5 Glasses of wine per week  . Drug use: No    Family History  Problem Relation Age of Onset  . Depression Mother   . Arthritis  Mother   . Cancer Neg Hx   . Diabetes Neg Hx   . Drug abuse Neg Hx   . Hearing loss Neg Hx   . Early death Neg Hx   . Heart disease Neg Hx   . Hyperlipidemia Neg Hx   . Hypertension Neg Hx   . Kidney disease Neg Hx    Allergies  Allergen Reactions  . Penicillins Other (See Comments)    Did it involve swelling of the face/tongue/throat, SOB, or low BP? No Did it involve sudden or severe rash/hives, skin peeling, or any reaction on the inside of your mouth or nose? Rash Did you need to seek medical attention at a hospital or doctor's office? Yes When did it last happen?Child If all above answers are "NO", may proceed with cephalosporin use.      OBJECTIVE: Blood pressure 124/77, pulse 92, temperature 98.2 F (36.8 C), temperature source Oral,  resp. rate 19, height 5\' 8"  (1.727 m), weight 81.9 kg, SpO2 95 %.  Physical Exam Constitutional:      Comments: His mother, is at the bedside.  He is very lethargic and does not answer questions.  He opens his eyes to voice.  Eyes:     Comments: He remains jaundiced.  Cardiovascular:     Rate and Rhythm: Normal rate and regular rhythm.     Heart sounds: No murmur heard.   Pulmonary:     Effort: Pulmonary effort is normal.     Breath sounds: Normal breath sounds.  Abdominal:     General: There is distension.     Palpations: Abdomen is soft.     Tenderness: There is abdominal tenderness.     Comments: 3.8 L of peritoneal fluid move this morning.  Musculoskeletal:        General: No swelling or tenderness.  Skin:    Findings: No rash.     Comments: Left arm IV site appears normal.     Lab Results Lab Results  Component Value Date   WBC 38.9 (H) 05/07/2020   HGB 10.2 (L) 05/07/2020   HCT 28.7 (L) 05/07/2020   MCV 104.7 (H) 05/07/2020   PLT 108 (L) 05/07/2020    Lab Results  Component Value Date   CREATININE 1.98 (H) 05/07/2020   BUN 65 (H) 05/07/2020   NA 140 05/07/2020   K 3.9 05/07/2020   CL 111 05/07/2020   CO2 18 (L) 05/07/2020    Lab Results  Component Value Date   ALT 86 (H) 05/07/2020   AST 160 (H) 05/07/2020   ALKPHOS 161 (H) 05/07/2020   BILITOT 36.8 (HH) 05/07/2020     Microbiology: Recent Results (from the past 240 hour(s))  Culture, blood (routine x 2)     Status: None   Collection Time: 04/27/20  1:57 PM   Specimen: BLOOD  Result Value Ref Range Status   Specimen Description   Final    BLOOD RIGHT ANTECUBITAL Performed at [facility], [address], [place], [place]    Special Requests   Final    BOTTLES DRAWN AEROBIC AND ANAEROBIC Blood Culture results may not be optimal due to an inadequate volume of blood received in culture bottles Performed at [facility], [address]  [place], [place]    Culture   Final    NO GROWTH 5 DAYS Performed at [facility], [address] [place], [place]    Report Status 05/02/2020  FINAL  Final  SARS Coronavirus 2 by RT PCR (hospital order, performed in [facility] hospital lab) Nasopharyngeal Nasopharyngeal Swab     Status: None   Collection Time: 04/27/20  1:57 PM   Specimen: Nasopharyngeal Swab  Result Value Ref Range Status   SARS Coronavirus 2 NEGATIVE NEGATIVE Final    Comment: (NOTE) SARS-CoV-2 target nucleic acids are NOT DETECTED.  The SARS-CoV-2 RNA is generally detectable in upper and lower respiratory specimens during the acute phase of infection. The lowest concentration of SARS-CoV-2 viral copies this assay can detect is 250 copies / mL. A negative result does not preclude SARS-CoV-2 infection and should not be used as the sole basis for treatment or other patient management decisions.  A negative result may occur with improper specimen collection / handling, submission of specimen other than nasopharyngeal swab, presence of viral mutation(s) within the areas targeted by this assay, and inadequate number of viral copies (<250 copies / mL). A negative result must be combined with clinical observations, patient history, and epidemiological information.  Fact Sheet for Patients:   BoilerBrush.com.cy  Fact Sheet for Healthcare Providers: https://pope.com/  This test is not yet approved or  cleared by the [place] FDA and has been authorized for detection and/or diagnosis of SARS-CoV-2 by FDA under an Emergency Use Authorization (EUA).  This EUA will remain in effect (meaning this test can be used) for the duration of the COVID-19 declaration under Section 564(b)(1) of the Act, 21 U.S.C. section 360bbb-3(b)(1), unless the authorization is terminated or revoked sooner.  Performed at [facility] [address], [place], [place] [address]   Culture, blood (routine x 2)     Status: None   Collection Time: 04/27/20  2:37 PM   Specimen: BLOOD LEFT HAND  Result Value Ref Range Status   Specimen Description   Final    BLOOD LEFT HAND Performed at [facility], [address], [place], [place] [address]    Special Requests   Final    BOTTLES DRAWN AEROBIC AND ANAEROBIC Blood Culture results may not be optimal due to an inadequate volume of blood received in culture bottles Performed at [facility], [address], [place], [place] [address]    Culture   Final    NO GROWTH 5 DAYS Performed at [facility], [address], [place], [place] [address]    Report Status 05/02/2020 FINAL  Final  Body fluid culture (includes gram stain)     Status: None   Collection Time: 04/27/20  5:26 PM   Specimen: Peritoneal Washings; Pleural Fluid  Result Value Ref Range Status   Specimen Description   Final    PERITONEAL FLUID Performed at [facility], [address] [place], [place] [address]    Special Requests   Final    NONE Performed at [facility], [address], [place], [place] [address]    Gram Stain   Final    WBC PRESENT, PREDOMINANTLY MONONUCLEAR NO ORGANISMS SEEN CYTOSPIN SMEAR    Culture   Final    NO GROWTH 3 DAYS Performed at [facility], [address] [place], [place] [address]    Report Status 05/01/2020 FINAL  Final  MRSA PCR Screening     Status: None   Collection Time: 04/27/20  8:44 PM   Specimen: Nasopharyngeal  Result Value Ref Range Status   MRSA by PCR NEGATIVE NEGATIVE Final  Comment:        The GeneXpert MRSA Assay (FDA approved for NASAL specimens only), is one component of a comprehensive MRSA colonization surveillance program. It is not intended to diagnose MRSA infection nor to guide or monitor treatment for MRSA infections. Performed at [facility], [address], [place], [place] [address]   MRSA PCR Screening     Status: None   Collection Time: 04/30/20  8:35 PM   Specimen: Nasopharyngeal  Result Value Ref Range Status   MRSA by PCR NEGATIVE NEGATIVE Final    Comment:        The GeneXpert MRSA Assay (FDA approved for NASAL specimens only), is one component of a comprehensive MRSA colonization surveillance program. It is not intended to diagnose MRSA infection nor to guide or monitor treatment for MRSA infections. Performed at [facility], [address] [place], [place] [address]   Gram stain     Status: None   Collection Time: 05/03/20  1:10 PM   Specimen: Abdomen; Peritoneal Fluid  Result Value Ref Range Status   Specimen Description ABDOMEN  Final   Special Requests PERITONEAL FLUID  Final   Gram Stain   Final    WBC PRESENT,BOTH PMN AND MONONUCLEAR NO ORGANISMS SEEN CYTOSPIN SMEAR Performed at [facility], [address] [place], [place] [address]    Report Status 05/03/2020 FINAL  Final  Culture, body fluid-bottle     Status: None (Preliminary result)   Collection Time: 05/03/20  1:10 PM   Specimen: Peritoneal Washings  Result Value Ref Range Status   Specimen Description PERITONEAL FLUID  Final   Special Requests ABDOMEN  Final   Culture   Final    NO GROWTH 3 DAYS Performed at [facility], [address], [place], [place] [address]    Report Status PENDING  Incomplete  Urine Culture     Status: None   Collection Time: 05/04/20  5:23 PM   Specimen: Urine, Random  Result Value Ref Range Status   Specimen Description URINE, RANDOM  Final   Special Requests NONE  Final   Culture   Final    NO GROWTH Performed at [facility], [address] [place], [place] [address]    Report Status 05/05/2020 FINAL  Final  Culture, blood (routine x 2)     Status: None (Preliminary result)   Collection Time: 05/05/20  7:53 AM   Specimen: BLOOD  Result Value Ref Range Status   Specimen  Description BLOOD LEFT ANTECUBITAL  Final   Special Requests   Final    BOTTLES DRAWN AEROBIC AND ANAEROBIC Blood Culture results may not be optimal due to an inadequate volume of blood received in culture bottles   Culture   Final    NO GROWTH 1 DAY Performed at [facility], [address], [place], [place] [address]    Report Status PENDING  Incomplete  Culture, blood (routine x 2)     Status: None (Preliminary result)   Collection Time: 05/05/20  7:57 AM   Specimen: BLOOD LEFT HAND  Result Value Ref Range Status   Specimen Description BLOOD LEFT HAND  Final   Special Requests   Final    BOTTLES DRAWN AEROBIC AND ANAEROBIC Blood Culture adequate volume   Culture   Final    NO GROWTH 1 DAY Performed at [facility], [address], [place], [place] [address]  Report Status PENDING  Incomplete    Cliffton AstersJohn Geraldy Akridge, MD [facility] [phone] pager   [phone] 05/07/2020, 1:21 PM

## 2020-05-07 NOTE — Progress Notes (Signed)
[facility] Progress Note  Subjective:    3.8L paracentesis this AM  Working with PT, in chair  0.7L UOP yesterday; SCr 1.98 further improved   Vitals:   05/07/20 1010 05/07/20 1015 05/07/20 1020 05/07/20 1216  BP: 116/69 (!) 112/58 118/75 124/77  Pulse:      Resp:      Temp:    98.2 F (36.8 C)  TempSrc:    Oral  SpO2:      Weight:      Height:        Exam: Gen: NAD SKIN sig jaundice EYES icteric sclera ENT: MMM CV: RRR Lungs: clear ant Abd: soft, mild distension Extr: trace leg edema Neuro: awake, confused  Assessment/Plan: 1.  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 Other possibility is bile- induced tubular injury/ ATN. Urine lytes c/w HRS, FeNa < 1%. Also decreased oral intake, NSAIDs and recent ARB use could have contributed. - is recovering GFR, good UOP, downtrending SCr.     - weaned off levo 7/10; Sp CRRT 7/9- 7/11  - treated with midodrine, octreotide, albumin for HRS   -  Give 25gm albumin today for LVP - removed Temp HD cath 7/17  2.  Acute decompensated alcoholic hepatitis: On steroids. Pentoxifylllne  3.  Hepatic encephalopathy:  On lactulose. Per GI and TRH  4.  Hypotension: Cont midodrine  Arita Miss  [facility]   Recent Labs  Lab 05/05/20 [phone] 05/05/20 0553 05/06/20 0500 05/07/20 0416  K 3.1*   < > 2.8* 3.9  BUN 76*   < > 67* 65*  CREATININE 3.00*   < > 2.19* 1.98*  CALCIUM 7.0*   < > 7.7* 7.8*  PHOS 4.4  --  3.2  --   HGB 10.9*   < > 10.8* 10.2*   < > = values in this interval not displayed.   Inpatient medications: . Chlorhexidine Gluconate Cloth  6 each Topical Q0600  . feeding supplement (PROSource TF)  45 mL Per Tube BID  . HYDROmorphone  2 mg Oral Q6H  . lactulose  20 g Oral TID  . mouth rinse  15 mL Mouth Rinse BID  . midodrine  15 mg Oral TID WC  . pantoprazole (PROTONIX) IV  40 mg Intravenous Q12H  . pentoxifylline  400 mg Oral Daily  . prednisoLONE  30 mg Oral Q breakfast   . rifaximin  550 mg Oral BID  . sodium chloride flush  10-40 mL Intracatheter Q12H  . thiamine  100 mg Oral Daily   . sodium chloride Stopped (04/30/20 1614)  . albumin human    . albumin human    . feeding supplement (VITAL 1.5 CAL) 1,000 mL (05/07/20 0042)   albuterol, alum & mag hydroxide-simeth, HYDROmorphone (DILAUDID) injection, ondansetron (ZOFRAN) IV, polyethylene glycol, prochlorperazine, simethicone, sodium chloride flush

## 2020-05-08 ENCOUNTER — Encounter (HOSPITAL_COMMUNITY): Payer: Self-pay | Admitting: Pulmonary Disease

## 2020-05-08 DIAGNOSIS — R748 Abnormal levels of other serum enzymes: Secondary | ICD-10-CM | POA: Diagnosis present

## 2020-05-08 DIAGNOSIS — Z66 Do not resuscitate: Secondary | ICD-10-CM

## 2020-05-08 DIAGNOSIS — R140 Abdominal distension (gaseous): Secondary | ICD-10-CM | POA: Diagnosis present

## 2020-05-08 DIAGNOSIS — B1921 Unspecified viral hepatitis C with hepatic coma: Secondary | ICD-10-CM

## 2020-05-08 DIAGNOSIS — F1020 Alcohol dependence, uncomplicated: Secondary | ICD-10-CM

## 2020-05-08 DIAGNOSIS — R188 Other ascites: Secondary | ICD-10-CM | POA: Diagnosis present

## 2020-05-08 DIAGNOSIS — G9340 Encephalopathy, unspecified: Secondary | ICD-10-CM

## 2020-05-08 LAB — BPAM FFP
Blood Product Expiration Date: 202107212359
ISSUE DATE / TIME: 202107182233
Unit Type and Rh: 600

## 2020-05-08 LAB — HEPATIC FUNCTION PANEL
ALT: 92 U/L — ABNORMAL HIGH (ref 0–44)
AST: 170 U/L — ABNORMAL HIGH (ref 15–41)
Albumin: 2.2 g/dL — ABNORMAL LOW (ref 3.5–5.0)
Alkaline Phosphatase: 129 U/L — ABNORMAL HIGH (ref 38–126)
Bilirubin, Direct: 20.5 mg/dL — ABNORMAL HIGH (ref 0.0–0.2)
Total Bilirubin: 31 mg/dL (ref 0.3–1.2)
Total Protein: 5.6 g/dL — ABNORMAL LOW (ref 6.5–8.1)

## 2020-05-08 LAB — CBC
HCT: 24.3 % — ABNORMAL LOW (ref 39.0–52.0)
Hemoglobin: 8.6 g/dL — ABNORMAL LOW (ref 13.0–17.0)
MCH: 37.6 pg — ABNORMAL HIGH (ref 26.0–34.0)
MCHC: 35.4 g/dL (ref 30.0–36.0)
MCV: 106.1 fL — ABNORMAL HIGH (ref 80.0–100.0)
Platelets: 131 10*3/uL — ABNORMAL LOW (ref 150–400)
RBC: 2.29 MIL/uL — ABNORMAL LOW (ref 4.22–5.81)
RDW: 22.5 % — ABNORMAL HIGH (ref 11.5–15.5)
WBC: 35.3 10*3/uL — ABNORMAL HIGH (ref 4.0–10.5)
nRBC: 0 % (ref 0.0–0.2)

## 2020-05-08 LAB — BASIC METABOLIC PANEL
Anion gap: 10 (ref 5–15)
BUN: 76 mg/dL — ABNORMAL HIGH (ref 6–20)
CO2: 19 mmol/L — ABNORMAL LOW (ref 22–32)
Calcium: 7.7 mg/dL — ABNORMAL LOW (ref 8.9–10.3)
Chloride: 108 mmol/L (ref 98–111)
Creatinine, Ser: 2.09 mg/dL — ABNORMAL HIGH (ref 0.61–1.24)
GFR calc Af Amer: 43 mL/min — ABNORMAL LOW (ref >60)
GFR calc non Af Amer: 37 mL/min — ABNORMAL LOW (ref >60)
Glucose, Bld: 138 mg/dL — ABNORMAL HIGH (ref 70–99)
Potassium: 4.2 mmol/L (ref 3.5–5.1)
Sodium: 137 mmol/L (ref 135–145)

## 2020-05-08 LAB — GLUCOSE, CAPILLARY
Glucose-Capillary: 138 mg/dL — ABNORMAL HIGH (ref 70–99)
Glucose-Capillary: 131 mg/dL — ABNORMAL HIGH (ref 70–99)
Glucose-Capillary: 132 mg/dL — ABNORMAL HIGH (ref 70–99)
Glucose-Capillary: 174 mg/dL — ABNORMAL HIGH (ref 70–99)
Glucose-Capillary: 179 mg/dL — ABNORMAL HIGH (ref 70–99)
Glucose-Capillary: 209 mg/dL — ABNORMAL HIGH (ref 70–99)
Glucose-Capillary: 175 mg/dL — ABNORMAL HIGH (ref 70–99)

## 2020-05-08 LAB — HCV RNA QUANT: HCV Quantitative: NOT DETECTED IU/mL (ref >50)

## 2020-05-08 LAB — CULTURE, BODY FLUID W GRAM STAIN -BOTTLE: Culture: NO GROWTH

## 2020-05-08 LAB — PREPARE FRESH FROZEN PLASMA

## 2020-05-08 LAB — HEMOGLOBIN AND HEMATOCRIT, BLOOD
HCT: 26.8 % — ABNORMAL LOW (ref 39.0–52.0)
HCT: 28.1 % — ABNORMAL LOW (ref 39.0–52.0)
Hemoglobin: 9.4 g/dL — ABNORMAL LOW (ref 13.0–17.0)
Hemoglobin: 10 g/dL — ABNORMAL LOW (ref 13.0–17.0)

## 2020-05-08 LAB — PATHOLOGIST SMEAR REVIEW

## 2020-05-08 MED ORDER — VITAL 1.5 CAL PO LIQD: 1000.0000 mL | ORAL | Status: DC

## 2020-05-08 MED ORDER — INSULIN ASPART 100 UNIT/ML ~~LOC~~ SOLN: 0.0000 [IU] | Freq: Three times a day (TID) | SUBCUTANEOUS | Status: DC

## 2020-05-08 NOTE — Progress Notes (Signed)
[facility] Progress Note  Subjective:    3.8L paracentesis yesterday  Mother at bedside  Appetite and mentation much better today  No complaints, feels better since paracentesis  350cc urine output (questionable accuracy)   Vitals:   05/08/20 0000 05/08/20 0300 05/08/20 0759 05/08/20 1156  BP: 102/60 106/65 114/74 98/68  Pulse: 69 71 74 77  Resp: 16 16 19 20   Temp: 97.8 F (36.6 C) 98.3 F (36.8 C) 97.9 F (36.6 C) 97.8 F (36.6 C)  TempSrc: Oral Oral Oral Oral  SpO2: 95% 96% 98% 98%  Weight:      Height:        Exam: Gen: NAD SKIN: jaundiced EYES icteric sclera ENT: MMM CV: RRR Lungs: clear ant, normal wob, unlabored Abd: soft, mild distension Extr: trace edema bl le's Neuro: awake     Recent Labs  Lab 05/05/20 0553 05/05/20 0553 05/06/20 0500 05/06/20 0500 05/07/20 0416 05/07/20 1815 05/08/20 0347 05/08/20 0943  K 3.1*   < > 2.8*   < > 3.9  --  4.2  --   BUN 76*   < > 67*   < > 65*  --  76*  --   CREATININE 3.00*   < > 2.19*   < > 1.98*  --  2.09*  --   CALCIUM 7.0*   < > 7.7*   < > 7.8*  --  7.7*  --   PHOS 4.4  --  3.2  --   --   --   --   --   HGB 10.9*   < > 10.8*   < > 10.2*   < > 8.6* 9.4*   < > = values in this interval not displayed.   Inpatient medications: . Chlorhexidine Gluconate Cloth  6 each Topical Q0600  . feeding supplement (PROSource TF)  45 mL Per Tube BID  . lactulose  20 g Oral TID  . LORazepam  0.25 mg Oral Q8H  . mouth rinse  15 mL Mouth Rinse BID  . midodrine  15 mg Oral TID WC  . pantoprazole (PROTONIX) IV  40 mg Intravenous Q12H  . prednisoLONE  30 mg Oral Q breakfast  . rifaximin  550 mg Oral BID  . sodium chloride flush  10-40 mL Intracatheter Q12H  . thiamine  100 mg Oral Daily   . sodium chloride Stopped (04/30/20 1614)  . albumin human    . ceFEPime (MAXIPIME) IV 2 g (05/08/20 0602)  . feeding supplement (VITAL 1.5 CAL) 1,000 mL (05/08/20 1129)  . octreotide  (SANDOSTATIN)    IV infusion  50 mcg/hr (05/08/20 0555)   albuterol, alum & mag hydroxide-simeth, HYDROmorphone (DILAUDID) injection, ondansetron (ZOFRAN) IV, polyethylene glycol, prochlorperazine, simethicone, sodium chloride flush   Assessment/Plan: 1.  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 Other possibility is bile- induced tubular injury/ ATN. Urine lytes c/w HRS, FeNa < 1%. Also decreased oral intake, NSAIDs and recent ARB use could have contributed. - is recovering GFR, stable serum cr  - weaned off levo 7/10; Sp CRRT 7/9- 7/11  - treated with midodrine, octreotide, albumin for HRS   -  Albumin with LVP's if needed in the future - removed Temp HD cath 7/17 -no indication for renal replacement therapy -if bicarb downtrends or remains <22 then recommend starting nahco3 650mg  bid to start with  2.  Acute decompensated alcoholic hepatitis: On steroids. Pentoxifylllne  3.  Hepatic encephalopathy:  On lactulose. Per GI and TRH  4.  Hypotension: Cont midodrine 15mg  tid  , MD [facility]

## 2020-05-08 NOTE — Progress Notes (Signed)
The patient starting having melena last night.  He had another bloody stool this morning.  Octreotide was started yesterday evening.  Was already on PPI.  During this time, there has been no overt hemodynamic instability, such as tachycardia.  His hemoglobin has dropped approximately 1.5 g from yesterday morning's baseline, and is currently 9.6.  With this, there has been a slight rise in BUN, from 65 to 76 although that has been accompanied by a correlative slight rise in creatinine.  On the plus side, the patient's bilirubin has dropped from 37 to 31.  INR 2.0 which is slightly improved, platelets 131,000 this morning which is actually better than typical for the patient.  The patient continues to be very thirsty but is free of abdominal pain.  On exam, vitals are unremarkable, he remains deeply jaundiced, but is awake, alert, and seemingly coherent although formal mental status testing was not performed.  Feeding tube remains in place.  The abdomen is quite distended, equal to about 8 months of pregnancy, but is soft and nontender.  Heart normal, chest clear anteriorly.  Impression:   1.  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  Alcoholic hepatitis with improving bilirubin  4.  Hepatitis C antibody positivity  5.  Resolving renal dysfunction,? Type I hepatorenal syndrome  6. Altered mental status, presumably hepatic encephalopathy, improving  7. Abdominal pain, presumably related to ascites and/or SBP, improved after paracentesis  8.  Elevated cell count on yesterday's paracentesis fluid (markedly increased compared to 4 days ago), total neutrophil count approximately 700 suggestive of SBP--on Cefepime per Pharmacy  Plan:  1.  Continue octreotide 2.  Monitor CBC 3.  There is no time on the endoscopy schedule today for the patient to have upper endoscopy.  I have discussed the nature, purpose, and risks of  the procedure with the patient and his mother who is at the bedside, and they are agreeable to proceed.  We will plan for it first thing tomorrow morning, which I do feel is acceptable since the patient is on appropriate medical therapy and is not showing signs of clinical instability.  However, I explained that, if he should start having active bleeding, arrangements could be made for an emergent endoscopy  Florencia Reasons, M.D. Pager [phone] If no answer or after 5 PM call [phone]

## 2020-05-08 NOTE — Progress Notes (Signed)
PROGRESS NOTE    Alan Becker  ZOX:[id number] DOB: 04/05/1976 DOA: 04/27/2020 PCP: [name], MD   Brief Narrative: 44 year old with past medical history significant for alcohol abuse who was brought to the ED by family who noticed that he has been very confused and having skin color changes.  He has a history of depression and has been drinking heavily for the last 8 years since his divorce.  His mother gave most of the history because, patient has been confused.  Mother reporter that patient has been experiencing abdominal bloating and pain, fatigue and malaise for the last 3 to 4 weeks.  Work-up in the ED consistent with acute renal failure creatinine of 8.5, albumin 1.9, bilirubin of 23, AST elevated and white count of 15.  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  On 7/9 he was a started on CRRT, GI renal following, underwent paracentesis on 7/8 with 30 cc of dark yellow fluid which was negative for SBP.  His care was transferred to [facility] service on 7/13.  He underwent repeated paracentesis on 7/14 yielding 3 L of fluid.   Patient develops worsening encephalopathy on 7/15, not following command, more lethargic. CCM consulted for further evaluation and concern for decompensation. Plan to transfer to progressive care unit, support care, NG tube placement for medications.     Assessment & Plan:   Active Problems:   Depression with anxiety   Alcohol use disorder, severe, dependence (HCC)   Hepatorenal syndrome (HCC)   AKI (acute kidney injury) (HCC)   Palliative care encounter   Acute liver failure without hepatic coma   Encephalopathy acute   DNR (do not resuscitate)   Chronic hepatitis C with hepatic coma (HCC)  1-Acute renal failure, hepatorenal syndrome: Nephrology consulted and following. Patient was a started on midodrine/octreotide/albumin No acute indication for dialysis, plan to monitor renal function and urine output for  the next several days. Urine out put yesterday 776. Cr at 2 today, continue to monitor renal function.  HD catheter removed 7/17.  Acute decompensated alcoholic liver cirrhosis -[facility] consulted.  Dr. Bosie Clos saw the patient earlier during this admission. -Patient was a started on prednisone, his discriminate function was 74 -He was deemed not a candidate for liver transplant given recent alcohol use -Symptomatic ascites, patient underwent repeat paracentesis on 7/14 follow fluid results: culture no growth to date. Pathology report pending, WBC 196. On empiric antibiotics.  -Added ceftriaxone for SBP prophylaxis due to concern for GI bleed -Worsening mental status, ammonium up, plan for rifaximin and lactulose pre NG tube.  -CCM consulted. Transfer to progressive care.  -he was alert, able to answer few questions today.  -he has been able to take rifaximin, discontinue lactulose enema.  -Holding Pentoxifylline due to associated risk for bleeding.  -Underwent repeated paracentesis 7/18 due to abdominal pain and distension. Ascites fluids concerning with SBP. Received albumin after paracentesis.  -Viral hepatitis panel ordered. Hepatitis C antibody positive, RNA pending.   Acute SBP;  Abdominal fluid from 7/18 ANC 750.  Antibiotics change to cefepime.  Follow culture. No growth to date.   Acute hypoxic, respiratory failure; increase oxygen supply.  Chest x ray, Hypoinflation.   Respiratory alkalosis; support care   Leukocytosis: ? SBP, steroids.  Blood cultures; pending.  ID consulted. Antibiotics change to cefepime.  Trending down today, on cefepime.   Hyponatremia; might be related to renal failure, liver failure.   GI, Bleed, melena; concern for portal  gastropathy;  Appreciate Dr Matthias Hughs help.  Monitor hb, this am at 9.  IV protonix. Colonoscopy tomorrow.  Received one unit FFP.   Long-term alcohol abuse: Counseling provided Continue with thiamine.  Abnormal liver  lesion: CT abdomen on 7/8 noted questionable focal hypoattenuation mass in the liver not well characterized, recommend a dedicated liver protocol CT or MRI when more stable  Major depression: Not appropriate for psychotropic med in the setting of decompensated cirrhosis  Hypokalemia; replete IV> and orally     Nutrition Problem: Inadequate oral intake Etiology: poor appetite    Signs/Symptoms: per patient/family report    Interventions: Ensure Enlive (each supplement provides 350kcal and 20 grams of protein), Education, Prostat  Estimated body mass index is 27.45 kg/m as calculated from the following:   Height as of this encounter: 5\' 8"  (1.727 m).   Weight as of this encounter: 81.9 kg.   DVT prophylaxis: SCDs, hold heparin Code Status: Full code Family Communication: plan for palliative care consulted. Patient with poor prognosis. Mother 7/18 Disposition Plan:  Status is: Inpatient  Remains inpatient appropriate because:IV treatments appropriate due to intensity of illness or inability to take PO   Dispo: The patient is from: Home              Anticipated d/c is to: To be determined              Anticipated d/c date is: 3 days              Patient currently is not medically stable to d/c.        Consultants:   GI, [facility]  CCM  Procedures:     Antimicrobials:    Subjective: He is alert, he is more comfortable. Had another maroon BM this am.  Abdominal pain much improved after paracentesis from yesterday.   Objective: Vitals:   05/07/20 2300 05/08/20 0000 05/08/20 0300 05/08/20 0759  BP: 102/66 102/60 106/65 114/74  Pulse: 71 69 71 74  Resp: 17 16 16 19   Temp: 97.8 F (36.6 C) 97.8 F (36.6 C) 98.3 F (36.8 C) 97.9 F (36.6 C)  TempSrc: Oral Oral Oral Oral  SpO2: 95% 95% 96% 98%  Weight:      Height:        Intake/Output Summary (Last 24 hours) at 05/08/2020 0815 Last data filed at 05/08/2020 0602 Gross per 24 hour  Intake 685.39  ml  Output 350 ml  Net 335.39 ml   Filed Weights   05/03/20 2029 05/05/20 0500 05/06/20 0500  Weight: 77.6 kg 83.4 kg 81.9 kg    Examination:  General exam: alert, NAD, icteric  Respiratory system: normal respiratory effort, CTA Cardiovascular system: S 1, S 2 RRR Gastrointestinal system: BS present, soft, nt, distended  Central nervous system: Alert Extremities: no edema   Data Reviewed: I have personally reviewed following labs and imaging studies  CBC: Recent Labs  Lab 05/05/20 0553 05/05/20 0553 05/06/20 0500 05/07/20 0416 05/07/20 1815 05/07/20 2227 05/08/20 0347  WBC 33.1*  --  34.6* 38.9* 40.1*  --  35.3*  HGB 10.9*   < > 10.8* 10.2* 9.6* 8.5* 8.6*  HCT 30.3*   < > 30.3* 28.7* 27.5* 24.1* 24.3*  MCV 103.4*  --  104.5* 104.7* 105.4*  --  106.1*  PLT 81*  --  88* 108* 125*  --  131*   < > = values in this interval not displayed.   Basic Metabolic Panel: Recent Labs  Lab  05/03/20 0413 05/03/20 0413 05/04/20 0406 05/04/20 1650 05/05/20 0553 05/06/20 0500 05/07/20 0416 05/08/20 0347  NA 128*   < > 132*  --  135 142 140 137  K 3.8   < > 3.6  --  3.1* 2.8* 3.9 4.2  CL 98   < > 99  --  106 110 111 108  CO2 20*   < > 21*  --  19* 19* 18* 19*  GLUCOSE 126*   < > 121*  --  138* 138* 165* 138*  BUN 58*   < > 66*  --  76* 67* 65* 76*  CREATININE 3.80*   < > 3.43*  --  3.00* 2.19* 1.98* 2.09*  CALCIUM 6.3*   < > 6.8*  --  7.0* 7.7* 7.8* 7.7*  MG  --   --   --  2.4 2.4 2.3  --   --   PHOS 3.0  --  3.3 3.7 4.4 3.2  --   --    < > = values in this interval not displayed.   GFR: Estimated Creatinine Clearance: 43.6 mL/min (A) (by C-G formula based on SCr of 2.09 mg/dL (H)). Liver Function Tests: Recent Labs  Lab 05/04/20 0406 05/05/20 0553 05/06/20 0500 05/07/20 0416 05/08/20 0347  AST 151* 165* 139* 160* 170*  ALT 66* 79* 75* 86* 92*  ALKPHOS 142* 153* 168* 161* 129*  BILITOT 31.3* 34.5* 34.6* 36.8* 31.0*  PROT 5.7* 5.8* 6.0* 6.1* 5.6*  ALBUMIN 2.4*  2.3* 2.2* 2.0* 2.2*   No results for input(s): LIPASE, AMYLASE in the last 168 hours. Recent Labs  Lab 05/03/20 2016 05/05/20 0553 05/06/20 0500  AMMONIA 111* 77* 62*   Coagulation Profile: Recent Labs  Lab 05/03/20 2016 05/04/20 0406 05/07/20 1815  INR 2.3* 2.2* 2.0*   Cardiac Enzymes: No results for input(s): CKTOTAL, CKMB, CKMBINDEX, TROPONINI in the last 168 hours. BNP (last 3 results) No results for input(s): PROBNP in the last 8760 hours. HbA1C: No results for input(s): HGBA1C in the last 72 hours. CBG: Recent Labs  Lab 05/07/20 1628 05/07/20 1946 05/07/20 2338 05/08/20 0402 05/08/20 0758  GLUCAP 124* 134* 124* 131* 132*   Lipid Profile: No results for input(s): CHOL, HDL, LDLCALC, TRIG, CHOLHDL, LDLDIRECT in the last 72 hours. Thyroid Function Tests: No results for input(s): TSH, T4TOTAL, FREET4, T3FREE, THYROIDAB in the last 72 hours. Anemia Panel: No results for input(s): VITAMINB12, FOLATE, FERRITIN, TIBC, IRON, RETICCTPCT in the last 72 hours. Sepsis Labs: No results for input(s): PROCALCITON, LATICACIDVEN in the last 168 hours.  Recent Results (from the past 240 hour(s))  MRSA PCR Screening     Status: None   Collection Time: 04/30/20  8:35 PM   Specimen: Nasopharyngeal  Result Value Ref Range Status   MRSA by PCR NEGATIVE NEGATIVE Final    Comment:        The GeneXpert MRSA Assay (FDA approved for NASAL specimens only), is one component of a comprehensive MRSA colonization surveillance program. It is not intended to diagnose MRSA infection nor to guide or monitor treatment for MRSA infections. Performed at [facility], [address] [place], [place] [address]   Gram stain     Status: None   Collection Time: 05/03/20  1:10 PM   Specimen: Abdomen; Peritoneal Fluid  Result Value Ref Range Status   Specimen Description ABDOMEN  Final   Special Requests PERITONEAL FLUID  Final   Gram Stain   Final    WBC  PRESENT,BOTH PMN AND  MONONUCLEAR NO ORGANISMS SEEN CYTOSPIN SMEAR Performed at [facility], [address], [place], [place] [address]    Report Status 05/03/2020 FINAL  Final  Culture, body fluid-bottle     Status: None (Preliminary result)   Collection Time: 05/03/20  1:10 PM   Specimen: Peritoneal Washings  Result Value Ref Range Status   Specimen Description PERITONEAL FLUID  Final   Special Requests ABDOMEN  Final   Culture   Final    NO GROWTH 4 DAYS Performed at [facility], [address] [place], [place] [address]    Report Status PENDING  Incomplete  Urine Culture     Status: None   Collection Time: 05/04/20  5:23 PM   Specimen: Urine, Random  Result Value Ref Range Status   Specimen Description URINE, RANDOM  Final   Special Requests NONE  Final   Culture   Final    NO GROWTH Performed at [facility], [address], [place], [place] [address]    Report Status 05/05/2020 FINAL  Final  Culture, blood (routine x 2)     Status: None (Preliminary result)   Collection Time: 05/05/20  7:53 AM   Specimen: BLOOD  Result Value Ref Range Status   Specimen Description BLOOD LEFT ANTECUBITAL  Final   Special Requests   Final    BOTTLES DRAWN AEROBIC AND ANAEROBIC Blood Culture results may not be optimal due to an inadequate volume of blood received in culture bottles   Culture   Final    NO GROWTH 2 DAYS Performed at [facility], [address], [place], [place] [address]    Report Status PENDING  Incomplete  Culture, blood (routine x 2)     Status: None (Preliminary result)   Collection Time: 05/05/20  7:57 AM   Specimen: BLOOD LEFT HAND  Result Value Ref Range Status   Specimen Description BLOOD LEFT HAND  Final   Special Requests   Final    BOTTLES DRAWN AEROBIC AND ANAEROBIC Blood Culture adequate volume   Culture   Final    NO GROWTH 2 DAYS Performed at [facility], [address], [place], [place] [address]    Report Status PENDING  Incomplete    Gram stain     Status: None   Collection Time: 05/07/20 10:27 AM   Specimen: Abdomen; Peritoneal Fluid  Result Value Ref Range Status   Specimen Description PERITONEAL FLUID  Final   Special Requests NONE  Final   Gram Stain   Final    WBC PRESENT, PREDOMINANTLY PMN NO ORGANISMS SEEN CYTOSPIN SMEAR Performed at [facility], [address] [place], [place] [address]    Report Status 05/07/2020 FINAL  Final         Radiology Studies: US Paracentesis  Result Date: 05/07/2020 INDICATION: Ascites EXAM: ULTRASOUND GUIDED LLQ PARACENTESIS MEDICATIONS: 10 cc 1% lidocaine COMPLICATIONS: None immediate. PROCEDURE: Informed written consent was obtained from the patient after a discussion of the risks, benefits and alternatives to treatment. A timeout was performed prior to the initiation of the procedure. Initial ultrasound scanning demonstrates a large amount of ascites within the left lower abdominal quadrant. The left lower abdomen was prepped and draped in the usual sterile fashion. 1% lidocaine was used for local anesthesia. Following this, a 72 G Yueh catheter was introduced. An ultrasound image was saved for documentation purposes. The paracentesis was performed. The catheter was removed  and a dressing was applied. The patient tolerated the procedure well without immediate post procedural complication. Patient received post-procedure intravenous albumin; see nursing notes for details. FINDINGS: A total of approximately 3.8 liters of bright yellow fluid was removed. Samples were sent to the laboratory as requested by the clinical team. IMPRESSION: Successful ultrasound-guided paracentesis yielding 3.8 L liters of peritoneal fluid. Read by Robet LeuPamela A [facility] Hospital[facility] Electronically Signed   By: Gilmer MorJaime  Wagner D.O.   On: 05/07/2020 10:27        Scheduled Meds: . Chlorhexidine Gluconate Cloth  6 each Topical Q0600  . feeding supplement (PROSource TF)  45 mL Per Tube BID  . lactulose  20 g  Oral TID  . LORazepam  0.25 mg Oral Q8H  . mouth rinse  15 mL Mouth Rinse BID  . midodrine  15 mg Oral TID WC  . pantoprazole (PROTONIX) IV  40 mg Intravenous Q12H  . prednisoLONE  30 mg Oral Q breakfast  . rifaximin  550 mg Oral BID  . sodium chloride flush  10-40 mL Intracatheter Q12H  . thiamine  100 mg Oral Daily   Continuous Infusions: . sodium chloride Stopped (04/30/20 1614)  . albumin human    . ceFEPime (MAXIPIME) IV 2 g (05/08/20 0602)  . feeding supplement (VITAL 1.5 CAL) 1,000 mL (05/07/20 0042)  . octreotide  (SANDOSTATIN)    IV infusion 50 mcg/hr (05/08/20 0555)     LOS: 11 days    Time spent: 35 minutes.     Alba CoryBelkys A Kennith Morss, MD [facility]   If 7PM-7AM, please contact night-coverage [email or web address]  05/08/2020, 8:15 AM

## 2020-05-08 NOTE — Anesthesia Preprocedure Evaluation (Addendum)
Anesthesia Evaluation  Patient identified by MRN, date of birth, ID band Patient awake    Reviewed: Allergy & Precautions, NPO status , Patient's Chart, lab work & pertinent test results  Airway Mallampati: III  TM Distance: >3 FB Neck ROM: Full    Dental no notable dental hx. (+) Teeth Intact   Pulmonary Current Smoker and Patient abstained from smoking.,    Pulmonary exam normal breath sounds clear to auscultation       Cardiovascular hypertension, Pt. on medications + Orthopnea  Normal cardiovascular exam Rhythm:Regular Rate:Normal  EKG 04/29/20 NSR, non specific ST-T wave abnormalities, prolonged QTc   Neuro/Psych PSYCHIATRIC DISORDERS Anxiety Depression Hepatic encephalopathy    GI/Hepatic (+) Cirrhosis   ascites  substance abuse  alcohol use, Hepatitis -, CHepatorenal syndrome GI Bleeding   Endo/Other    Renal/GU Renal InsufficiencyRenal disease  negative genitourinary   Musculoskeletal   Abdominal Ascites   Peds  Hematology  (+) anemia ,   Anesthesia Other Findings Skin markedly icteric  Reproductive/Obstetrics                           Anesthesia Physical Anesthesia Plan  ASA: IV  Anesthesia Plan: MAC   Post-op Pain Management:    Induction: Intravenous  PONV Risk Score and Plan: 0 and Propofol infusion  Airway Management Planned: Natural Airway and Nasal Cannula  Additional Equipment:   Intra-op Plan:   Post-operative Plan:   Informed Consent: I have reviewed the patients History and Physical, chart, labs and discussed the procedure including the risks, benefits and alternatives for the proposed anesthesia with the patient or authorized representative who has indicated his/her understanding and acceptance.   Patient has DNR.  Discussed DNR with patient and Suspend DNR.   Dental advisory given  Plan Discussed with: CRNA and Surgeon  Anesthesia Plan Comments:         Anesthesia Quick Evaluation

## 2020-05-08 NOTE — Progress Notes (Signed)
[facility]  Date of Admission:  04/27/2020      Total days of antibiotics 6  Day 1 cefepime           ASSESSMENT: Alan Becker is a 44 y.o. male with leukocytosis. Ceftriaxone was stopped 7/18 however repeat paracentesis cell count revealed nearly 4-fold increase in WBC with ~65% neutrophils suggesting bacterial peritonitis. Culture so far negative. Will put him on Cefepime while we wait to see what grows and repeat cell count. On exam he is still distended and a little tender. Seems persistently encephalopathic, but overall improved from previous documentation from other providers - Rifaxamin added to lactulose   Hepatitis C antibody (+) with RNA pending to determine chronic infection. Counseled to quit alcohol to help reduce inflammation and damage to liver overall. No findings on CT or abdominal ultrasound that suggest tumor/mass, he would probably benefit from MR of the liver at some point if he is indeed found to have (+) chronic infection to more exclusively rule out small lesions.   Palliative medicine following his case. He will undergo repeat EGD tomorrow for ongoing melana.    PLAN: 1. Start Cefepime 2. Follow peritoneal cultures  3. Follow for repeat Cell count to monitor suspected SPB 4. Follow Hep C RNA results drawn this weekend    Active Problems:   Depression with anxiety   Alcohol use disorder, severe, dependence (HCC)   Hepatorenal syndrome (HCC)   AKI (acute kidney injury) (HCC)   Palliative care encounter   Acute liver failure without hepatic coma   Encephalopathy acute   DNR (do not resuscitate)   Chronic hepatitis C with hepatic coma (HCC)   . Chlorhexidine Gluconate Cloth  6 each Topical Q0600  . feeding supplement (PROSource TF)  45 mL Per Tube BID  . lactulose  20 g Oral TID  . LORazepam  0.25 mg Oral Q8H  . mouth rinse  15 mL Mouth Rinse BID  . midodrine  15 mg Oral TID WC  . pantoprazole (PROTONIX) IV  40  mg Intravenous Q12H  . prednisoLONE  30 mg Oral Q breakfast  . rifaximin  550 mg Oral BID  . sodium chloride flush  10-40 mL Intracatheter Q12H  . thiamine  100 mg Oral Daily    SUBJECTIVE: Sister in the room - permission given to discuss health with her at bedside.  Feeling a little better today - mostly tired. Never had any problems with ascites requiring paracentesis in the past per his report. Drinks alcohol heavily - willing to change this and stop. In consideration of (+) Hep C Ab - no history of IVDU, not likely sexually acquired per his description of previous relationships.    Review of Systems: Review of Systems  Constitutional: Positive for malaise/fatigue. Negative for chills and fever.  Respiratory: Negative for cough.   Cardiovascular: Negative for chest pain.  Gastrointestinal: Positive for abdominal pain and melena.  Neurological: Positive for weakness.     Allergies  Allergen Reactions  . Penicillins Other (See Comments)    Did it involve swelling of the face/tongue/throat, SOB, or low BP? No Did it involve sudden or severe rash/hives, skin peeling, or any reaction on the inside of your mouth or nose? Rash Did you need to seek medical attention at a hospital or doctor's office? Yes When did it last happen?Child If all above answers are "NO", may proceed with cephalosporin use.  OBJECTIVE: Vitals:   05/08/20 0000 05/08/20 0300 05/08/20 0759 05/08/20 1156  BP: 102/60 106/65 114/74 98/68  Pulse: 69 71 74 77  Resp: 16 16 19 20   Temp: 97.8 F (36.6 C) 98.3 F (36.8 C) 97.9 F (36.6 C) 97.8 F (36.6 C)  TempSrc: Oral Oral Oral Oral  SpO2: 95% 96% 98% 98%  Weight:      Height:       Body mass index is 27.45 kg/m.  Physical Exam Constitutional:      Comments: Sitting upright in bed no distress. Chronically ill appearing, older than stated age.   Cardiovascular:     Rate and Rhythm: Normal rate and regular rhythm.  Pulmonary:     Effort:  Pulmonary effort is normal.     Breath sounds: Normal breath sounds.  Abdominal:     General: There is distension.     Tenderness: There is generalized abdominal tenderness.  Skin:    Capillary Refill: Capillary refill takes less than 2 seconds.     Coloration: Skin is jaundiced.  Neurological:     Mental Status: He is alert and oriented to person, place, and time.     Lab Results Lab Results  Component Value Date   WBC 35.3 (H) 05/08/2020   HGB 9.4 (L) 05/08/2020   HCT 26.8 (L) 05/08/2020   MCV 106.1 (H) 05/08/2020   PLT 131 (L) 05/08/2020    Lab Results  Component Value Date   CREATININE 2.09 (H) 05/08/2020   BUN 76 (H) 05/08/2020   NA 137 05/08/2020   K 4.2 05/08/2020   CL 108 05/08/2020   CO2 19 (L) 05/08/2020    Lab Results  Component Value Date   ALT 92 (H) 05/08/2020   AST 170 (H) 05/08/2020   ALKPHOS 129 (H) 05/08/2020   BILITOT 31.0 (HH) 05/08/2020     Microbiology: Recent Results (from the past 240 hour(s))  MRSA PCR Screening     Status: None   Collection Time: 04/30/20  8:35 PM   Specimen: Nasopharyngeal  Result Value Ref Range Status   MRSA by PCR NEGATIVE NEGATIVE Final    Comment:        The GeneXpert MRSA Assay (FDA approved for NASAL specimens only), is one component of a comprehensive MRSA colonization surveillance program. It is not intended to diagnose MRSA infection nor to guide or monitor treatment for MRSA infections. Performed at [facility], [address], [place], [place]   Gram stain     Status: None   Collection Time: 05/03/20  1:10 PM   Specimen: Abdomen; Peritoneal Fluid  Result Value Ref Range Status   Specimen Description ABDOMEN  Final   Special Requests PERITONEAL FLUID  Final   Gram Stain   Final    WBC PRESENT,BOTH PMN AND MONONUCLEAR NO ORGANISMS SEEN CYTOSPIN SMEAR Performed at [facility], [address], [place], [place]    Report Status 05/03/2020 FINAL  Final  Culture,  body fluid-bottle     Status: None   Collection Time: 05/03/20  1:10 PM   Specimen: Peritoneal Washings  Result Value Ref Range Status   Specimen Description PERITONEAL FLUID  Final   Special Requests ABDOMEN  Final   Culture   Final    NO GROWTH 5 DAYS Performed at [facility], [address], [place], [place]    Report Status 05/08/2020 FINAL  Final  Urine Culture     Status:  None   Collection Time: 05/04/20  5:23 PM   Specimen: Urine, Clean Catch  Result Value Ref Range Status   Specimen Description URINE, RANDOM  Final   Special Requests NONE  Final   Culture   Final    NO GROWTH Performed at [facility], [address], [place], [place] [address]    Report Status 05/05/2020 FINAL  Final  Culture, blood (routine x 2)     Status: None (Preliminary result)   Collection Time: 05/05/20  7:53 AM   Specimen: BLOOD  Result Value Ref Range Status   Specimen Description BLOOD LEFT ANTECUBITAL  Final   Special Requests   Final    BOTTLES DRAWN AEROBIC AND ANAEROBIC Blood Culture results may not be optimal due to an inadequate volume of blood received in culture bottles   Culture   Final    NO GROWTH 3 DAYS Performed at [facility], [address], [place], [place] [address]    Report Status PENDING  Incomplete  Culture, blood (routine x 2)     Status: None (Preliminary result)   Collection Time: 05/05/20  7:57 AM   Specimen: BLOOD LEFT HAND  Result Value Ref Range Status   Specimen Description BLOOD LEFT HAND  Final   Special Requests   Final    BOTTLES DRAWN AEROBIC AND ANAEROBIC Blood Culture adequate volume   Culture   Final    NO GROWTH 3 DAYS Performed at [facility], [address], [place], [place] [address]    Report Status PENDING  Incomplete  Gram stain     Status: None   Collection Time: 05/07/20 10:27 AM   Specimen: Abdomen; Peritoneal Fluid  Result Value Ref Range Status   Specimen Description PERITONEAL FLUID  Final    Special Requests NONE  Final   Gram Stain   Final    WBC PRESENT, PREDOMINANTLY PMN NO ORGANISMS SEEN CYTOSPIN SMEAR Performed at [facility], [address], [place], [place] [address]    Report Status 05/07/2020 FINAL  Final  Culture, body fluid-bottle     Status: None (Preliminary result)   Collection Time: 05/07/20 10:27 AM   Specimen: Peritoneal Washings  Result Value Ref Range Status   Specimen Description PERITONEAL FLUID  Final   Special Requests NONE  Final   Culture   Final    NO GROWTH 1 DAY Performed at [facility], [address], [place], [place] [address]    Report Status PENDING  Incomplete    Rexene Alberts, MSN, NP-C [facility]  [email or web address] Pager: [phone] Office: [phone] [facility] Main Line: [phone]

## 2020-05-08 NOTE — Progress Notes (Signed)
Daily Progress Note   Patient Name: Alan Becker       Date: 05/08/2020 DOB: 10-16-1976  Age: 44 y.o. MRN#: [id number] Attending Physician: Alba Cory, MD Primary Care Physician: [name], MD Admit Date: 04/27/2020  Reason for Consultation/Follow-up:  To discuss complex medical decision making related to patient's goals of care  Subjective: Spoke with mother at bedside.  Patient is sleeping soundly.  Mindi Junker and I discussed the improvement in his labs and clinical presentation.  I explained that I'm encouraged by this, but cautioned that he still has a poor longer term prognosis.   I asked Mindi Junker to take it day by day with [place].  We talked briefly about his low level bleeding I explained that this is not uncommon in patients with liver failure.    She mentioned that Donnald really wants to see his daughter Dorita Fray.   I mentioned that Masayoshi will either continue to improve or he will not.  If he does not then we should consider sending him home with hospice services or utilizing hospice facility that way he and Dorita Fray will be able to spend time together.   Mindi Junker stated that Jearl had a much better night last night.  He has not been groaning in pain.  She felt that Dr. Donavan Burnet suggestion of low dose ativan was the perfect touch.   Assessment: Some improvement in labs and clinical presentation.  Symptoms of pain / anxiety appear better controlled with ativan.   Patient Profile/HPI:  44 y.o. male  with past medical history of alcohol abuse, severe depression, HTN who was admitted on 04/27/2020 with confusion, weakness and changes in skin color. Per his mother the patient was a heavy drinker for the past 8 years but had stopped drinking 3 - 4 weeks ago.  Per admission H&P:  Work-up in  the ED consistent with acute renal failure creatinine of 8.5, albumin 1.9, bilirubin of 23, AST elevated and white count of 15. 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 On 7/9 he was a started on CRRT, GI renal following, underwent paracentesis on 7/8 with 30 cc of dark yellow fluid which was negative for SBP. He underwent repeated paracentesis on 7/14 yielding 3 L of fluid.  Patient developed worsening encephalopathy on 7/15, not following  commands, more lethargic. CCM consulted for further evaluation and concern for decompensation. Plan to transfer to progressive care unit, support care, NG tube placement for medications.     Length of Stay: 11   Vital Signs: BP 98/68 (BP Location: Right Arm)   Pulse 77   Temp 97.8 F (36.6 C) (Oral)   Resp 20   Ht 5\' 8"  (1.727 m)   Wt 81.9 kg   SpO2 98%   BMI 27.45 kg/m  SpO2: SpO2: 98 % O2 Device: O2 Device: Room Air O2 Flow Rate: O2 Flow Rate (L/min): 3 L/min       Palliative Assessment/Data: 20%     Palliative Care Plan    Recommendations/Plan:  Continue current care.  If he continues to bleed I wonder if vitamin K would be helpful.  PMT will continue to follow intermittently to support patient and family.  Will determine whether Palliative or Hospice is more appropriate as we near discharge.  Code Status:  DNR  Prognosis:  Difficult to determine.  He is at high risk for acute decline and death.  If he improves he is certainly at high risk for re-hospitalization.  Discharge Planning:  To Be Determined  Care plan was discussed with Mother  Thank you for allowing the Palliative Medicine Team to assist in the care of this patient.  Total time spent:  15 min.     Greater than 50%  of this time was spent counseling and coordinating care related to the above assessment and plan.  , PA-C Palliative Medicine  Please contact Palliative MedicineTeam phone  at [phone] for questions and concerns between 7 am - 7 pm.   Please see AMION for individual provider pager numbers.

## 2020-05-08 NOTE — Progress Notes (Signed)
This chaplain attempted PMT referral for spiritual care.  The chaplain checked in with the Pt. RN-Gina. The Pt. was sleeping at the time of the visit. The chaplain will F/U with spiritual care as needed.

## 2020-05-09 ENCOUNTER — Inpatient Hospital Stay (HOSPITAL_COMMUNITY): Payer: Medicaid Other | Admitting: Anesthesiology

## 2020-05-09 ENCOUNTER — Encounter (HOSPITAL_COMMUNITY)
Admission: EM | Disposition: A | Discharge status: Hospice | Payer: Self-pay | Source: Home / Self Care | Attending: Internal Medicine

## 2020-05-09 ENCOUNTER — Encounter (HOSPITAL_COMMUNITY): Payer: Self-pay | Admitting: Pulmonary Disease

## 2020-05-09 HISTORY — PX: ESOPHAGOGASTRODUODENOSCOPY (EGD) WITH PROPOFOL: SHX5813

## 2020-05-09 LAB — GLUCOSE, CAPILLARY
Glucose-Capillary: 115 mg/dL — ABNORMAL HIGH (ref 70–99)
Glucose-Capillary: 118 mg/dL — ABNORMAL HIGH (ref 70–99)
Glucose-Capillary: 130 mg/dL — ABNORMAL HIGH (ref 70–99)
Glucose-Capillary: 134 mg/dL — ABNORMAL HIGH (ref 70–99)
Glucose-Capillary: 143 mg/dL — ABNORMAL HIGH (ref 70–99)

## 2020-05-09 LAB — CBC
HCT: 27.8 % — ABNORMAL LOW (ref 39.0–52.0)
Hemoglobin: 9.8 g/dL — ABNORMAL LOW (ref 13.0–17.0)
MCH: 37.8 pg — ABNORMAL HIGH (ref 26.0–34.0)
MCHC: 35.3 g/dL (ref 30.0–36.0)
MCV: 107.3 fL — ABNORMAL HIGH (ref 80.0–100.0)
Platelets: 181 10*3/uL (ref 150–400)
RBC: 2.59 MIL/uL — ABNORMAL LOW (ref 4.22–5.81)
RDW: 21.8 % — ABNORMAL HIGH (ref 11.5–15.5)
WBC: 31.8 10*3/uL — ABNORMAL HIGH (ref 4.0–10.5)
nRBC: 0 % (ref 0.0–0.2)

## 2020-05-09 LAB — COMPREHENSIVE METABOLIC PANEL
ALT: 108 U/L — ABNORMAL HIGH (ref 0–44)
AST: 158 U/L — ABNORMAL HIGH (ref 15–41)
Albumin: 1.9 g/dL — ABNORMAL LOW (ref 3.5–5.0)
Alkaline Phosphatase: 188 U/L — ABNORMAL HIGH (ref 38–126)
Anion gap: 9 (ref 5–15)
BUN: 73 mg/dL — ABNORMAL HIGH (ref 6–20)
CO2: 17 mmol/L — ABNORMAL LOW (ref 22–32)
Calcium: 7.7 mg/dL — ABNORMAL LOW (ref 8.9–10.3)
Chloride: 106 mmol/L (ref 98–111)
Creatinine, Ser: 2.13 mg/dL — ABNORMAL HIGH (ref 0.61–1.24)
GFR calc Af Amer: 42 mL/min — ABNORMAL LOW (ref >60)
GFR calc non Af Amer: 37 mL/min — ABNORMAL LOW (ref >60)
Glucose, Bld: 119 mg/dL — ABNORMAL HIGH (ref 70–99)
Potassium: 3.8 mmol/L (ref 3.5–5.1)
Sodium: 132 mmol/L — ABNORMAL LOW (ref 135–145)
Total Bilirubin: 30.7 mg/dL (ref 0.3–1.2)
Total Protein: 5.5 g/dL — ABNORMAL LOW (ref 6.5–8.1)

## 2020-05-09 LAB — HEMOGLOBIN A1C
Hgb A1c MFr Bld: 4.2 % — ABNORMAL LOW (ref 4.8–5.6)
Mean Plasma Glucose: 73.84 mg/dL

## 2020-05-09 LAB — HEMOGLOBIN AND HEMATOCRIT, BLOOD
HCT: 25.9 % — ABNORMAL LOW (ref 39.0–52.0)
Hemoglobin: 9.2 g/dL — ABNORMAL LOW (ref 13.0–17.0)

## 2020-05-09 SURGERY — ESOPHAGOGASTRODUODENOSCOPY (EGD) WITH PROPOFOL
Anesthesia: Monitor Anesthesia Care

## 2020-05-09 MED ORDER — SODIUM CHLORIDE 0.9 % IV SOLN: INTRAVENOUS | Status: DC

## 2020-05-09 MED ORDER — ONDANSETRON HCL 4 MG/2ML IJ SOLN
INTRAMUSCULAR | Status: DC | PRN
  Administered 2020-05-09: 4 mg via INTRAVENOUS

## 2020-05-09 MED ORDER — PROPOFOL 10 MG/ML IV BOLUS
INTRAVENOUS | Status: DC | PRN
  Administered 2020-05-09: 20 mg via INTRAVENOUS

## 2020-05-09 MED ORDER — PHENYLEPHRINE HCL-NACL 10-0.9 MG/250ML-% IV SOLN
INTRAVENOUS | Status: DC | PRN
  Administered 2020-05-09: 50 ug/min via INTRAVENOUS

## 2020-05-09 MED ORDER — EPHEDRINE SULFATE-NACL 50-0.9 MG/10ML-% IV SOSY
PREFILLED_SYRINGE | INTRAVENOUS | Status: DC | PRN
  Administered 2020-05-09: 10 mg via INTRAVENOUS

## 2020-05-09 MED ORDER — PROPOFOL 500 MG/50ML IV EMUL
INTRAVENOUS | Status: DC | PRN
  Administered 2020-05-09: 100 ug/kg/min via INTRAVENOUS

## 2020-05-09 MED ORDER — LIDOCAINE 2% (20 MG/ML) 5 ML SYRINGE
INTRAMUSCULAR | Status: DC | PRN
  Administered 2020-05-09: 60 mg via INTRAVENOUS

## 2020-05-09 MED ORDER — PHENYLEPHRINE 40 MCG/ML (10ML) SYRINGE FOR IV PUSH (FOR BLOOD PRESSURE SUPPORT)
PREFILLED_SYRINGE | INTRAVENOUS | Status: DC | PRN
  Administered 2020-05-09: 80 ug via INTRAVENOUS
  Administered 2020-05-09: 120 ug via INTRAVENOUS
  Administered 2020-05-09: 80 ug via INTRAVENOUS

## 2020-05-09 SURGICAL SUPPLY — 14 items

## 2020-05-09 NOTE — Transfer of Care (Signed)
Immediate Anesthesia Transfer of Care Note  Patient: An Schnabel  Procedure(s) Performed: ESOPHAGOGASTRODUODENOSCOPY (EGD) WITH PROPOFOL (N/A )  Patient Location: Endoscopy Unit  Anesthesia Type:MAC  Level of Consciousness: awake and alert   Airway & Oxygen Therapy: Patient Spontanous Breathing and Patient connected to nasal cannula oxygen  Post-op Assessment: Report given to RN and Post -op Vital signs reviewed and stable  Post vital signs: Reviewed and stable. Remains on neo gtt, Dr. Royce Macadamia aware.  Last Vitals:  Vitals Value Taken Time  BP    Temp    Pulse    Resp    SpO2      Last Pain:  Vitals:   05/09/20 0841  TempSrc:   PainSc: 0-No pain      Patients Stated Pain Goal: 1 (66/06/30 1601)  Complications: No complications documented.

## 2020-05-09 NOTE — Anesthesia Procedure Notes (Signed)
Procedure Name: MAC Date/Time: 05/09/2020 8:50 AM Performed by: Candis Shine, CRNA Pre-anesthesia Checklist: Patient identified, Emergency Drugs available, Suction available and Patient being monitored Patient Re-evaluated:Patient Re-evaluated prior to induction Oxygen Delivery Method: Nasal cannula Dental Injury: Teeth and Oropharynx as per pre-operative assessment

## 2020-05-09 NOTE — Progress Notes (Signed)
Patient off the floor for test. 

## 2020-05-09 NOTE — Interval H&P Note (Signed)
History and Physical Interval Note:  05/09/2020 8:55 AM  Alan Becker  has presented today for surgery, with the diagnosis of GI bleeding.  The various methods of treatment have been discussed with the patient and family. After consideration of risks, benefits and other options for treatment, the patient has consented to  Procedure(s): ESOPHAGOGASTRODUODENOSCOPY (EGD) WITH PROPOFOL (N/A) as a surgical intervention.  The patient's history has been reviewed, patient examined, no change in status, stable for surgery.  I have reviewed the patient's chart and labs.  Questions were answered to the patient's satisfaction.     Katy Fitch Breckin Zafar

## 2020-05-09 NOTE — Progress Notes (Signed)
Patient's endoscopy was well-tolerated, and was pertinent for the absence of any blood in the stomach, and the absence of any varices, but the presence of significant portal gastropathy, which is the presumed source of the patient's recent bleeding.  Incidentally, there was a moderate gastric residual (perhaps 50 to 100 mL) of tube feeding and gastric contents.  Recommendations:  1.  Okay to stop octreotide at this time and see how the patient does  2.  The patient's feeding tube became malpositioned during passage of the scope, and was therefore removed, which facilitated better examination of the esophagus and stomach.  Since the patient's mental status is still suboptimal and his adequacy of oral nutritional intake is questionable, I would advocate replacement of the feeding tube.  Florencia Reasons, M.D. Pager [phone] If no answer or after 5 PM call [phone]

## 2020-05-09 NOTE — Progress Notes (Signed)
PROGRESS NOTE    Alan Becker  ZOX:[id number] DOB: 09/08/1976 DOA: 04/27/2020 PCP: Etta GrandchildJones, Thomas L, MD   Brief Narrative: 44 year old with past medical history significant for alcohol abuse who was brought to the ED by family who noticed that he has been very confused and having skin color changes.  He has a history of depression and has been drinking heavily for the last 8 years since his divorce.  His mother gave most of the history because, patient has been confused.  Mother reporter that patient has been experiencing abdominal bloating and pain, fatigue and malaise for the last 3 to 4 weeks.  Work-up in the ED consistent with acute renal failure creatinine of 8.5, albumin 1.9, bilirubin of 23, AST elevated and white count of 15.  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  On 7/9 he was a started on CRRT, GI, renal following, underwent paracentesis on 7/8 with 30 cc of dark yellow fluid which was negative for SBP.  [facility]  He underwent repeated paracentesis on 7/14 yielding 3 L of fluid. And subsequently paracentesis on 7/18 yielding 3.8 L ascites fluids consistent with SBP. Ceftriaxone was change to cefepime. Since then his WBC has started to decline.   Patient develops worsening encephalopathy on 7/15, not following command, more lethargic. CCM consulted for further evaluation and concern for decompensation. Plan to transfer to progressive care unit, support care, NG tube placement for medications and nutrition. CCM recommend support care. Palliative was consulted, patient code status was change to DNR but with plan to continue with agressive care.   His liver function test over last 2 days has remain stable, with Bilirubin trending down form 34 to 30. He develops melena, maroon stool. He was started on octreotide, IV Protonix was continue. 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 Plan is  for discontinuation of octreotide. He required transiently after endoscopy neo synephrine.     Assessment & Plan:   Active Problems:   Depression with anxiety   Alcohol use disorder, severe, dependence (HCC)   Hepatorenal syndrome (HCC)   AKI (acute kidney injury) (HCC)   Palliative care encounter   Acute liver failure without hepatic coma   Encephalopathy acute   DNR (do not resuscitate)   Chronic hepatitis C with hepatic coma (HCC)   Abdominal distension   Abnormal transaminases   Ascites  1-Acute renal failure, hepatorenal syndrome: Nephrology consulted and following. Patient was a started on midodrine/octreotide/albumin during earlier course of hospitalization.  No acute indication for dialysis, plan to monitor renal function and urine output for the next several days. Cr stable at 2, continue to monitor renal function.  HD catheter removed 7/17.  Acute decompensated alcoholic liver cirrhosis -[facility] consulted.  Dr. Bosie ClosSchooler saw the patient earlier during this admission. -Patient was a started on prednisone, his discriminate function was [id number] -He was deemed not a candidate for liver transplant given recent alcohol use -Symptomatic ascites, patient has had repeated paracentesis time 3 during this hospitalization.  -CCM consulted. Transfer to progressive care.  -Continue with rifaximin, lactulose, prednisone.  -Holding Pentoxifylline due to associated risk for bleeding.  -Underwent repeated paracentesis 7/18 due to abdominal pain and distension. Ascites fluids concerning with SBP. Received albumin after paracentesis.  -Viral hepatitis panel ordered. Hepatitis C antibody positive, RNA negative.   -LFT trending down second consecutive day.   Acute SBP;  Abdominal fluid from 7/18 ANC 750.  Antibiotics change to cefepime.  Follow culture. No growth to date.   Acute hypoxic, respiratory failure; increase oxygen supply.  Chest x ray, Hypoinflation.   Respiratory alkalosis;  support care   Leukocytosis: ? SBP, steroids.  Blood cultures; pending.  ID consulted. Antibiotics change to cefepime.  Trending down , on cefepime.   Hyponatremia; might be related to renal failure, liver failure.   GI, Bleed, melena; concern for portal gastropathy;  Appreciate Dr Matthias Hughs help.  Monitor hb, this am at 9.  IV protonix. Received one unit FFP.  Endoscopy; finding consistent with portal gastropathy.   Long-term alcohol abuse: Counseling provided Continue with thiamine.  Abnormal liver lesion: CT abdomen on 7/8 noted questionable focal hypoattenuation mass in the liver not well characterized, recommend a dedicated liver protocol CT or MRI when more stable  Major depression: Not appropriate for psychotropic med in the setting of decompensated cirrhosis  Hypokalemia; replete IV> and orally   Nutrition;  Plan to advanced diet today.  If patient is not able to maintain adequate nutrition, he will need NG tube insertion for tube feeding.    Nutrition Problem: Inadequate oral intake Etiology: poor appetite    Signs/Symptoms: per patient/family report    Interventions: Ensure Enlive (each supplement provides 350kcal and 20 grams of protein), Education, Prostat  Estimated body mass index is 30.1 kg/m as calculated from the following:   Height as of this encounter:  (1.727 m).   Weight as of this encounter: 89.8 kg.   DVT prophylaxis: SCDs, hold heparin Code Status: Full code Family Communication: plan for palliative care consulted. Patient with poor prognosis. Mother 7/20 Disposition Plan:  Status is: Inpatient  Remains inpatient appropriate because:IV treatments appropriate due to intensity of illness or inability to take PO   Dispo: The patient is from: Home              Anticipated d/c is to: To be determined              Anticipated d/c date is: 3 days              Patient currently is not medically stable to d/c.        Consultants:     [facility], [facility]  CCM  Procedures:     Antimicrobials:    Subjective: He is alert, less confuse.  Abdominal pain, improving.  His mother is asking for patient daughter to be able to visit one more time, to help with patient morale. I have advocate for it and discussed with Nursing director for the unit   Objective: Vitals:   05/09/20 1010 05/09/20 1015 05/09/20 1020 05/09/20 1200  BP: 103/63 (!) 108/56 (!) 91/53 102/71  Pulse: 80 84 78 87  Resp: (!) 26 (!) Temp:    97.6 F (36.4 C)  TempSrc:    Oral  SpO2: 99% 94% 100% 96%  Weight:      Height:        Intake/Output Summary (Last 24 hours) at 05/09/2020 1416 Last data filed at 05/09/2020 0932 Gross per 24 hour  Intake 900 ml  Output 150 ml  Net 750 ml   Filed Weights   05/06/20 0500 05/09/20 0500 05/09/20 0841  Weight: 81.9 kg 89.8 kg 89.8 kg    Examination:  General exam: Alert, icteric, NAD Respiratory system: CTA Cardiovascular system: S 1, S 2 RRR Gastrointestinal system: BS present, soft, nt, distended,  Central nervous system: alert, oriented to  place , person,  Extremities: No edema  Data Reviewed: I have personally reviewed following labs and imaging studies  CBC: Recent Labs  Lab 05/06/20 0500 05/06/20 0500 05/07/20 0416 05/07/20 0416 05/07/20 1815 05/07/20 2227 05/08/20 0347 05/08/20 0943 05/08/20 1548 05/09/20 0143 05/09/20 1149  WBC 34.6*  --  38.9*  --  40.1*  --  35.3*  --   --   --  31.8*  HGB 10.8*   < > 10.2*   < > 9.6*   < > 8.6* 9.4* 10.0* 9.2* 9.8*  HCT 30.3*   < > 28.7*   < > 27.5*   < > 24.3* 26.8* 28.1* 25.9* 27.8*  MCV 104.5*  --  104.7*  --  105.4*  --  106.1*  --   --   --  107.3*  PLT 88*  --  108*  --  125*  --  131*  --   --   --  181   < > = values in this interval not displayed.   Basic Metabolic Panel: Recent Labs  Lab 05/03/20 0413 05/03/20 0413 05/04/20 0406 05/04/20 0406 05/04/20 1650 05/05/20 0553 05/06/20 0500 05/07/20 0416  05/08/20 0347 05/09/20 1149  NA 128*   < > 132*   < >  --  135 142 140 137 132*  K 3.8   < > 3.6   < >  --  3.1* 2.8* 3.9 4.2 3.8  CL 98   < > 99   < >  --  106 110 111 108 106  CO2 20*   < > 21*   < >  --  19* 19* 18* 19* 17*  GLUCOSE 126*   < > 121*   < >  --  138* 138* 165* 138* 119*  BUN 58*   < > 66*   < >  --  76* 67* 65* 76* 73*  CREATININE 3.80*   < > 3.43*   < >  --  3.00* 2.19* 1.98* 2.09* 2.13*  CALCIUM 6.3*   < > 6.8*   < >  --  7.0* 7.7* 7.8* 7.7* 7.7*  MG  --   --   --   --  2.4 2.4 2.3  --   --   --   PHOS 3.0  --  3.3  --  3.7 4.4 3.2  --   --   --    < > = values in this interval not displayed.   GFR: Estimated Creatinine Clearance: 48.2 mL/min (A) (by C-G formula based on SCr of 2.13 mg/dL (H)). Liver Function Tests: Recent Labs  Lab 05/05/20 0553 05/06/20 0500 05/07/20 0416 05/08/20 0347 05/09/20 1149  AST 165* 139* 160* 170* 158*  ALT 79* 75* 86* 92* 108*  ALKPHOS 153* 168* 161* 129* 188*  BILITOT 34.5* 34.6* 36.8* 31.0* 30.7*  PROT 5.8* 6.0* 6.1* 5.6* 5.5*  ALBUMIN 2.3* 2.2* 2.0* 2.2* 1.9*   No results for input(s): LIPASE, AMYLASE in the last 168 hours. Recent Labs  Lab 05/03/20 2016 05/05/20 0553 05/06/20 0500  AMMONIA 111* 77* 62*   Coagulation Profile: Recent Labs  Lab 05/03/20 2016 05/04/20 0406 05/07/20 1815  INR 2.3* 2.2* 2.0*   Cardiac Enzymes: No results for input(s): CKTOTAL, CKMB, CKMBINDEX, TROPONINI in the last 168 hours. BNP (last 3 results) No results for input(s): PROBNP in the last 8760 hours. HbA1C: Recent Labs    05/09/20 0143  HGBA1C 4.2*   CBG: Recent Labs  Lab  05/08/20 2030 05/09/20 0010 05/09/20 0358 05/09/20 0753 05/09/20 1221  GLUCAP 175* 143* 134* 130* 118*   Lipid Profile: No results for input(s): CHOL, HDL, LDLCALC, TRIG, CHOLHDL, LDLDIRECT in the last 72 hours. Thyroid Function Tests: No results for input(s): TSH, T4TOTAL, FREET4, T3FREE, THYROIDAB in the last 72 hours. Anemia Panel: No results  for input(s): VITAMINB12, FOLATE, FERRITIN, TIBC, IRON, RETICCTPCT in the last 72 hours. Sepsis Labs: No results for input(s): PROCALCITON, LATICACIDVEN in the last 168 hours.  Recent Results (from the past 240 hour(s))  MRSA PCR Screening     Status: None   Collection Time: 04/30/20  8:35 PM   Specimen: Nasopharyngeal  Result Value Ref Range Status   MRSA by PCR NEGATIVE NEGATIVE Final    Comment:        The GeneXpert MRSA Assay (FDA approved for NASAL specimens only), is one component of a comprehensive MRSA colonization surveillance program. It is not intended to diagnose MRSA infection nor to guide or monitor treatment for MRSA infections. Performed at [facility], [address] [place], [place] [address]   Gram stain     Status: None   Collection Time: 05/03/20  1:10 PM   Specimen: Abdomen; Peritoneal Fluid  Result Value Ref Range Status   Specimen Description ABDOMEN  Final   Special Requests PERITONEAL FLUID  Final   Gram Stain   Final    WBC PRESENT,BOTH PMN AND MONONUCLEAR NO ORGANISMS SEEN CYTOSPIN SMEAR Performed at [facility], [address] [place], [place] [address]    Report Status 05/03/2020 FINAL  Final  Culture, body fluid-bottle     Status: None   Collection Time: 05/03/20  1:10 PM   Specimen: Peritoneal Washings  Result Value Ref Range Status   Specimen Description PERITONEAL FLUID  Final   Special Requests ABDOMEN  Final   Culture   Final    NO GROWTH 5 DAYS Performed at [facility], [address] [place], [place] [address]    Report Status 05/08/2020 FINAL  Final  Urine Culture     Status: None   Collection Time: 05/04/20  5:23 PM   Specimen: Urine, Clean Catch  Result Value Ref Range Status   Specimen Description URINE, RANDOM  Final   Special Requests NONE  Final   Culture   Final    NO GROWTH Performed at [facility], [address], [place], [place] [address]    Report Status 05/05/2020 FINAL  Final   Culture, blood (routine x 2)     Status: None (Preliminary result)   Collection Time: 05/05/20  7:53 AM   Specimen: BLOOD  Result Value Ref Range Status   Specimen Description BLOOD LEFT ANTECUBITAL  Final   Special Requests   Final    BOTTLES DRAWN AEROBIC AND ANAEROBIC Blood Culture results may not be optimal due to an inadequate volume of blood received in culture bottles   Culture   Final    NO GROWTH 4 DAYS Performed at [facility], [address], [place], [place] [address]    Report Status PENDING  Incomplete  Culture, blood (routine x 2)     Status: None (Preliminary result)   Collection Time: 05/05/20  7:57 AM   Specimen: BLOOD LEFT HAND  Result Value Ref Range Status   Specimen Description BLOOD LEFT HAND  Final   Special Requests   Final    BOTTLES DRAWN AEROBIC AND ANAEROBIC Blood  Culture adequate volume   Culture   Final    NO GROWTH 4 DAYS Performed at [facility], [address], [place], [place] [address]    Report Status PENDING  Incomplete  Gram stain     Status: None   Collection Time: 05/07/20 10:27 AM   Specimen: Abdomen; Peritoneal Fluid  Result Value Ref Range Status   Specimen Description PERITONEAL FLUID  Final   Special Requests NONE  Final   Gram Stain   Final    WBC PRESENT, PREDOMINANTLY PMN NO ORGANISMS SEEN CYTOSPIN SMEAR Performed at [facility], [address], [place], [place] [address]    Report Status 05/07/2020 FINAL  Final  Culture, body fluid-bottle     Status: None (Preliminary result)   Collection Time: 05/07/20 10:27 AM   Specimen: Peritoneal Washings  Result Value Ref Range Status   Specimen Description PERITONEAL FLUID  Final   Special Requests NONE  Final   Culture   Final    NO GROWTH 2 DAYS Performed at [facility], [address] [place], [place] [address]    Report Status PENDING  Incomplete         Radiology Studies: No results found.      Scheduled Meds: . Chlorhexidine  Gluconate Cloth  6 each Topical Q0600  . feeding supplement (PROSource TF)  45 mL Per Tube BID  . insulin aspart  0-6 Units Subcutaneous TID WC  . lactulose  20 g Oral TID  . LORazepam  0.25 mg Oral Q8H  . mouth rinse  15 mL Mouth Rinse BID  . midodrine  15 mg Oral TID WC  . pantoprazole (PROTONIX) IV  40 mg Intravenous Q12H  . prednisoLONE  30 mg Oral Q breakfast  . rifaximin  550 mg Oral BID  . sodium chloride flush  10-40 mL Intracatheter Q12H  . thiamine  100 mg Oral Daily   Continuous Infusions: . sodium chloride Stopped (04/30/20 1614)  . albumin human    . ceFEPime (MAXIPIME) IV 2 g (05/09/20 0600)  . feeding supplement (VITAL 1.5 CAL) 1,000 mL (05/08/20 1129)     LOS: 12 days    Time spent: 35 minutes.     Alba Cory, MD [facility]   If 7PM-7AM, please contact night-coverage [email or web address]  05/09/2020, 2:16 PM

## 2020-05-09 NOTE — Progress Notes (Signed)
   05/09/20 1601  Clinical Encounter Type  Visited With Family  Visit Type Initial;Spiritual support  Referral From Palliative care team  Consult/Referral To Chaplain  Spiritual Encounters  Spiritual Needs Emotional;Prayer  This chaplain responded PMT referral for spiritual care.  The Unit Secretary introduced the chaplain to the Pt. mother-Marsha.  Mindi Junker updated the chaplain in the waiting area on the Pt. and communicated her desire to let the Pt. daughter-Scarlet visit.  The chaplain affirmed Marsha's request and supported visitor restrictions by offering the alternative of FaceTime and letter writing in lieu of a visit.  Both suggestions were accepted by Mindi Junker as a way she can help the Pt. cope with his illness and maintain hope through a relationship with Scarlet.  Mindi Junker invited the chaplain to visit the Pt., describing the Pt. as more spiritual than religious, but open to prayer.  Mindi Junker shared her permission to tell the Pt. she had talked to the chaplain. The chaplain accepted Marsha's suggestion of morning visits.

## 2020-05-09 NOTE — Progress Notes (Signed)
Patient's endoscopic findings were reviewed with the patient's mother by telephone.  Patient's hepatitis C viral quantitation came back negative, which is very good news.  I also relayed this to the patient's mother.  Labs from today are pending.  Recommendations:   1. Observe off octreotide  2.  Depending how the patient does with full liquid diet, consider transitioning to 2 g sodium solid diet.  If his p.o. intake is poor, replace feeding tube in the next several days.  Florencia Reasons, M.D. Pager [phone] If no answer or after 5 PM call [phone]

## 2020-05-09 NOTE — Progress Notes (Signed)
[facility]  Date of Admission:  04/27/2020      Total days of antibiotics 7  Day 2 cefepime           ASSESSMENT: Alan Becker is a 44 y.o. male with leukocytosis.  Ceftriaxone was stopped 7/18 however repeat paracentesis cell count revealed nearly 4-fold increase in WBC with ~65% neutrophils suggesting bacterial peritonitis. Culture so far negative - he does not seem to have any symptoms but has been encephalopathic. Would consider treating for 5-7 days with Cefepime if nothing grows if he does not require an other paracentesis. If he does need one, would repeat cell count and D/C ABX if < 250 cells to conclude therapy.   Hepatitis C antibody (+) with RNA (-) indicating previously cleared infection. Nothing further needed regarding this.   Palliative medicine following his case.    PLAN: 1. Continue cefepime for 5-7 days total (pending repeat cell count on paracentesis if clinically indicated).  2. Follow peritoneal cultures   3. No need for hepatitis C treatment. Hep B panel negative.     Active Problems:   Depression with anxiety   Alcohol use disorder, severe, dependence (HCC)   Hepatorenal syndrome (HCC)   AKI (acute kidney injury) (HCC)   Palliative care encounter   Acute liver failure without hepatic coma   Encephalopathy acute   DNR (do not resuscitate)   Chronic hepatitis C with hepatic coma (HCC)   Abdominal distension   Abnormal transaminases   Ascites   . Chlorhexidine Gluconate Cloth  6 each Topical Q0600  . feeding supplement (PROSource TF)  45 mL Per Tube BID  . insulin aspart  0-6 Units Subcutaneous TID WC  . lactulose  20 g Oral TID  . LORazepam  0.25 mg Oral Q8H  . mouth rinse  15 mL Mouth Rinse BID  . midodrine  15 mg Oral TID WC  . pantoprazole (PROTONIX) IV  40 mg Intravenous Q12H  . prednisoLONE  30 mg Oral Q breakfast  . rifaximin  550 mg Oral BID  . sodium chloride flush  10-40 mL Intracatheter Q12H    . thiamine  100 mg Oral Daily    SUBJECTIVE: Sister in the room - permission given to discuss health with her at bedside.  Feeling a little better today - mostly tired. Never had any problems with ascites requiring paracentesis in the past per his report. Drinks alcohol heavily - willing to change this and stop. In consideration of (+) Hep C Ab - no history of IVDU, not likely sexually acquired per his description of previous relationships.    Review of Systems: Review of Systems  Constitutional: Positive for malaise/fatigue. Negative for chills and fever.  Respiratory: Negative for cough.   Cardiovascular: Negative for chest pain.  Gastrointestinal: Positive for abdominal pain and melena.  Neurological: Positive for weakness.     Allergies  Allergen Reactions  . Penicillins Other (See Comments)    Did it involve swelling of the face/tongue/throat, SOB, or low BP? No Did it involve sudden or severe rash/hives, skin peeling, or any reaction on the inside of your mouth or nose? Rash Did you need to seek medical attention at a hospital or doctor's office? Yes When did it last happen?Child If all above answers are "NO", may proceed with cephalosporin use.      OBJECTIVE: Vitals:   05/09/20 1010 05/09/20 1015 05/09/20 1020 05/09/20 1200  BP: 103/63 (!) 108/56 Marland Kitchen)  91/53 102/71  Pulse: 80 84 78 87  Resp: (!) 26 (!) 22 20 17   Temp:    97.6 F (36.4 C)  TempSrc:    Oral  SpO2: 99% 94% 100% 96%  Weight:      Height:       Body mass index is 30.1 kg/m.  Physical Exam Constitutional:      Comments: Sitting upright in bed no distress. Chronically ill appearing, older than stated age.   Cardiovascular:     Rate and Rhythm: Normal rate and regular rhythm.  Pulmonary:     Effort: Pulmonary effort is normal.     Breath sounds: Normal breath sounds.  Abdominal:     General: There is distension.     Tenderness: There is generalized abdominal tenderness.  Skin:     Capillary Refill: Capillary refill takes less than 2 seconds.     Coloration: Skin is jaundiced.  Neurological:     Mental Status: He is alert and oriented to person, place, and time.     Lab Results Lab Results  Component Value Date   WBC 31.8 (H) 05/09/2020   HGB 9.8 (L) 05/09/2020   HCT 27.8 (L) 05/09/2020   MCV 107.3 (H) 05/09/2020   PLT 181 05/09/2020    Lab Results  Component Value Date   CREATININE 2.13 (H) 05/09/2020   BUN 73 (H) 05/09/2020   NA 132 (L) 05/09/2020   K 3.8 05/09/2020   CL 106 05/09/2020   CO2 17 (L) 05/09/2020    Lab Results  Component Value Date   ALT 108 (H) 05/09/2020   AST 158 (H) 05/09/2020   ALKPHOS 188 (H) 05/09/2020   BILITOT 30.7 (HH) 05/09/2020     Microbiology: Recent Results (from the past 240 hour(s))  MRSA PCR Screening     Status: None   Collection Time: 04/30/20  8:35 PM   Specimen: Nasopharyngeal  Result Value Ref Range Status   MRSA by PCR NEGATIVE NEGATIVE Final    Comment:        The GeneXpert MRSA Assay (FDA approved for NASAL specimens only), is one component of a comprehensive MRSA colonization surveillance program. It is not intended to diagnose MRSA infection nor to guide or monitor treatment for MRSA infections. Performed at [facility], [address] [place], [place]   Gram stain     Status: None   Collection Time: 05/03/20  1:10 PM   Specimen: Abdomen; Peritoneal Fluid  Result Value Ref Range Status   Specimen Description ABDOMEN  Final   Special Requests PERITONEAL FLUID  Final   Gram Stain   Final    WBC PRESENT,BOTH PMN AND MONONUCLEAR NO ORGANISMS SEEN CYTOSPIN SMEAR Performed at [facility], [address], [place], [place]    Report Status 05/03/2020 FINAL  Final  Culture, body fluid-bottle     Status: None   Collection Time: 05/03/20  1:10 PM   Specimen: Peritoneal Washings  Result Value Ref Range Status   Specimen Description PERITONEAL FLUID  Final    Special Requests ABDOMEN  Final   Culture   Final    NO GROWTH 5 DAYS Performed at [facility], [address], [place], [place]    Report Status 05/08/2020 FINAL  Final  Urine Culture     Status: None   Collection Time: 05/04/20  5:23 PM   Specimen: Urine, Clean Catch  Result Value Ref Range Status  Specimen Description URINE, RANDOM  Final   Special Requests NONE  Final   Culture   Final    NO GROWTH Performed at [facility], [address] [place], [place] [address]    Report Status 05/05/2020 FINAL  Final  Culture, blood (routine x 2)     Status: None (Preliminary result)   Collection Time: 05/05/20  7:53 AM   Specimen: BLOOD  Result Value Ref Range Status   Specimen Description BLOOD LEFT ANTECUBITAL  Final   Special Requests   Final    BOTTLES DRAWN AEROBIC AND ANAEROBIC Blood Culture results may not be optimal due to an inadequate volume of blood received in culture bottles   Culture   Final    NO GROWTH 4 DAYS Performed at [facility], [address], [place], [place] [address]    Report Status PENDING  Incomplete  Culture, blood (routine x 2)     Status: None (Preliminary result)   Collection Time: 05/05/20  7:57 AM   Specimen: BLOOD LEFT HAND  Result Value Ref Range Status   Specimen Description BLOOD LEFT HAND  Final   Special Requests   Final    BOTTLES DRAWN AEROBIC AND ANAEROBIC Blood Culture adequate volume   Culture   Final    NO GROWTH 4 DAYS Performed at [facility], [address] [place], [place] [address]    Report Status PENDING  Incomplete  Gram stain     Status: None   Collection Time: 05/07/20 10:27 AM   Specimen: Abdomen; Peritoneal Fluid  Result Value Ref Range Status   Specimen Description PERITONEAL FLUID  Final   Special Requests NONE  Final   Gram Stain   Final    WBC PRESENT, PREDOMINANTLY PMN NO ORGANISMS SEEN CYTOSPIN SMEAR Performed at [facility], [address] [place], [place]  [address]    Report Status 05/07/2020 FINAL  Final  Culture, body fluid-bottle     Status: None (Preliminary result)   Collection Time: 05/07/20 10:27 AM   Specimen: Peritoneal Washings  Result Value Ref Range Status   Specimen Description PERITONEAL FLUID  Final   Special Requests NONE  Final   Culture   Final    NO GROWTH 2 DAYS Performed at [facility], [address], [place], [place] [address]    Report Status PENDING  Incomplete    Rexene Alberts, MSN, NP-C [facility]  [email or web address] Pager: [phone] Office: [phone] [facility] Main Line: [phone]

## 2020-05-09 NOTE — Anesthesia Postprocedure Evaluation (Signed)
Anesthesia Post Note  Patient: Alan Becker  Procedure(s) Performed: ESOPHAGOGASTRODUODENOSCOPY (EGD) WITH PROPOFOL (N/A )     Patient location during evaluation: PACU Anesthesia Type: MAC Level of consciousness: awake and alert and oriented Pain management: pain level controlled Vital Signs Assessment: post-procedure vital signs reviewed and stable Respiratory status: spontaneous breathing, nonlabored ventilation, respiratory function stable and patient connected to nasal cannula oxygen Cardiovascular status: stable and blood pressure returned to baseline Postop Assessment: no apparent nausea or vomiting Anesthetic complications: no   No complications documented.  Last Vitals:  Vitals:   05/09/20 1015 05/09/20 1020  BP: (!) 108/56 (!) 91/53  Pulse: 84 78  Resp: (!) 22 (!) 27  Temp:    SpO2: 94% 100%    Last Pain:  Vitals:   05/09/20 1020  TempSrc:   PainSc: 0-No pain                 [name] A.

## 2020-05-09 NOTE — Progress Notes (Signed)
SLP Cancellation Note  Patient Details Name: Angell Honse MRN: [id number] DOB: 10/25/1975   Cancelled treatment:       Reason Eval/Treat Not Completed: Medical issues which prohibited therapy. Pt for endoscopy this morning. Will f/u as able.   Mahala Menghini., M.A. CCC-SLP Acute Rehabilitation Services Pager [phone] Office [phone]  05/09/2020, 10:47 AM

## 2020-05-09 NOTE — Op Note (Signed)
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 Providers:                Bernette Redbird, MD, Tillie Fantasia, RN, Michele Mcalpine Technician Referring MD:              Medicines:                Monitored Anesthesia Care Complications:            No immediate complications. Estimated Blood Loss:     Estimated blood loss: none. Procedure:                Pre-Anesthesia Assessment:                           - Prior to the procedure, a History and Physical                            was performed, and patient medications and                            allergies were reviewed. The patient's tolerance of                            previous anesthesia was also reviewed. The risks                            and benefits of the procedure and the sedation                            options and risks were discussed with the patient.                            All questions were answered, and informed consent                            was obtained. Prior Anticoagulants: The patient has                            taken no previous anticoagulant or antiplatelet                            agents. ASA Grade Assessment: IV - A patient with  severe systemic disease that is a constant threat                            to life. After reviewing the risks and benefits,                            the patient was deemed in satisfactory condition to                            undergo the procedure.                           After obtaining informed consent, the endoscope was                            passed under direct vision.  Throughout the                            procedure, the patient's blood pressure, pulse, and                            oxygen saturations were monitored continuously. The                            GIF-H190 ([id number]) Olympus gastroscope was                            introduced through the mouth, and advanced to the                            second part of duodenum. The upper GI endoscopy was                            accomplished without difficulty. The patient                            tolerated the procedure well.                           DUE TO EQUIPMENT MALFUNCTION, PHOTOGRAPHS COULD NOT                            BE OBTAINED DURING THIS PROCEDURE. Scope In: Scope Out: Findings:      The examined esophagus was normal. The feeding tube was removed to       facilitate evaluation of the esophagus and stomach.      There is no endoscopic evidence of esophagitis or varices in the entire       esophagus.      A 2 cm hiatal hernia was present.      The stomach was entered. No blood or coffee ground material was present.      Moderate portal hypertensive gastropathy was found in the entire       examined stomach.      Retained fluid (tube feeding, estimate approximately 50- ) was  found in the gastric fundus. This was essentially entirely suctioned out.      The cardia and gastric fundus were normal on retroflexion.      Specifically, there is no endoscopic evidence of varices in the cardia.      The examined duodenum was normal. Impression:               - Normal esophagus.                           - 2 cm hiatal hernia.                           - Portal hypertensive gastropathy. This is the                            likely source for the patient's recent bleeding.                           - Retained gastric fluid.                           - Normal examined duodenum.                           - Feeding tube removed during the course of the                             procedure. Recommendation:           - Continue present medications.                           - Replace feeding tube. Procedure Code(s):        --- Professional ---                           [phone], Esophagogastroduodenoscopy, flexible,                            transoral; diagnostic, including collection of                            specimen(s) by brushing or washing, when performed                            (separate procedure) Diagnosis Code(s):        --- Professional ---                           K76.6, Portal hypertension                           K31.89, Other diseases of stomach and duodenum                           K92.1, Melena (includes Hematochezia) CPT copyright 2019 American Medical Association. All rights reserved. The codes documented in this report are preliminary and upon coder review may  be  revised to meet current compliance requirements. Bernette Redbird, MD 05/09/2020 9:22:25 AM This report has been signed electronically. Number of Addenda: 0

## 2020-05-09 NOTE — Progress Notes (Signed)
[facility] Progress Note  Subjective:    No acute events, s/p egd today (no active bleeding). No complaints. Patient reports that he is urinating 'normally'. No uop measured. Denies nausea/vomiting, chest pain, shortness of breath, swelling, dysgeusia.   Vitals:   05/09/20 1010 05/09/20 1015 05/09/20 1020 05/09/20 1200  BP: 103/63 (!) 108/56 (!) 91/53 102/71  Pulse: 80 84 78 87  Resp: (!) 26 (!) 22 20 17   Temp:    97.6 F (36.4 C)  TempSrc:    Oral  SpO2: 99% 94% 100% 96%  Weight:      Height:        Exam: Gen: NAD SKIN: jaundiced EYES icteric sclera ENT: MMM CV: RRR Lungs: clear ant, normal wob, unlabored Abd: soft, mild distension Extr: no edema Neuro: awake, speech clear and coherent     Recent Labs  Lab 05/05/20 0553 05/05/20 0553 05/06/20 0500 05/07/20 0416 05/08/20 0347 05/08/20 0943 05/09/20 0143 05/09/20 1149  K 3.1*   < > 2.8*   < > 4.2  --   --  3.8  BUN 76*   < > 67*   < > 76*  --   --  73*  CREATININE 3.00*   < > 2.19*   < > 2.09*  --   --  2.13*  CALCIUM 7.0*   < > 7.7*   < > 7.7*  --   --  7.7*  PHOS 4.4  --  3.2  --   --   --   --   --   HGB 10.9*   < > 10.8*   < > 8.6*   < > 9.2* 9.8*   < > = values in this interval not displayed.   Inpatient medications: . Chlorhexidine Gluconate Cloth  6 each Topical Q0600  . feeding supplement (PROSource TF)  45 mL Per Tube BID  . insulin aspart  0-6 Units Subcutaneous TID WC  . lactulose  20 g Oral TID  . LORazepam  0.25 mg Oral Q8H  . mouth rinse  15 mL Mouth Rinse BID  . midodrine  15 mg Oral TID WC  . pantoprazole (PROTONIX) IV  40 mg Intravenous Q12H  . prednisoLONE  30 mg Oral Q breakfast  . rifaximin  550 mg Oral BID  . sodium chloride flush  10-40 mL Intracatheter Q12H  . thiamine  100 mg Oral Daily   . sodium chloride Stopped (04/30/20 1614)  . albumin human    . ceFEPime (MAXIPIME) IV 2 g (05/09/20 0600)  . feeding supplement (VITAL 1.5 CAL) 1,000 mL (05/08/20 1129)    albuterol, alum & mag hydroxide-simeth, HYDROmorphone (DILAUDID) injection, ondansetron (ZOFRAN) IV, polyethylene glycol, prochlorperazine, simethicone, sodium chloride flush   Assessment/Plan: 1.  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 Other possibility is bile- induced tubular injury/ ATN. Urine lytes c/w HRS, FeNa < 1%. Also decreased oral intake, NSAIDs and recent ARB use could have contributed. - is recovering GFR, stable serum cr. Will see where his renal function plateaus  - weaned off levo 7/10; Sp CRRT 7/9- 7/11  - treated with midodrine, octreotide, albumin for HRS   -  Albumin with LVP's if needed in the future - removed Temp HD cath 7/17 -no indication for renal replacement therapy currently (if needed in the future, would recommend addressing goals of care first, palliative care following)  2.  Acute decompensated alcoholic hepatitis: On steroids. Rifaximin for sbp ppx  3.  Hepatic encephalopathy:  On lactulose. Per GI and TRH  4.  Hypotension, stable/improved: Cont midodrine 15mg  tid  5. Metabolic acidosis, secondary to renal injury+decreased lactate clearance from liver disease -Start sodium bicarbonate 650 mg 3 times daily  6. Possible bacterial peritonitis -cx's neg, on cefepime. Id following  , MD Anthony Sar

## 2020-05-09 NOTE — Progress Notes (Signed)
Physical Therapy Treatment Patient Details Name: Alan Becker MRN: [id number] DOB: 05-27-1976 Today's Date: 05/09/2020    History of Present Illness 44 year old with past medical history significant for alcohol abuse who was brought to the ED by family who noticed that he has been very confused and having skin color changes.Work-up in the ED consistent with acute renal failure.  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  On 7/9 he was a started on CRRT, GI renal following, underwent paracentesis on 7/8 with 30 cc of dark yellow fluid which was negative for SBP.  His care was transferred to [facility] service on 7/13.  He underwent repeated paracentesis on 7/14 results pending.    PT Comments    Pt with flat affect and history of short, irritated affect per RN. Pt asked to walk to bathroom upon PT arrival. Pt ambulated with minA with HHA, pt with significant SOB and noted RR into 40s however pt denies fatigue or difficulty breathing. Pt had BM and urinated but missed the toliet and didn't problem solve on how to clean it up or request to wash hands s/p performing hygiene. Pt progressing with ambulation but remains very deconditioned. Acute PT to cont to follow.    Follow Up Recommendations  SNF;Supervision/Assistance - 24 hour     Equipment Recommendations  Rolling walker with 5" wheels;3in1 (PT) (if home today)    Recommendations for Other Services       Precautions / Restrictions Precautions Precautions: Fall Precaution Comments: very large distended abdomen Restrictions Weight Bearing Restrictions: No    Mobility  Bed Mobility Overal bed mobility: Needs Assistance Bed Mobility: Rolling;Sidelying to Sit Rolling: Min assist Sidelying to sit: Min assist       General bed mobility comments: pt slow to move, minA for trunk elevation, verbal cues to log roll instead of pull self into long sit due to large distended  abdomen  Transfers Overall transfer level: Needs assistance Equipment used: 1 person hand held assist Transfers: Sit to/from Stand Sit to Stand: Min assist         General transfer comment: wide base of support, increased time, slow guarded,   Ambulation/Gait Ambulation/Gait assistance: Min assist Gait Distance (Feet): 10 Feet (x2, to/from bathroom) Assistive device: 1 person hand held assist Gait Pattern/deviations: Decreased stride length;Step-to pattern;Wide base of support Gait velocity: slow Gait velocity interpretation: <1.8 ft/sec, indicate of risk for recurrent falls General Gait Details: pt unsteady and reports "just let me go" however pt with 2 episodes of LOB requiring minA to regain balance. Pt with significant inc in RR in 40s   Stairs             Wheelchair Mobility    Modified Rankin (Stroke Patients Only)       Balance Overall balance assessment: Needs assistance Sitting-balance support: Bilateral upper extremity supported;Feet supported Sitting balance-Leahy Scale: Poor Sitting balance - Comments: reliant on UE support   Standing balance support: Single extremity supported Standing balance-Leahy Scale: Poor Standing balance comment: dependent on physical assist                            Cognition Arousal/Alertness: Awake/alert Behavior During Therapy: Flat affect Overall Cognitive Status: Impaired/Different from baseline Area of Impairment: Safety/judgement;Awareness;Problem solving                       Following Commands: Follows one step commands with  increased time Safety/Judgement: Decreased awareness of safety;Decreased awareness of deficits (denies SOB and difficulty despite RR at 45) Awareness: Intellectual Problem Solving: Slow processing;Requires verbal cues;Decreased initiation General Comments: pt sat on commode and missed while urinating and required verbal cues to recognize it needed to be cleaned up       Exercises      General Comments General comments (skin integrity, edema, etc.): pt jaundice, distended abdomen, RR at 44-45 during amb, SpO2 93% on RA, HR 108      Pertinent Vitals/Pain Pain Assessment: No/denies pain Faces Pain Scale: No hurt    Home Living                      Prior Function            PT Goals (current goals can now be found in the care plan section) Acute Rehab PT Goals PT Goal Formulation: With patient Time For Goal Achievement: 05/19/20 Potential to Achieve Goals: Poor Progress towards PT goals: Progressing toward goals    Frequency    Min 3X/week      PT Plan Current plan remains appropriate;Frequency needs to be updated    Co-evaluation              AM-PAC PT "6 Clicks" Mobility   Outcome Measure  Help needed turning from your back to your side while in a flat bed without using bedrails?: A Little Help needed moving from lying on your back to sitting on the side of a flat bed without using bedrails?: A Little Help needed moving to and from a bed to a chair (including a wheelchair)?: A Little Help needed standing up from a chair using your arms (e.g., wheelchair or bedside chair)?: A Lot Help needed to walk in hospital room?: A Lot Help needed climbing 3-5 steps with a railing? : Total 6 Click Score: 14    End of Session   Activity Tolerance: Patient limited by fatigue Patient left: in chair;with call bell/phone within reach;with chair alarm set;with family/visitor present Nurse Communication: Mobility status PT Visit Diagnosis: Other abnormalities of gait and mobility (R26.89);Muscle weakness (generalized) (M62.81)     Time: 6433-2951 PT Time Calculation (min) (ACUTE ONLY): 28 min  Charges:  $Gait Training: 8-22 mins $Therapeutic Activity: 8-22 mins                     Lewis Shock, PT, DPT Acute Rehabilitation Services Pager #: [phone] Office #: [phone]    Iona Hansen 05/09/2020, 3:28  PM

## 2020-05-09 NOTE — Progress Notes (Signed)
  Speech Language Pathology Treatment: Dysphagia  Patient Details Name: Alan Becker MRN: [id number] DOB: 06/10/1976 Today's Date: 05/09/2020 Time: 1202-1210 SLP Time Calculation (min) (ACUTE ONLY): 8 min  Assessment / Plan / Recommendation Clinical Impression  Pt is currently on a full liquid diet after endoscopy this morning, and is consuming thin liquids upon SLP arrival. No overt s/s of aspiration are noted despite pt wanting to eat in a semi-reclined position. Solids not provided given current diet ordered by MD, but he demonstrates adequate oral manipulation of icees. Given current presentation and documentation from initial evaluation, do not anticipate that he will have any difficulties transitioning back to regular solids with thin liquids. Pt without any concerns or subjective complaints at this time. Recommend diet advancement per MD as pt is medically ready; SLP to sign off acutely.   HPI HPI: 44yo male admitted 04/27/20 with abdominal pain, feeling sick for 2-3 weeks. PMH: alcohol abuse (at least a fifth of gin daily for several years), depression, HTN, suicidal behavior      SLP Plan  All goals met       Recommendations  Diet recommendations: Regular;Thin liquid Liquids provided via: Cup;Straw Medication Administration: Whole meds with puree Supervision: Staff to assist with self feeding Compensations: Slow rate;Small sips/bites Postural Changes and/or Swallow Maneuvers: Seated upright 90 degrees                Oral Care Recommendations: Oral care BID Follow up Recommendations: 24 hour supervision/assistance SLP Visit Diagnosis: Dysphagia, unspecified (R13.10) Plan: All goals met       GO                Osie Bond., M.A. [facility] Pager [phone] Office [phone]  05/09/2020, 12:12 PM

## 2020-05-09 NOTE — Progress Notes (Signed)
OT Cancellation Note  Patient Details Name: Jarrid Lienhard MRN: [id number] DOB: 1976-06-23   Cancelled Treatment:    Reason Eval/Treat Not Completed: Patient at procedure or test/ unavailable- pt off unit.  Will follow and see as able.   Barry Brunner, OT Acute Rehabilitation Services Pager [phone] Office [phone]    Chancy Milroy 05/09/2020, 8:49 AM

## 2020-05-09 NOTE — Progress Notes (Signed)
Nasogastric tube removed for upper endoscopy.

## 2020-05-10 ENCOUNTER — Encounter (HOSPITAL_COMMUNITY): Payer: Self-pay | Admitting: Gastroenterology

## 2020-05-10 DIAGNOSIS — F418 Other specified anxiety disorders: Secondary | ICD-10-CM

## 2020-05-10 DIAGNOSIS — K652 Spontaneous bacterial peritonitis: Secondary | ICD-10-CM

## 2020-05-10 DIAGNOSIS — R0902 Hypoxemia: Secondary | ICD-10-CM

## 2020-05-10 DIAGNOSIS — D72823 Leukemoid reaction: Secondary | ICD-10-CM

## 2020-05-10 DIAGNOSIS — K7011 Alcoholic hepatitis with ascites: Secondary | ICD-10-CM

## 2020-05-10 DIAGNOSIS — R748 Abnormal levels of other serum enzymes: Secondary | ICD-10-CM

## 2020-05-10 LAB — CBC
HCT: 27.5 % — ABNORMAL LOW (ref 39.0–52.0)
Hemoglobin: 9.8 g/dL — ABNORMAL LOW (ref 13.0–17.0)
MCH: 38 pg — ABNORMAL HIGH (ref 26.0–34.0)
MCHC: 35.6 g/dL (ref 30.0–36.0)
MCV: 106.6 fL — ABNORMAL HIGH (ref 80.0–100.0)
Platelets: 180 10*3/uL (ref 150–400)
RBC: 2.58 MIL/uL — ABNORMAL LOW (ref 4.22–5.81)
RDW: 21.5 % — ABNORMAL HIGH (ref 11.5–15.5)
WBC: 27.7 10*3/uL — ABNORMAL HIGH (ref 4.0–10.5)
nRBC: 0 % (ref 0.0–0.2)

## 2020-05-10 LAB — CULTURE, BLOOD (ROUTINE X 2)
Culture: NO GROWTH
Culture: NO GROWTH
Special Requests: ADEQUATE

## 2020-05-10 LAB — GLUCOSE, CAPILLARY
Glucose-Capillary: 122 mg/dL — ABNORMAL HIGH (ref 70–99)
Glucose-Capillary: 128 mg/dL — ABNORMAL HIGH (ref 70–99)
Glucose-Capillary: 138 mg/dL — ABNORMAL HIGH (ref 70–99)
Glucose-Capillary: 58 mg/dL — ABNORMAL LOW (ref 70–99)
Glucose-Capillary: 88 mg/dL (ref 70–99)
Glucose-Capillary: 93 mg/dL (ref 70–99)
Glucose-Capillary: 97 mg/dL (ref 70–99)

## 2020-05-10 LAB — BLOOD GAS, ARTERIAL
Acid-base deficit: 10.9 mmol/L — ABNORMAL HIGH (ref 0.0–2.0)
Bicarbonate: 12.5 mmol/L — ABNORMAL LOW (ref 20.0–28.0)
Drawn by: 358491
FIO2: 21
O2 Saturation: 92.7 %
Patient temperature: 37
pCO2 arterial: <19 mmHg — CL (ref 32.0–48.0)
pH, Arterial: 7.442 (ref 7.350–7.450)
pO2, Arterial: 66.7 mmHg — ABNORMAL LOW (ref 83.0–108.0)

## 2020-05-10 LAB — COMPREHENSIVE METABOLIC PANEL
ALT: 102 U/L — ABNORMAL HIGH (ref 0–44)
AST: 170 U/L — ABNORMAL HIGH (ref 15–41)
Albumin: 1.7 g/dL — ABNORMAL LOW (ref 3.5–5.0)
Alkaline Phosphatase: 162 U/L — ABNORMAL HIGH (ref 38–126)
Anion gap: 13 (ref 5–15)
BUN: 77 mg/dL — ABNORMAL HIGH (ref 6–20)
CO2: 15 mmol/L — ABNORMAL LOW (ref 22–32)
Calcium: 7.8 mg/dL — ABNORMAL LOW (ref 8.9–10.3)
Chloride: 102 mmol/L (ref 98–111)
Creatinine, Ser: 2.51 mg/dL — ABNORMAL HIGH (ref 0.61–1.24)
GFR calc Af Amer: 35 mL/min — ABNORMAL LOW (ref >60)
GFR calc non Af Amer: 30 mL/min — ABNORMAL LOW (ref >60)
Glucose, Bld: 94 mg/dL (ref 70–99)
Potassium: 3.9 mmol/L (ref 3.5–5.1)
Sodium: 130 mmol/L — ABNORMAL LOW (ref 135–145)
Total Bilirubin: 29.8 mg/dL (ref 0.3–1.2)
Total Protein: 5.6 g/dL — ABNORMAL LOW (ref 6.5–8.1)

## 2020-05-10 MED ORDER — FOLIC ACID 1 MG PO TABS
1.0000 mg | ORAL_TABLET | Freq: Every day | ORAL | Status: DC
  Administered 2020-05-10 – 2020-05-12 (×3): 1 mg via ORAL
  Filled 2020-05-10 (×3): qty 1

## 2020-05-10 MED ORDER — BOOST / RESOURCE BREEZE PO LIQD CUSTOM
1.0000 | Freq: Three times a day (TID) | ORAL | Status: DC
  Administered 2020-05-11 – 2020-05-12 (×3): 1 via ORAL

## 2020-05-10 MED ORDER — SODIUM BICARBONATE 650 MG PO TABS
1300.0000 mg | ORAL_TABLET | Freq: Three times a day (TID) | ORAL | Status: DC
  Administered 2020-05-10 – 2020-05-12 (×6): 1300 mg via ORAL
  Filled 2020-05-10 (×6): qty 2

## 2020-05-10 MED ORDER — PROSOURCE PLUS PO LIQD
30.0000 mL | Freq: Three times a day (TID) | ORAL | Status: DC
  Administered 2020-05-10 – 2020-05-12 (×3): 30 mL via ORAL
  Filled 2020-05-10 (×7): qty 30

## 2020-05-10 NOTE — Progress Notes (Signed)
Nutrition Follow-up  DOCUMENTATION CODES:   Not applicable  INTERVENTION:  Provide Boost Breeze po TID, each supplement provides 250 kcal and 9 grams of protein  Provide 30 ml Prosource plus po TID, each supplement provides 100 kcal and 15 grams of protein.   Encourage adequate PO intake.   NUTRITION DIAGNOSIS:   Inadequate oral intake related to poor appetite as evidenced by per patient/family report; ongoing  GOAL:   Patient will meet greater than or equal to 90% of their needs; progressing  MONITOR:   PO intake, Labs, I & O's, Weight trends, Supplement acceptance  REASON FOR ASSESSMENT:   Consult Enteral/tube feeding initiation and management  ASSESSMENT:   Pt admitted for acute decompensated alcoholic cirrhosis causing AKI, most likely hepatorenal syndrome. PMH includes EtOH abuse, depression/anxiety, suicidal behavior, and HTN.  7/8 diagnostic paracentesis, negative for spontaneous bacterial peritonitis 7/9 CRRT initiated  7/11 CRRT stopped 7/14 paracentesis, yield 3L 7/15 NG placed in IR, TF started 7/17 HD catheter removed 7/18 Paracentesis yield 3.8 L 7/20 Underwent EGD, NGT malpositioned during scope and removed  Pt is currently on 2 gram sodium diet with thin liquids. Per RN, pt po intake and appetite has been just fair. Pt with dislike of Ensure, however has consumed Prosource plus. RD to order nutritional supplements to aid in PO intake. MD requested RD to provide diet education with pt mother present. Mother unavailable during attempted time of contact. Diet handout "Low Sodium Nutrition Therapy" from the Academy of Nutrition and Dietetics Manual placed in pt discharge instructions.   Labs and medications reviewed.   Diet Order:   Diet Order            Diet 2 gram sodium Room service appropriate? Yes; Fluid consistency: Thin  Diet effective now                 EDUCATION NEEDS:   Education needs have been addressed  Skin:  Skin Assessment:  Reviewed RN Assessment  Last BM:  7/21  Height:   Ht Readings from Last 1 Encounters:  05/09/20 5\' 8"  (1.727 m)    Weight:   Wt Readings from Last 1 Encounters:  05/10/20 91.8 kg  Admission weight 7/8: 79.4 kg  BMI:  Body mass index is 30.77 kg/m.  Estimated Nutritional Needs:   Kcal:  2100-2300  Protein:  115-130 grams  Fluid:  1 L per MD  05/12/20, MS, RD, LDN RD pager number/after hours weekend pager number on Amion.

## 2020-05-10 NOTE — Progress Notes (Signed)
[facility] Progress Note  Subjective:    No acute events, patient reports he is urinating a lot. Denies chest pain, SOB. He says his abdomen feels about the same and not causing much discomfort   Vitals:   05/10/20 0415 05/10/20 0500 05/10/20 0818 05/10/20 1135  BP: (!) 106/59  (!) 97/57 (!) 95/58  Pulse: 87  92 90  Resp: 19  20 20   Temp: 98.2 F (36.8 C)  98.5 F (36.9 C) 98.4 F (36.9 C)  TempSrc: Oral  Oral Oral  SpO2: 97%  100% 98%  Weight:  91.8 kg    Height:        Exam: Gen: NAD SKIN: jaundiced EYES icteric sclera ENT: MMM CV: RRR, s1s2 Lungs: clear ant, normal wob, unlabored Abd: soft, distended, nontender Extr: no edema Neuro: awake, speech clear and coherent     Recent Labs  Lab 05/05/20 0553 05/05/20 0553 05/06/20 0500 05/07/20 0416 05/09/20 1149 05/10/20 0334  K 3.1*   < > 2.8*   < > 3.8 3.9  BUN 76*   < > 67*   < > 73* 77*  CREATININE 3.00*   < > 2.19*   < > 2.13* 2.51*  CALCIUM 7.0*   < > 7.7*   < > 7.7* 7.8*  PHOS 4.4  --  3.2  --   --   --   HGB 10.9*   < > 10.8*   < > 9.8* 9.8*   < > = values in this interval not displayed.   Inpatient medications: . Chlorhexidine Gluconate Cloth  6 each Topical Q0600  . feeding supplement (PROSource TF)  45 mL Per Tube BID  . insulin aspart  0-6 Units Subcutaneous TID WC  . lactulose  20 g Oral TID  . LORazepam  0.25 mg Oral Q8H  . mouth rinse  15 mL Mouth Rinse BID  . midodrine  15 mg Oral TID WC  . pantoprazole (PROTONIX) IV  40 mg Intravenous Q12H  . prednisoLONE  30 mg Oral Q breakfast  . rifaximin  550 mg Oral BID  . sodium bicarbonate  1,300 mg Oral TID  . sodium chloride flush  10-40 mL Intracatheter Q12H  . thiamine  100 mg Oral Daily   . sodium chloride Stopped (04/30/20 1614)  . albumin human    . ceFEPime (MAXIPIME) IV 2 g (05/10/20 05/12/20)  . feeding supplement (VITAL 1.5 CAL) 1,000 mL (05/08/20 1129)   albuterol, alum & mag hydroxide-simeth, HYDROmorphone (DILAUDID)  injection, ondansetron (ZOFRAN) IV, polyethylene glycol, prochlorperazine, simethicone, sodium chloride flush   Assessment/Plan: 1.  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 Other possibility is bile- induced tubular injury/ ATN. Urine lytes c/w HRS, FeNa < 1%. Also decreased oral intake, NSAIDs and recent ARB use could have contributed. - Cr rose to 2.5, was in a plateau phase, will continue to monitor for now  - weaned off levo 7/10; Sp CRRT 7/9- 7/11  - treated with midodrine, octreotide, albumin for HRS   -  Albumin with LVP's if needed in the future - removed Temp HD cath 7/17 -no indication for renal replacement therapy currently (of note, overall he was not deemed a liver transplant candidate therefore if dialysis is potentially needed, would recommend re-addressing goals of care and re-engaging palliative care first as overall he will not be a good candidate for dialysis). What worries me now is that his sodium levels are dropping which is an indicator of poor prognosis overall  2.  Acute decompensated alcoholic hepatitis: On steroids. Rifaximin for sbp ppx  3.  Hepatic encephalopathy:  On lactulose. Per GI and TRH  4.  Hypotension, stable/improved: Cont midodrine 15mg  tid  5. Metabolic acidosis, secondary to renal injury+decreased lactate clearance from liver disease -Start sodium bicarbonate 650 mg 3 times daily  6. Possible bacterial peritonitis -cx's neg, on cefepime. Id following  7. Hyponatremia -would recommend limiting free water intake to 1L/day for now. Slightly hypervolemic on exam but would hold off on diureses. Splanchnic vasodilatation in the setting of cirrhosis in itself can lead to an ADH response leading to hypoNa   05-19-1969, MD [facility]

## 2020-05-10 NOTE — Progress Notes (Signed)
Pharmacy Antibiotic Note  Alan Becker is a 44 y.o. male admitted on 04/27/2020 with SBP wile on Ceftriaxone .  Pharmacy has been consulted for Cefepime dosing. Currently on D4 of broadened therapy. WBC is trending down but renal fx is worsening.    Height: 5\' 8"  (172.7 cm) Weight: 91.8 kg (202 lb 6.1 oz) IBW/kg (Calculated) : 68.4  Temp (24hrs), Avg:98.5 F (36.9 C), Min:98.2 F (36.8 C), Max:98.9 F (37.2 C)  Recent Labs  Lab 05/06/20 0500 05/06/20 0500 05/07/20 0416 05/07/20 1815 05/08/20 0347 05/09/20 1149 05/10/20 0334  WBC 34.6*   < > 38.9* 40.1* 35.3* 31.8* 27.7*  CREATININE 2.19*  --  1.98*  --  2.09* 2.13* 2.51*   < > = values in this interval not displayed.    Estimated Creatinine Clearance: 41.3 mL/min (A) (by C-G formula based on SCr of 2.51 mg/dL (H)).    Allergies  Allergen Reactions  . Penicillins Other (See Comments)    Did it involve swelling of the face/tongue/throat, SOB, or low BP? No Did it involve sudden or severe rash/hives, skin peeling, or any reaction on the inside of your mouth or nose? Rash Did you need to seek medical attention at a hospital or doctor's office? Yes When did it last happen?Child If all above answers are "NO", may proceed with cephalosporin use.      Antimicrobials this admission: 7/18 Cefepime >>  7/14 Ceftriaxone >> 7/17  Dose adjustments this admission: N/a  Microbiology results: Pending   Plan:  - Continue Cefepime 2g IV q12h - Monitor patients renal function and urine output  - De-escalate ABX when appropriate   8/17, PharmD., BCPS, BCCCP Clinical Pharmacist Clinical phone for 05/10/20 until 3:30pm: [phone] If after 3:30pm, please refer to [facility] for unit-specific pharmacist

## 2020-05-10 NOTE — Progress Notes (Signed)
Subjective: No new complaints   Antibiotics:  Anti-infectives (From admission, onward)   Start     Dose/Rate Route Frequency Ordered Stop   05/07/20 1800  ceFEPIme (MAXIPIME) 2 g in sodium chloride 0.9 % 100 mL IVPB     Discontinue     2 g 200 mL/hr over 30 Minutes Intravenous Every 12 hours 05/07/20 1655     05/04/20 1045  rifaximin (XIFAXAN) tablet 550 mg     Discontinue     550 mg Oral 2 times daily 05/04/20 0930     05/03/20 1345  cefTRIAXone (ROCEPHIN) 2 g in sodium chloride 0.9 % 100 mL IVPB  Status:  Discontinued        2 g 200 mL/hr over 30 Minutes Intravenous Every 24 hours 05/03/20 1336 05/07/20 1335   04/28/20 1600  cefTRIAXone (ROCEPHIN) 2 g in sodium chloride 0.9 % 100 mL IVPB  Status:  Discontinued        2 g 200 mL/hr over 30 Minutes Intravenous Every 24 hours 04/27/20 1731 04/29/20 1635   04/27/20 1500  cefTRIAXone (ROCEPHIN) 2 g in sodium chloride 0.9 % 100 mL IVPB        2 g 200 mL/hr over 30 Minutes Intravenous  Once 04/27/20 1449 04/27/20 1726      Medications: Scheduled Meds: . Chlorhexidine Gluconate Cloth  6 each Topical Q0600  . feeding supplement (PROSource TF)  45 mL Per Tube BID  . insulin aspart  0-6 Units Subcutaneous TID WC  . lactulose  20 g Oral TID  . LORazepam  0.25 mg Oral Q8H  . mouth rinse  15 mL Mouth Rinse BID  . midodrine  15 mg Oral TID WC  . pantoprazole (PROTONIX) IV  40 mg Intravenous Q12H  . prednisoLONE  30 mg Oral Q breakfast  . rifaximin  550 mg Oral BID  . sodium bicarbonate  1,300 mg Oral TID  . sodium chloride flush  10-40 mL Intracatheter Q12H  . thiamine  100 mg Oral Daily   Continuous Infusions: . sodium chloride Stopped (04/30/20 1614)  . albumin human    . ceFEPime (MAXIPIME) IV 2 g (05/10/20 9030)  . feeding supplement (VITAL 1.5 CAL) 1,000 mL (05/08/20 1129)   PRN Meds:.albuterol, alum & mag hydroxide-simeth, HYDROmorphone (DILAUDID) injection, ondansetron (ZOFRAN) IV, polyethylene glycol,  prochlorperazine, simethicone, sodium chloride flush    Objective: Weight change: 0 kg  Intake/Output Summary (Last 24 hours) at 05/10/2020 1509 Last data filed at 05/10/2020 1308 Gross per 24 hour  Intake 10 ml  Output 200 ml  Net -190 ml   Blood pressure (!) 95/58, pulse 90, temperature 98.4 F (36.9 C), temperature source Oral, resp. rate 20, height 5\' 8"  (1.727 m), weight 91.8 kg, SpO2 98 %. Temp:  [98.2 F (36.8 C)-98.9 F (37.2 C)] 98.4 F (36.9 C) (07/21 1135) Pulse Rate:  [81-92] 90 (07/21 1135) Resp:  [19-28] 20 (07/21 1135) BP: (95-106)/(54-62) 95/58 (07/21 1135) SpO2:  [95 %-100 %] 98 % (07/21 1135) Weight:  [91.8 kg] 91.8 kg (07/21 0500)  Physical Exam: General: Alert and awake, oriented x3, flat affect. HEENT: Icteric extraocular movement tach  CVS regular rate, normal  Chest: , no wheezing, no respiratory distress Abdomen: Distended but nontender Jaundiced Neuro: nonfocal  CBC:    BMET Recent Labs    05/09/20 1149 05/10/20 0334  NA 132* 130*  K 3.8 3.9  CL 106 102  CO2 17* 15*  GLUCOSE 119* 94  BUN 73* 77*  CREATININE 2.13* 2.51*  CALCIUM 7.7* 7.8*     Liver Panel  Recent Labs    05/08/20 0347 05/08/20 0347 05/09/20 1149 05/10/20 0334  PROT 5.6*   < > 5.5* 5.6*  ALBUMIN 2.2*   < > 1.9* 1.7*  AST 170*   < > 158* 170*  ALT 92*   < > 108* 102*  ALKPHOS 129*   < > 188* 162*  BILITOT 31.0*   < > 30.7* 29.8*  BILIDIR 20.5*  --   --   --   IBILI NOT CALCULATED  --   --   --    < > = values in this interval not displayed.       Sedimentation Rate No results for input(s): ESRSEDRATE in the last 72 hours. C-Reactive Protein No results for input(s): CRP in the last 72 hours.  Micro Results: Recent Results (from the past 720 hour(s))  Culture, blood (routine x 2)     Status: None   Collection Time: 04/27/20  1:57 PM   Specimen: BLOOD  Result Value Ref Range Status   Specimen Description   Final    BLOOD RIGHT  ANTECUBITAL Performed at [facility], [address], [place], [place] [address]    Special Requests   Final    BOTTLES DRAWN AEROBIC AND ANAEROBIC Blood Culture results may not be optimal due to an inadequate volume of blood received in culture bottles Performed at [facility], [address], [place], [place] [address]    Culture   Final    NO GROWTH 5 DAYS Performed at [facility], [address] [place], [place] [address]    Report Status 05/02/2020 FINAL  Final  SARS Coronavirus 2 by RT PCR (hospital order, performed in [facility] hospital lab) Nasopharyngeal Nasopharyngeal Swab     Status: None   Collection Time: 04/27/20  1:57 PM   Specimen: Nasopharyngeal Swab  Result Value Ref Range Status   SARS Coronavirus 2 NEGATIVE NEGATIVE Final    Comment: (NOTE) SARS-CoV-2 target nucleic acids are NOT DETECTED.  The SARS-CoV-2 RNA is generally detectable in upper and lower respiratory specimens during the acute phase of infection. The lowest concentration of SARS-CoV-2 viral copies this assay can detect is 250 copies / mL. A negative result does not preclude SARS-CoV-2 infection and should not be used as the sole basis for treatment or other patient management decisions.  A negative result may occur with improper specimen collection / handling, submission of specimen other than nasopharyngeal swab, presence of viral mutation(s) within the areas targeted by this assay, and inadequate number of viral copies (<250 copies / mL). A negative result must be combined with clinical observations, patient history, and epidemiological information.  Fact Sheet for Patients:   BoilerBrush.com.cy  Fact Sheet for Healthcare Providers: https://pope.com/  This test is not yet approved or  cleared by the [place] FDA and has been authorized for detection and/or diagnosis of SARS-CoV-2 by FDA under  an Emergency Use Authorization (EUA).  This EUA will remain in effect (meaning this test can be used) for the duration of the COVID-19 declaration under Section 564(b)(1) of the Act, 21 U.S.C. section 360bbb-3(b)(1), unless the authorization is terminated or revoked sooner.  Performed at [facility], [address] [place], [place] [address]   Culture, blood (routine x 2)     Status: None   Collection Time: 04/27/20  2:37 PM  Specimen: BLOOD LEFT HAND  Result Value Ref Range Status   Specimen Description   Final    BLOOD LEFT HAND Performed at [facility], [address], [place], [place] [address]    Special Requests   Final    BOTTLES DRAWN AEROBIC AND ANAEROBIC Blood Culture results may not be optimal due to an inadequate volume of blood received in culture bottles Performed at [facility], [address], [place], [place] [address]    Culture   Final    NO GROWTH 5 DAYS Performed at [facility], [address] [place], [place] [address]    Report Status 05/02/2020 FINAL  Final  Body fluid culture (includes gram stain)     Status: None   Collection Time: 04/27/20  5:26 PM   Specimen: Peritoneal Washings; Pleural Fluid  Result Value Ref Range Status   Specimen Description   Final    PERITONEAL FLUID Performed at [facility], [address] [place], [place] [address]    Special Requests   Final    NONE Performed at [facility], [address] [place], [place] [address]    Gram Stain   Final    WBC PRESENT, PREDOMINANTLY MONONUCLEAR NO ORGANISMS SEEN CYTOSPIN SMEAR    Culture   Final    NO GROWTH 3 DAYS Performed at [facility], [address], [place], [place] [address]    Report Status 05/01/2020 FINAL  Final  MRSA PCR Screening     Status: None   Collection Time: 04/27/20  8:44 PM   Specimen: Nasopharyngeal  Result Value Ref Range Status   MRSA by PCR  NEGATIVE NEGATIVE Final    Comment:        The GeneXpert MRSA Assay (FDA approved for NASAL specimens only), is one component of a comprehensive MRSA colonization surveillance program. It is not intended to diagnose MRSA infection nor to guide or monitor treatment for MRSA infections. Performed at [facility], [address], [place], [place] [address]   MRSA PCR Screening     Status: None   Collection Time: 04/30/20  8:35 PM   Specimen: Nasopharyngeal  Result Value Ref Range Status   MRSA by PCR NEGATIVE NEGATIVE Final    Comment:        The GeneXpert MRSA Assay (FDA approved for NASAL specimens only), is one component of a comprehensive MRSA colonization surveillance program. It is not intended to diagnose MRSA infection nor to guide or monitor treatment for MRSA infections. Performed at [facility], [address], [place], [place] [address]   Gram stain     Status: None   Collection Time: 05/03/20  1:10 PM   Specimen: Abdomen; Peritoneal Fluid  Result Value Ref Range Status   Specimen Description ABDOMEN  Final   Special Requests PERITONEAL FLUID  Final   Gram Stain   Final    WBC PRESENT,BOTH PMN AND MONONUCLEAR NO ORGANISMS SEEN CYTOSPIN SMEAR Performed at [facility], [address], [place], [place] [address]    Report Status 05/03/2020 FINAL  Final  Culture, body fluid-bottle     Status: None   Collection Time: 05/03/20  1:10 PM   Specimen: Peritoneal Washings  Result Value Ref Range Status   Specimen Description PERITONEAL FLUID  Final   Special Requests ABDOMEN  Final   Culture   Final    NO GROWTH 5 DAYS Performed  at [facility], [address] [place], [place] [address]    Report Status 05/08/2020 FINAL  Final  Urine Culture     Status: None   Collection Time: 05/04/20  5:23 PM   Specimen: Urine, Clean Catch  Result Value Ref Range Status   Specimen Description URINE, RANDOM  Final   Special Requests  NONE  Final   Culture   Final    NO GROWTH Performed at [facility], [address] [place], [place] [address]    Report Status 05/05/2020 FINAL  Final  Culture, blood (routine x 2)     Status: None   Collection Time: 05/05/20  7:53 AM   Specimen: BLOOD  Result Value Ref Range Status   Specimen Description BLOOD LEFT ANTECUBITAL  Final   Special Requests   Final    BOTTLES DRAWN AEROBIC AND ANAEROBIC Blood Culture results may not be optimal due to an inadequate volume of blood received in culture bottles   Culture   Final    NO GROWTH 5 DAYS Performed at [facility], [address] [place], [place] [address]    Report Status 05/10/2020 FINAL  Final  Culture, blood (routine x 2)     Status: None   Collection Time: 05/05/20  7:57 AM   Specimen: BLOOD LEFT HAND  Result Value Ref Range Status   Specimen Description BLOOD LEFT HAND  Final   Special Requests   Final    BOTTLES DRAWN AEROBIC AND ANAEROBIC Blood Culture adequate volume   Culture   Final    NO GROWTH 5 DAYS Performed at [facility], [address] [place], [place] [address]    Report Status 05/10/2020 FINAL  Final  Gram stain     Status: None   Collection Time: 05/07/20 10:27 AM   Specimen: Abdomen; Peritoneal Fluid  Result Value Ref Range Status   Specimen Description PERITONEAL FLUID  Final   Special Requests NONE  Final   Gram Stain   Final    WBC PRESENT, PREDOMINANTLY PMN NO ORGANISMS SEEN CYTOSPIN SMEAR Performed at [facility], [address] [place], [place] [address]    Report Status 05/07/2020 FINAL  Final  Culture, body fluid-bottle     Status: None (Preliminary result)   Collection Time: 05/07/20 10:27 AM   Specimen: Peritoneal Washings  Result Value Ref Range Status   Specimen Description PERITONEAL FLUID  Final   Special Requests NONE  Final   Culture   Final    NO GROWTH 3 DAYS Performed at [facility], [address] [place], [place] [address]     Report Status PENDING  Incomplete    Studies/Results: No results found.    Assessment/Plan:  INTERVAL HISTORY: Cultures remain unrevealing   Active Problems:   Depression with anxiety   Alcohol use disorder, severe, dependence (HCC)   Hepatorenal syndrome (HCC)   AKI (acute kidney injury) (HCC)   Palliative care encounter   Acute liver failure without hepatic coma   Encephalopathy acute   DNR (do not resuscitate)   Chronic hepatitis C with hepatic coma (HCC)   Abdominal distension   Abnormal transaminases   Ascites   SBP (spontaneous bacterial peritonitis) (HCC)    Odis Hollingsheadravis Flenniken is a 44 y.o. male with alcoholic liver disease and severe liver failure with hepatorenal syndrome.  He was on corticosteroids and has leukocytosis.  He also had white cells in his peritoneal fluid  consistent with SBP though cultures have been unrevealing.  He is hepatitis C antibody positive though his HCVRNA is negative new  1.  Possible SBP  --complete 2 more days of cefepime --followup cultures   2. Liver failure: GI/Hepatology following  3. Renal: GFR worse. Renal following  I will sign off for now please call with further questions    LOS: 13 days   Acey Lav 05/10/2020, 3:09 PM

## 2020-05-10 NOTE — Progress Notes (Signed)
Palliative:  Meeting scheduled with patient's mother for 10:30 tomorrow, 7/22.  Gerlean Ren, DNP, AGNP-C Palliative Medicine Team Team Phone # [phone]  Pager # [phone]  NO CHARGE

## 2020-05-10 NOTE — Progress Notes (Signed)
CRITICAL VALUE ALERT  Critical Value:  Pco2 < 19  Date & Time Notied:  05/10/20 0955  Provider Notified: Carolyne Littles  Orders Received/Actions taken: None Recived

## 2020-05-10 NOTE — Discharge Instructions (Signed)
Low Sodium Nutrition Therapy  °Eating less sodium can help you if you have high blood pressure, heart failure, or kidney or liver disease.  ° °Your body needs a little sodium, but too much sodium can cause your body to hold onto extra water. This extra water will raise your blood pressure and can cause damage to your heart, kidneys, or liver as they are forced to work harder.  ° °Sometimes you can see how the extra fluid affects you because your hands, legs, or belly swell. You may also hold water around your heart and lungs, which makes it hard to breathe.  ° °Even if you take medication for blood pressure or a water pill (diuretic) to remove fluid, it is still important to have less salt in your diet.  ° °Check with your primary care provider before drinking alcohol since it may affect the amount of fluid in your body and how your heart, kidneys, or liver work. °Sodium in Food °A low-sodium meal plan limits the sodium that you get from food and beverages to 1,500-2,000 milligrams (mg) per day. Salt is the main source of sodium. Read the nutrition label on the package to find out how much sodium is in one serving of a food.  °• Select foods with 140 milligrams (mg) of sodium or less per serving.  °• You may be able to eat one or two servings of foods with a little more than 140 milligrams (mg) of sodium if you are closely watching how much sodium you eat in a day.  °• Check the serving size on the label. The amount of sodium listed on the label shows the amount in one serving of the food. So, if you eat more than one serving, you will get more sodium than the amount listed. ° °Tips °Cutting Back on Sodium °• Eat more fresh foods.  °• Fresh fruits and vegetables are low in sodium, as well as frozen vegetables and fruits that have no added juices or sauces.  °• Fresh meats are lower in sodium than processed meats, such as bacon, sausage, and hotdogs.  °• Not all processed foods are unhealthy, but some processed foods  may have too much sodium.  °• Eat less salt at the table and when cooking. One of the ingredients in salt is sodium.  °• One teaspoon of table salt has 2,300 milligrams of sodium.  °• Leave the salt out of recipes for pasta, casseroles, and soups. °• Be a smart shopper.  °• Food packages that say “Salt-free”, sodium-free”, “very low sodium,” and “low sodium” have less than 140 milligrams of sodium per serving.  °• Beware of products identified as “Unsalted,” “No Salt Added,” “Reduced Sodium,” or “Lower Sodium.” These items may still be high in sodium. You should always check the nutrition label. °• Add flavors to your food without adding sodium.  °• Try lemon juice, lime juice, or vinegar.  °• Dry or fresh herbs add flavor.  °• Buy a sodium-free seasoning blend or make your own at home. °• You can purchase salt-free or sodium-free condiments like barbeque sauce in stores and online. Ask your registered dietitian nutritionist for recommendations and where to find them.  °•  °Eating in Restaurants °• Choose foods carefully when you eat outside your home. Restaurant foods can be very high in sodium. Many restaurants provide nutrition facts on their menus or their websites. If you cannot find that information, ask your server. Let your server know that you want your food   to be cooked without salt and that you would like your salad dressing and sauces to be served on the side.      Foods Recommended  Food Group  Foods Recommended   Grains  Bread, bagels, rolls without salted tops Homemade bread made with reduced-sodium baking powder Cold cereals, especially shredded wheat and puffed rice Oats, grits, or cream of wheat Pastas, quinoa, and rice Popcorn, pretzels or crackers without salt Corn tortillas   Protein Foods  Fresh meats and fish; [place] bacon (check the nutrition labels - make sure they are not packaged in a sodium solution) Canned or packed tuna (no more than 4 ounces at 1 serving) Beans  and peas Soybeans) and tofu Eggs Nuts or nut butters without salt   Dairy  Milk or milk powder Plant milks, such as rice and soy Yogurt, including Greek yogurt Small amounts of natural cheese (blocks of cheese) or reduced-sodium cheese can be used in moderation. (Swiss, ricotta, and fresh mozzarella cheese are lower in sodium than the others) Cream Cheese Low sodium cottage cheese   Vegetables  Fresh and frozen vegetables without added sauces or salt Homemade soups (without salt) Low-sodium, salt-free or sodium-free canned vegetables and soups   Fruit  Fresh and canned fruits Dried fruits, such as raisins, cranberries, and prunes   Oils  Tub or liquid margarine, regular or without salt Canola, corn, peanut, olive, safflower, or sunflower oils   Condiments  Fresh or dried herbs such as basil, bay leaf, dill, mustard (dry), nutmeg, paprika, parsley, rosemary, sage, or thyme.  Low sodium ketchup Vinegar  Lemon or lime juice Pepper, red pepper flakes, and cayenne. Hot sauce contains sodium, but if you use just a drop or two, it will not add up to much.  Salt-free or sodium-free seasoning mixes and marinades Simple salad dressings: vinegar and oil     Foods Not Recommended  Food Group  Foods Not Recommended   Grains  Breads or crackers topped with salt Cereals (hot/cold) with more than 300 mg sodium per serving Biscuits, cornbread, and other quick breads prepared with baking soda Pre-packaged bread crumbs Seasoned and packaged rice and pasta mixes Self-rising flours   Protein Foods  Cured meats: Bacon, ham, sausage, pepperoni and hot dogs Canned meats (chili, vienna sausage, or sardines) Smoked fish and meats Frozen meals that have more than 600 mg of sodium per serving Egg substitute (with added sodium)   Dairy  Buttermilk Processed cheese spreads Cottage cheese (1 cup may have over 500 mg of sodium; look for low-sodium.) American or feta cheese Shredded  Cheese has more sodium than blocks of cheese String cheese   Vegetables  Canned vegetables (unless they are salt-free, sodium-free or low sodium) Frozen vegetables with seasoning and sauces Sauerkraut and pickled vegetables Canned or dried soups (unless they are salt-free, sodium-free, or low sodium) [place] fries and onion rings   Fruit  Dried fruits preserved with additives that have sodium   Oils  Salted butter or margarine, all types of olives   Condiments  Salt, sea salt, kosher salt, onion salt, and garlic salt Seasoning mixes with salt Bouillon cubes Ketchup Barbeque sauce and Worcestershire sauce unless low sodium Soy sauce Salsa, pickles, olives, relish Salad dressings: ranch, blue cheese, [place], and [place].     Low Sodium Sample 1-Day Menu   Breakfast  1 cup cooked oatmeal   1 slice whole wheat bread toast   1 tablespoon peanut butter without salt   1 banana  1 cup 1% milk   Lunch  Tacos made with: 2 corn tortillas    cup black beans, low sodium    cup roasted or grilled chicken (without skin)    avocado   Squeeze of lime juice   1 cup salad greens   1 tablespoon low-sodium salad dressing    cup strawberries   1 orange   Afternoon Snack  1/3 cup grapes   6 ounces yogurt   Evening Meal  3 ounces herb-baked fish   1 baked potato   2 teaspoons olive oil    cup cooked carrots   2 thick slices tomatoes on:   2 lettuce leaves   1 teaspoon olive oil   1 teaspoon balsamic vinegar   1 cup 1% milk   Evening Snack  1 apple    cup almonds without salt     Low-Sodium Vegetarian (Lacto-Ovo) Sample 1-Day Menu   Breakfast  1 cup cooked oatmeal   1 slice whole wheat toast   1 tablespoon peanut butter without salt   1 banana   1 cup 1% milk   Lunch  Tacos made with: 2 corn tortillas    cup black beans, low sodium    cup roasted or grilled chicken (without skin)    avocado   Squeeze of lime juice   1  cup salad greens   1 tablespoon low-sodium salad dressing    cup strawberries   1 orange   Evening Meal  Stir fry made with:  cup tofu   1 cup brown rice    cup broccoli    cup green beans    cup peppers    tablespoon peanut oil   1 orange   1 cup 1% milk   Evening Snack  4 strips celery   2 tablespoons hummus   1 hard-boiled egg     Low-Sodium Vegan Sample 1-Day Menu   Breakfast  1 cup cooked oatmeal   1 tablespoon peanut butter without salt   1 cup blueberries   1 cup soymilk fortified with calcium, vitamin B12, and vitamin D   Lunch  1 small whole wheat pita    cup cooked lentils   2 tablespoons hummus   4 carrot sticks   1 medium apple   1 cup soymilk fortified with calcium, vitamin B12, and vitamin D   Evening Meal  Stir fry made with:  cup tofu   1 cup brown rice    cup broccoli    cup green beans    cup peppers    tablespoon peanut oil   1 cup cantaloupe   Evening Snack  1 cup soy yogurt    cup mixed nuts   Copyright 2020  Academy of [employer]. All rights reserved    Sodium Free Flavoring Tips    When cooking, the following items may be used for flavoring instead of salt or seasonings that contain sodium.  Remember: A little bit of spice goes a long way! Be careful not to overseason.  Spice Blend Recipe (makes about ? cup)  5 teaspoons onion powder   2 teaspoons garlic powder   2 teaspoons paprika   2 teaspoon dry mustard   1 teaspoon crushed thyme leaves    teaspoon white pepper    teaspoon celery seed Food Item Flavorings  Beef Basil, bay leaf, caraway, curry, dill, dry mustard, garlic, grape jelly, green pepper, mace, marjoram, mushrooms (fresh), nutmeg, onion or onion powder, parsley, pepper,  rosemary, sage  Chicken Basil, cloves, cranberries, mace, mushrooms (fresh), nutmeg, oregano, paprika, parsley, pineapple, saffron, sage, savory, tarragon, thyme, tomato,  turmeric  Egg Chervil, curry, dill, dry mustard, garlic or garlic powder, green pepper, jelly, mushrooms (fresh), nutmeg, onion powder, paprika, parsley, rosemary, tarragon, tomato  Fish Basil, bay leaf, chervil, curry, dill, dry mustard, green pepper, lemon juice, marjoram, mushrooms (fresh), paprika, pepper, tarragon, tomato, turmeric  Lamb Cloves, curry, dill, garlic or garlic powder, mace, mint, mint jelly, onion, oregano, parsley, pineapple, rosemary, tarragon, thyme  Pork Applesauce, basil, caraway, chives, cloves, garlic or garlic powder, onion or onion powder, rosemary, thyme  Veal Apricots, basil, bay leaf, currant jelly, curry, ginger, marjoram, mushrooms (fresh), oregano, paprika  Vegetables Basil, dill, garlic or garlic powder, ginger, lemon juice, mace, marjoram, nutmeg, onion or onion powder, tarragon, tomato, sugar or sugar substitute, salt-free salad dressing, vinegar  Desserts Allspice, anise, cinnamon, cloves, ginger, mace, nutmeg, vanilla extract, other extracts   Copyright 2020  Academy of [employer] and Dietetics. All rights reserved  Fluid Restricted Nutrition Therapy  You have been prescribed this diet because your condition affects how much fluid you can eat or drink. If your heart, liver, or kidneys aren't working properly, you may not be able to effectively eliminate fluids from the body and this may cause swelling (edema) in the legs, arms, and/or stomach. Drink no more than ____1_____ liter a day  You don't need to stop eating or drinking the same fluids you normally would, but you may need to eat or drink less than usual.   Your registered dietitian nutritionist will help you determine the correct amount of fluid to consume during the day Breakfast Include fluids taken with medications  Lunch Include fluids taken with medications  Dinner Include fluids taken with medications  Bedtime Snack Include fluids taken with medications     Tips What Are Fluids?  A  fluid is anything that is liquid or anything that would melt if left at room temperature. You will need to count these foods and liquids--including any liquid used to take medication--as part of your daily fluid intake. Some examples are:  Alcohol (drink only with your doctor's permission)   Coffee, tea, and other hot beverages   Gelatin (Jell-O)   Gravy   Ice cream, sherbet, sorbet   Ice cubes, ice chips   Milk, liquid creamer   Nutritional supplements   Popsicles   Vegetable and fruit juices; fluid in canned fruit   Watermelon   Yogurt   Soft drinks, lemonade, limeade   Soups   Syrup How Do I Measure My Fluid Intake?  Record your fluid intake daily.   Tip: Every day, each time you eat or drink fluids, pour water in the same amount into an empty container that can hold the same amount of fluids you are allowed daily. This may help you keep track of how much fluid you are taking in throughout the day.   To accurately keep track of how much liquid you take in, measure the size of the cups, glasses, and bowls you use. If you eat soup, measure how much of it is liquid and how much is solid (such as noodles, vegetables, meat). Conversions for Measuring Fluid Intake  Milliliters (mL) Liters (L) Ounces (oz) Cups (c)  1000 1 32 4  1200 1.2 40 5  1500 1.5 50 6 1/4  1800 1.8 60 7 1/2  2000 2 67 8 1/3  Tips to Reduce Your Thirst  Chew gum  or suck on hard candy.   Rinse or gargle with mouthwash. Do not swallow.   Ice chips or popsicles my help quench thirst, but this too needs to be calculated into the total restriction. Melt ice chips or cubes first to figure out how much fluid they produce (for example, experiment with melting  cup ice chips or 2 ice cubes).   Add a lemon wedge to your water.   Limit how much salt you take in. A high salt intake might make you thirstier.   Don't eat or drink all your allowed liquids at once. Space your liquids out through the day.    Use small glasses and cups and sip slowly. If allowed, take your medications with fluids you eat or drink during a meal.   Fluid-Restricted Nutrition Therapy Sample 1-Day Menu  Breakfast 1 slice wheat toast  1 tablespoon peanut butter  1/2 cup yogurt (120 milliliters)  1/2 cup blueberries  1 cup milk (240 milliliters)   Lunch 3 ounces sliced [place]  2 slices whole wheat bread  1/2 cup lettuce for sandwich  2 slices tomato for sandwich  1 ounce reduced-fat, reduced-sodium cheese  1/2 cup fresh carrot sticks  1 banana  1 cup unsweetened tea (240 milliliters)   Evening Meal 8 ounces soup (240 milliliters)  3 ounces salmon  1/2 cup quinoa  1 cup green beans  1 cup mixed greens salad  1 tablespoon olive oil  1 cup coffee (240 milliliters)  Evening Snack 1/2 cup sliced peaches  1/2 cup frozen yogurt (120 milliliters)  1 cup water (240 milliliters)  Copyright 2020  Academy of [employer] and Dietetics. All rights reserved  Roslyn Smiling, MS, RD, LDN Clinical Dietitian Office phone# [phone]

## 2020-05-10 NOTE — Progress Notes (Signed)
Occupational Therapy Treatment Patient Details Name: Alan Becker MRN: [id number] DOB: 13-Jul-1976 Today's Date: 05/10/2020    History of present illness 44 year old with past medical history significant for alcohol abuse who was brought to the ED by family who noticed that he has been very confused and having skin color changes.Work-up in the ED consistent with acute renal failure.  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  On 7/9 he was a started on CRRT, GI renal following, underwent paracentesis on 7/8 with 30 cc of dark yellow fluid which was negative for SBP.  His care was transferred to [facility] service on 7/13.  He underwent repeated paracentesis on 7/14 results pending.   OT comments  Pt seen for OT follow up with focus on BADL mobility progression. Pt presented with flat affect, minimally engaged, slightly irritable. He is very fixated on going home. He requires simple one step commands to complete basic tasks. Also noted cognitive deficits in orientation, awareness, safety, problem solving, and memory. Pt was incontinent of urine and stool in bed and unaware, requiring total A for posterior peri hygiene. Pt was able to complete sit <> stand with min A and side steps up in bed with mod A for steadying support. Per RN, pt and family are to meet with palliative care with the possibility of going home with hospice. OT will continue to follow along with plan as listed below unless pt chooses comfort care. Goals updated to reflect current progress.    Follow Up Recommendations  SNF;Other (comment) (pending palliative consult, pt may decide to go home with hospice)    Equipment Recommendations  3 in 1 bedside commode;Wheelchair (measurements OT);Wheelchair cushion (measurements OT);Hospital bed    Recommendations for Other Services      Precautions / Restrictions Precautions Precautions: Fall Precaution Comments: very large distended  abdomen Restrictions Weight Bearing Restrictions: No       Mobility Bed Mobility Overal bed mobility: Needs Assistance       Supine to sit: Mod assist Sit to supine: Min assist   General bed mobility comments: slow initation of transfer and hand placement. Requiring multimodal cueing, specifically tactile cues to place on hand rails. assist to elevate trunk and assist for legs back to bed  Transfers Overall transfer level: Needs assistance Equipment used: 1 person hand held assist Transfers: Sit to/from Stand Sit to Stand: Min assist         General transfer comment: wide base of support, increased time, slow guarded,     Balance Overall balance assessment: Needs assistance Sitting-balance support: Bilateral upper extremity supported;Feet supported Sitting balance-Leahy Scale: Poor Sitting balance - Comments: reliant on UE support   Standing balance support: Single extremity supported Standing balance-Leahy Scale: Poor Standing balance comment: dependent on physical assist                           ADL either performed or assessed with clinical judgement   ADL Overall ADL's : Needs assistance/impaired Eating/Feeding: Set up;Sitting;Cueing for safety;Cueing for sequencing   Grooming: Minimal assistance;Sitting;Cueing for safety;Cueing for sequencing   Upper Body Bathing: Minimal assistance;Sitting   Lower Body Bathing: Maximal assistance;Sitting/lateral leans;Sit to/from stand   Upper Body Dressing : Minimal assistance;Sitting   Lower Body Dressing: Maximal assistance;Sitting/lateral leans;Sit to/from stand   Toilet Transfer: Moderate assistance;Stand-pivot;BSC Toilet Transfer Details (indicate cue type and reason): unsteady on feet, requiring support to maintain standing Toileting- Clothing Manipulation and  Hygiene: Total assistance;Sit to/from stand Toileting - Clothing Manipulation Details (indicate cue type and reason): mod A to steady in  standing but total A for posterior peri hygiene. Pt incontinent and unaware     Functional mobility during ADLs: Moderate assistance;Cueing for safety;Cueing for sequencing       Vision Baseline Vision/History: Wears glasses Wears Glasses: At all times Patient Visual Report: No change from baseline     Perception     Praxis      Cognition Arousal/Alertness: Awake/alert Behavior During Therapy: Flat affect Overall Cognitive Status: Impaired/Different from baseline Area of Impairment: Safety/judgement;Awareness;Problem solving;Attention;Orientation;Following commands;Memory                 Orientation Level: Disoriented to;Place;Time;Situation Current Attention Level: Focused Memory: Decreased recall of precautions;Decreased short-term memory Following Commands: Follows one step commands with increased time Safety/Judgement: Decreased awareness of safety;Decreased awareness of deficits Awareness: Intellectual Problem Solving: Slow processing;Requires verbal cues;Decreased initiation General Comments: pt laying in urine in bed but stating he is cleaned up and fine. Requires simple one step commands with increased time to complete basic BADL tasks. Very perseverative on going home but has difficulty orienting self. States he "feels fine" despite RR up to 40s with minimal activity        Exercises     Shoulder Instructions       General Comments pt jaundiced, distended abdomen, RR up to 44-45 with activity. SpO2 stable on RA.    Pertinent Vitals/ Pain       Pain Assessment: No/denies pain  Home Living                                          Prior Functioning/Environment              Frequency  Min 2X/week        Progress Toward Goals  OT Goals(current goals can now be found in the care plan section)  Progress towards OT goals: Progressing toward goals  Acute Rehab OT Goals Patient Stated Goal: go home OT Goal Formulation: With  family Time For Goal Achievement: 05/18/20 Potential to Achieve Goals: Fair  Plan Discharge plan needs to be updated    Co-evaluation                 AM-PAC OT "6 Clicks" Daily Activity     Outcome Measure   Help from another person eating meals?: A Little Help from another person taking care of personal grooming?: A Little Help from another person toileting, which includes using toliet, bedpan, or urinal?: A Lot Help from another person bathing (including washing, rinsing, drying)?: A Lot Help from another person to put on and taking off regular upper body clothing?: A Lot Help from another person to put on and taking off regular lower body clothing?: Total 6 Click Score: 13    End of Session    OT Visit Diagnosis: Unsteadiness on feet (R26.81);Muscle weakness (generalized) (M62.81);Other symptoms and signs involving cognitive function   Activity Tolerance Patient tolerated treatment well   Patient Left in bed;with call bell/phone within reach;with bed alarm set   Nurse Communication Mobility status        Time: 7262-0355 OT Time Calculation (min): 18 min  Charges: OT General Charges $OT Visit: 1 Visit OT Treatments $Self Care/Home Management : 8-22 mins  Dalphine Handing, MSOT, OTR/L Acute Rehabilitation Services [facility] Office  Number: [phone] Pager: [phone]  Dalphine Handing 05/10/2020, 6:13 PM

## 2020-05-10 NOTE — Progress Notes (Signed)
CRITICAL VALUE ALERT  Critical Value:  29.8 Bilirubin  Date & Time Notied: 05/10/2020 0615  Provider Notified: [facility] MD notified   Orders Received/Actions taken: No new orders  Denver Faster, RN

## 2020-05-10 NOTE — Progress Notes (Signed)
PROGRESS NOTE    Alan Becker  UEA:[id number] DOB: [birth year] DOA: 04/27/2020 PCP: [name], MD     Brief Narrative:  44 year old WM  PMHx EtOH abuse, Hepatorenal syndrome, Hepatitis C without Hhepatic coma acute liver failure without Hepatic coma, Depression with Anxiety,  brought to the ED by family who noticed that he has been very confused and having skin color changes.  He has a history of depression and has been drinking heavily for the last 8 years since his divorce.  His mother gave most of the history because, patient has been confused.  Mother reporter that patient has been experiencing abdominal bloating and pain, fatigue and malaise for the last 3 to 4 weeks.  Work-up in the ED consistent with acute renal failure creatinine of 8.5, albumin 1.9, bilirubin of 23, AST elevated and white count of 15.  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  On 7/9 he was a started on CRRT, GI, renal following, underwent paracentesis on 7/8 with 30 cc of dark yellow fluid which was negative for SBP.  His care was transferred to [facility] service on 7/13.  He underwent repeated paracentesis on 7/14 yielding 3 L of fluid. And subsequently paracentesis on 7/18 yielding 3.8 L ascites fluids consistent with SBP. Ceftriaxone was change to cefepime. Since then his WBC has started to decline.   Patient develops worsening encephalopathy on 7/15, not following command, more lethargic. CCM consulted for further evaluation and concern for decompensation. Plan to transfer to progressive care unit, support care, NG tube placement for medications and nutrition. CCM recommend support care. Palliative was consulted, patient code status was change to DNR but with plan to continue with agressive care.   His liver function test over last 2 days has remain stable, with Bilirubin trending down form 34 to 30. He develops melena, maroon stool. He was started on octreotide, IV  Protonix was continue. 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 Plan is for discontinuation of octreotide. He required transiently after endoscopy neo synephrine.     Subjective: Afebrile overnight worsening respiratory alkalosis compensated.  Patient states he wants to go home even though he understands this may result in his death.  Decided he will talk this over with his mother who is at his bedside. A/O x2 (does not know where, when), does know that he is in a hospital No acute respiratory distress    Assessment & Plan: Covid vaccination; positive vaccination   Active Problems:   Depression with anxiety   Alcohol use disorder, severe, dependence (HCC)   Hepatorenal syndrome (HCC)   AKI (acute kidney injury) (HCC)   Palliative care encounter   Acute liver failure without hepatic coma   Encephalopathy acute   DNR (do not resuscitate)   Chronic hepatitis C with hepatic coma (HCC)   Abdominal distension   Abnormal transaminases   Ascites   SBP (spontaneous bacterial peritonitis) (HCC)   Acute Renal Failure/Hepatorenal syndrome  -Nephrology consulted and following. -Patient was a started on midodrine/octreotide/albumin during earlier course of hospitalization.  -No acute indication for dialysis, plan to monitor renal function and urine output for the next several days. -HD catheter removed 7/17  Recent Labs  Lab 05/06/20 0500 05/07/20 0416 05/08/20 0347 05/09/20 1149 05/10/20 0334  CREATININE 2.19* 1.98* 2.09* 2.13* 2.51*  -Worsening renal function  Active decompensated EtOH liver cirrhosis -[facility] consulted.  Dr. Bosie Clos saw the patient earlier during this admission. -Patient was a  started on prednisone, his discriminate function was 16 -Patient deemed NOT a liver transplant candidate secondary EtOH use  -Symptomatic ascites.  Paracentesis x3 during his hospitalization.  Currently no indication for additional paracentesis.  Started on  antibiotics -7/18 paracentesis fluid consistent with SBP -Lactulose 20 g TID -Prednisolone 30 mg daily -Rifaximin 550 mg BID -Pentoxifylline (held) associated bleeding risk -Viral hepatitis panel; hepatitis C antibody positive, RNA negative -LFTs appear to be trending back up   Acute SBP -Abdominal fluid from 7/18 ANC 750.  -Complete course of antibiotics .  -Peritoneal fluid NGTD  Acute respiratory failure with hypoxia -Titrate O2 to maintain SPO2 > 88%   Respiratory alkalosis -7/21 worsening.  Compensated  Leukocytosis:  -Multifactorial steroids, SBP less likely no fever negative bands negative left shift.  To date peritoneal fluid and blood cultures negative -ID consulted  Hyponatremia;  -Secondary to hepatorenal syndrome -We will monitor closely, any attempt to overtly correct will worsen patient's overall condition. .   GI, Bleed, melena; concern for portal gastropathy;  -Dr Matthias Hughs consulted.  -Monitor patient's H/H -Protonix IV 40 mg BID  Long-term alcohol abuse: -Folic acid 1 -Thiamine 100 mg daily   Abnormal liver lesion: -CT abdomen on 7/8 noted questionable focal hypoattenuation mass in the liver not well characterized, -given that patient is requesting to go home no need for further diagnostic studies.    Major depression:  -Not appropriate for psychotropic med in the setting of decompensated cirrhosis  Hypokalemia;  -Resolved  Nutrition;  Plan to advanced diet today.  If patient is not able to maintain adequate nutrition, he will need NG tube insertion for tube feeding.    Nutrition Problem: Inadequate oral intake -Etiology: poor appetite -Interventions: Ensure Enlive (each supplement provides 350kcal and 20 grams of protein), Education, Prostat  Goals of care -7/21 patient expressing wish to discontinue further treatment/diagnostic studies and go home.  Patient is going to discuss with chaplain, palliative care, and his mother and  inform me of his final decision in the next 24 hours.  Patient understands that if he goes home his health will most likely rapidly deteriorate.  DVT prophylaxis: SCD Code Status: DNR Family Communication: 7/21 mother present at bedside for discussion of plan of care Status is: Inpatient    Dispo: The patient is from: Home              Anticipated d/c is to: Home              Anticipated d/c date is: 7/24              Patient currently extremely unstable      Consultants:  GI ID   Procedures/Significant Events:    I have personally reviewed and interpreted all radiology studies and my findings are as above.  VENTILATOR SETTINGS:    Cultures 7/8 SARS coronavirus negative 7/18 peritoneal fluid NGTD   Antimicrobials: Anti-infectives (From admission, onward)   Start     Ordered Stop   05/07/20 1800  ceFEPIme (MAXIPIME) 2 g in sodium chloride 0.9 % 100 mL IVPB     Discontinue     05/07/20 1655 05/11/20 1511   05/04/20 1045  rifaximin (XIFAXAN) tablet 550 mg     Discontinue     05/04/20 0930     05/03/20 1345  cefTRIAXone (ROCEPHIN) 2 g in sodium chloride 0.9 % 100 mL IVPB  Status:  Discontinued        05/03/20 1336 05/07/20 1335   04/28/20 1600  cefTRIAXone (ROCEPHIN) 2 g in sodium chloride 0.9 % 100 mL IVPB  Status:  Discontinued        04/27/20 1731 04/29/20 1635   04/27/20 1500  cefTRIAXone (ROCEPHIN) 2 g in sodium chloride 0.9 % 100 mL IVPB        04/27/20 1449 04/27/20 1726       Devices    LINES / TUBES:      Continuous Infusions: . sodium chloride Stopped (04/30/20 1614)  . albumin human    . ceFEPime (MAXIPIME) IV 2 g (05/10/20 13240635)  . feeding supplement (VITAL 1.5 CAL) 1,000 mL (05/08/20 1129)     Objective: Vitals:   05/09/20 2332 05/10/20 0415 05/10/20 0500 05/10/20 0818  BP: (!) 104/54 (!) 106/59  (!) 97/57  Pulse: 83 87  92  Resp: (!) 26 19  (!) 21  Temp: 98.5 F (36.9 C) 98.2 F (36.8 C)  98.5 F (36.9 C)  TempSrc: Oral Oral   Oral  SpO2: 95% 97%  100%  Weight:   91.8 kg   Height:        Intake/Output Summary (Last 24 hours) at 05/10/2020 [id number] Last data filed at 05/10/2020 0300 Gross per 24 hour  Intake 1100 ml  Output 400 ml  Net 700 ml   Filed Weights   05/09/20 0500 05/09/20 0841 05/10/20 0500  Weight: 89.8 kg 89.8 kg 91.8 kg    Examination:  General: A/O x2 (does not know where, when), does know that he is in a hospital No acute respiratory distress Eyes: negative scleral hemorrhage, negative anisocoria, positive icterus ENT: Negative Runny nose, negative gingival bleeding, Neck:  Negative scars, masses, torticollis, lymphadenopathy, JVD Lungs: Clear to auscultation bilaterally without wheezes or crackles Cardiovascular: Regular rate and rhythm without murmur gallop or rub normal S1 and S2 Abdomen: negative abdominal pain, positive distention, positive soft, bowel sounds, no rebound, positive  ascites, no appreciable mass Extremities: No significant cyanosis, clubbing, or edema bilateral lower extremities Skin: Negative rashes, lesions, ulcers,positive jaundice Psychiatric:  Negative depression, negative anxiety, negative fatigue, negative mania  Central nervous system:  Cranial nerves II through XII intact, tongue/uvula midline, all extremities muscle strength 5/5, sensation intact throughout, finger nose finger bilateral within normal limits, quick finger touch bilateral within normal limits, negative Romberg sign, heel to shin bilateral within normal limits, standing on 1 foot bilateral within normal limits, walking on tiptoes within normal limits, walking on heels within normal limits, negative dysarthria, negative expressive aphasia, negative receptive aphasia.  .     Data Reviewed: Care during the described time interval was provided by me .  I have reviewed this patient's available data, including medical history, events of note, physical examination, and all test results as part of my  evaluation.  CBC: Recent Labs  Lab 05/07/20 0416 05/07/20 0416 05/07/20 1815 05/07/20 2227 05/08/20 0347 05/08/20 0347 05/08/20 0943 05/08/20 1548 05/09/20 0143 05/09/20 1149 05/10/20 0334  WBC 38.9*  --  40.1*  --  35.3*  --   --   --   --  31.8* 27.7*  HGB 10.2*   < > 9.6*   < > 8.6*   < > 9.4* 10.0* 9.2* 9.8* 9.8*  HCT 28.7*   < > 27.5*   < > 24.3*   < > 26.8* 28.1* 25.9* 27.8* 27.5*  MCV 104.7*  --  105.4*  --  106.1*  --   --   --   --  107.3* 106.6*  PLT 108*  --  125*  --  131*  --   --   --   --  181 180   < > = values in this interval not displayed.   Basic Metabolic Panel: Recent Labs  Lab 05/04/20 0406 05/04/20 0406 05/04/20 1650 05/05/20 0553 05/05/20 0553 05/06/20 0500 05/07/20 0416 05/08/20 0347 05/09/20 1149 05/10/20 0334  NA 132*   < >  --  135   < > 142 140 137 132* 130*  K 3.6   < >  --  3.1*   < > 2.8* 3.9 4.2 3.8 3.9  CL 99   < >  --  106   < > 110 111 108 106 102  CO2 21*   < >  --  19*   < > 19* 18* 19* 17* 15*  GLUCOSE 121*   < >  --  138*   < > 138* 165* 138* 119* 94  BUN 66*   < >  --  76*   < > 67* 65* 76* 73* 77*  CREATININE 3.43*   < >  --  3.00*   < > 2.19* 1.98* 2.09* 2.13* 2.51*  CALCIUM 6.8*   < >  --  7.0*   < > 7.7* 7.8* 7.7* 7.7* 7.8*  MG  --   --  2.4 2.4  --  2.3  --   --   --   --   PHOS 3.3  --  3.7 4.4  --  3.2  --   --   --   --    < > = values in this interval not displayed.   GFR: Estimated Creatinine Clearance: 41.3 mL/min (A) (by C-G formula based on SCr of 2.51 mg/dL (H)). Liver Function Tests: Recent Labs  Lab 05/06/20 0500 05/07/20 0416 05/08/20 0347 05/09/20 1149 05/10/20 0334  AST 139* 160* 170* 158* 170*  ALT 75* 86* 92* 108* 102*  ALKPHOS 168* 161* 129* 188* 162*  BILITOT 34.6* 36.8* 31.0* 30.7* 29.8*  PROT 6.0* 6.1* 5.6* 5.5* 5.6*  ALBUMIN 2.2* 2.0* 2.2* 1.9* 1.7*   No results for input(s): LIPASE, AMYLASE in the last 168 hours. Recent Labs  Lab 05/03/20 2016 05/05/20 0553 05/06/20 0500    AMMONIA 111* 77* 62*   Coagulation Profile: Recent Labs  Lab 05/03/20 2016 05/04/20 0406 05/07/20 1815  INR 2.3* 2.2* 2.0*   Cardiac Enzymes: No results for input(s): CKTOTAL, CKMB, CKMBINDEX, TROPONINI in the last 168 hours. BNP (last 3 results) No results for input(s): PROBNP in the last 8760 hours. HbA1C: Recent Labs    05/09/20 0143  HGBA1C 4.2*   CBG: Recent Labs  Lab 05/09/20 0753 05/09/20 1221 05/09/20 2106 05/10/20 0020 05/10/20 0413  GLUCAP 130* 118* 115* 97 88   Lipid Profile: No results for input(s): CHOL, HDL, LDLCALC, TRIG, CHOLHDL, LDLDIRECT in the last 72 hours. Thyroid Function Tests: No results for input(s): TSH, T4TOTAL, FREET4, T3FREE, THYROIDAB in the last 72 hours. Anemia Panel: No results for input(s): VITAMINB12, FOLATE, FERRITIN, TIBC, IRON, RETICCTPCT in the last 72 hours. Sepsis Labs: No results for input(s): PROCALCITON, LATICACIDVEN in the last 168 hours.  Recent Results (from the past 240 hour(s))  MRSA PCR Screening     Status: None   Collection Time: 04/30/20  8:35 PM   Specimen: Nasopharyngeal  Result Value Ref Range Status   MRSA by PCR NEGATIVE NEGATIVE Final    Comment:        The GeneXpert MRSA Assay (FDA approved  for NASAL specimens only), is one component of a comprehensive MRSA colonization surveillance program. It is not intended to diagnose MRSA infection nor to guide or monitor treatment for MRSA infections. Performed at [facility], [address], [place], [place] [address]   Gram stain     Status: None   Collection Time: 05/03/20  1:10 PM   Specimen: Abdomen; Peritoneal Fluid  Result Value Ref Range Status   Specimen Description ABDOMEN  Final   Special Requests PERITONEAL FLUID  Final   Gram Stain   Final    WBC PRESENT,BOTH PMN AND MONONUCLEAR NO ORGANISMS SEEN CYTOSPIN SMEAR Performed at [facility], [address] [place], [place] [address]    Report Status 05/03/2020 FINAL  Final   Culture, body fluid-bottle     Status: None   Collection Time: 05/03/20  1:10 PM   Specimen: Peritoneal Washings  Result Value Ref Range Status   Specimen Description PERITONEAL FLUID  Final   Special Requests ABDOMEN  Final   Culture   Final    NO GROWTH 5 DAYS Performed at [facility], [address], [place], [place] [address]    Report Status 05/08/2020 FINAL  Final  Urine Culture     Status: None   Collection Time: 05/04/20  5:23 PM   Specimen: Urine, Clean Catch  Result Value Ref Range Status   Specimen Description URINE, RANDOM  Final   Special Requests NONE  Final   Culture   Final    NO GROWTH Performed at [facility], [address] [place], [place] [address]    Report Status 05/05/2020 FINAL  Final  Culture, blood (routine x 2)     Status: None   Collection Time: 05/05/20  7:53 AM   Specimen: BLOOD  Result Value Ref Range Status   Specimen Description BLOOD LEFT ANTECUBITAL  Final   Special Requests   Final    BOTTLES DRAWN AEROBIC AND ANAEROBIC Blood Culture results may not be optimal due to an inadequate volume of blood received in culture bottles   Culture   Final    NO GROWTH 5 DAYS Performed at [facility], [address], [place], [place] [address]    Report Status 05/10/2020 FINAL  Final  Culture, blood (routine x 2)     Status: None   Collection Time: 05/05/20  7:57 AM   Specimen: BLOOD LEFT HAND  Result Value Ref Range Status   Specimen Description BLOOD LEFT HAND  Final   Special Requests   Final    BOTTLES DRAWN AEROBIC AND ANAEROBIC Blood Culture adequate volume   Culture   Final    NO GROWTH 5 DAYS Performed at [facility], [address] [place], [place] [address]    Report Status 05/10/2020 FINAL  Final  Gram stain     Status: None   Collection Time: 05/07/20 10:27 AM   Specimen: Abdomen; Peritoneal Fluid  Result Value Ref Range Status   Specimen Description PERITONEAL FLUID  Final   Special Requests NONE   Final   Gram Stain   Final    WBC PRESENT, PREDOMINANTLY PMN NO ORGANISMS SEEN CYTOSPIN SMEAR Performed at [facility], [address] [place], [place] [address]    Report Status 05/07/2020 FINAL  Final  Culture, body fluid-bottle     Status: None (Preliminary result)   Collection Time: 05/07/20 10:27 AM   Specimen: Peritoneal Washings  Result Value Ref Range Status   Specimen Description PERITONEAL FLUID  Final   Special Requests NONE  Final   Culture   Final    NO GROWTH 3 DAYS Performed at [facility], [address], [place], [place] [address]    Report Status PENDING  Incomplete         Radiology Studies: No results found.      Scheduled Meds: . Chlorhexidine Gluconate Cloth  6 each Topical Q0600  . feeding supplement (PROSource TF)  45 mL Per Tube BID  . insulin aspart  0-6 Units Subcutaneous TID WC  . lactulose  20 g Oral TID  . LORazepam  0.25 mg Oral Q8H  . mouth rinse  15 mL Mouth Rinse BID  . midodrine  15 mg Oral TID WC  . pantoprazole (PROTONIX) IV  40 mg Intravenous Q12H  . prednisoLONE  30 mg Oral Q breakfast  . rifaximin  550 mg Oral BID  . sodium bicarbonate  1,300 mg Oral TID  . sodium chloride flush  10-40 mL Intracatheter Q12H  . thiamine  100 mg Oral Daily   Continuous Infusions: . sodium chloride Stopped (04/30/20 1614)  . albumin human    . ceFEPime (MAXIPIME) IV 2 g (05/10/20 8546)  . feeding supplement (VITAL 1.5 CAL) 1,000 mL (05/08/20 1129)     LOS: 13 days    Time spent:40 min    Sadaf Przybysz, Roselind Messier, MD [facility] Pager [phone]  If 7PM-7AM, please contact night-coverage [email or web address] Password [facility] 05/10/2020, 8:26 AM

## 2020-05-10 NOTE — Progress Notes (Signed)
From the bleeding standpoint, things seem to have settled down.  BUN has been essentially stable over the past couple of days, and the patient's hemoglobin is similarly stable over the past 48 hours. The patient's nurse indicates he had a nonbloody appearing stool yesterday. He has been off octreotide for approximately 24 hours, following yesterday's endoscopy that showed no bleeding, but was pertinent for the presence of portal gastropathy.  However, the patient continues to show a mixed picture metabolically: His bilirubin has drifted down to 30 and is staying there, on prednisolone for presumed alcoholic hepatitis; unfortunately, his creatinine is starting to rise again, 2.51 this morning.  According to the patient's nurse, he is eating moderately well.  On examination, he is awake and alert, and is oriented to year but not month ("August").  Plan:  1. Change diet to 2 g sodium to help limit fluid retention  2. Dietary instruction in 2 g sodium diet (particularly with patient's mother)  Florencia Reasons, M.D. Pager [phone] If no answer or after 5 PM call [phone]

## 2020-05-10 NOTE — Progress Notes (Signed)
   05/10/20 1253  Clinical Encounter Type  Visited With Patient and family together  Visit Type Initial;Spiritual support  Referral From Palliative care team  Consult/Referral To Chaplain  This chaplain followed up with Pt. spiritual care as requested by the Pt. mother-Marsha.  The chaplain introduced herself to the Pt. and the role of spiritual care on the PMT.  The Pt. mother-Marsha is sitting bedside with the Pt.  The Pt.articulated his willingness to talk to the chaplain. The Pt. immediately communicated his desire to go home.  The Pt. added going home will be fantastic, "the opportunity to lay on the couch and watch his daughter play video games".  The chaplain attempted to get clarity around the Pt. meaning of going home. The Pt. mother interjected the Pt. health may change if he goes home. The chaplain asked the Pt. if he wanted the medical team to give him more information about his choices in healthcare.  The Pt. responded yes.  The Pt. updated the RN-Kim, Dr. Joseph Art, and the PMT.  Mindi Junker requested time with the chaplain outside the Pt. room.  Mindi Junker questioned the Pt. ability to make a healthcare decision with his depression.  Mindi Junker stated she is aware the Pt. health may decline at home.  The chaplain affirmed the time with the medical team fulfills the Pt. request to understand his choices and how the Pt. choices effect the Pt. quality of life. Mindi Junker desires to be present at the meeting and requests Dr. Matthias Hughs presence.

## 2020-05-11 ENCOUNTER — Inpatient Hospital Stay (HOSPITAL_COMMUNITY): Payer: Medicaid Other

## 2020-05-11 DIAGNOSIS — Z7189 Other specified counseling: Secondary | ICD-10-CM

## 2020-05-11 HISTORY — PX: IR PARACENTESIS: IMG2679

## 2020-05-11 LAB — PROTEIN, PLEURAL OR PERITONEAL FLUID: Total protein, fluid: <3 g/dL

## 2020-05-11 LAB — CBC WITH DIFFERENTIAL/PLATELET
Abs Immature Granulocytes: 0.32 10*3/uL — ABNORMAL HIGH (ref 0.00–0.07)
Basophils Absolute: 0.1 10*3/uL (ref 0.0–0.1)
Basophils Relative: 0 %
Eosinophils Absolute: 0 10*3/uL (ref 0.0–0.5)
Eosinophils Relative: 0 %
HCT: 26.6 % — ABNORMAL LOW (ref 39.0–52.0)
Hemoglobin: 9.6 g/dL — ABNORMAL LOW (ref 13.0–17.0)
Immature Granulocytes: 1 %
Lymphocytes Relative: 3 %
Lymphs Abs: 0.7 10*3/uL (ref 0.7–4.0)
MCH: 38.2 pg — ABNORMAL HIGH (ref 26.0–34.0)
MCHC: 36.1 g/dL — ABNORMAL HIGH (ref 30.0–36.0)
MCV: 106 fL — ABNORMAL HIGH (ref 80.0–100.0)
Monocytes Absolute: 2.2 10*3/uL — ABNORMAL HIGH (ref 0.1–1.0)
Monocytes Relative: 8 %
Neutro Abs: 23.5 10*3/uL — ABNORMAL HIGH (ref 1.7–7.7)
Neutrophils Relative %: 88 %
Platelets: 191 10*3/uL (ref 150–400)
RBC: 2.51 MIL/uL — ABNORMAL LOW (ref 4.22–5.81)
RDW: 21 % — ABNORMAL HIGH (ref 11.5–15.5)
WBC: 26.8 10*3/uL — ABNORMAL HIGH (ref 4.0–10.5)
nRBC: 0 % (ref 0.0–0.2)

## 2020-05-11 LAB — GRAM STAIN

## 2020-05-11 LAB — GLUCOSE, CAPILLARY
Glucose-Capillary: 125 mg/dL — ABNORMAL HIGH (ref 70–99)
Glucose-Capillary: 128 mg/dL — ABNORMAL HIGH (ref 70–99)

## 2020-05-11 LAB — LACTATE DEHYDROGENASE, PLEURAL OR PERITONEAL FLUID: LD, Fluid: 170 U/L — ABNORMAL HIGH (ref 3–23)

## 2020-05-11 LAB — GLUCOSE, PLEURAL OR PERITONEAL FLUID: Glucose, Fluid: 142 mg/dL

## 2020-05-11 LAB — HEPATITIS C GENOTYPE

## 2020-05-11 LAB — AMYLASE, PLEURAL OR PERITONEAL FLUID: Amylase, Fluid: 7 U/L

## 2020-05-11 LAB — ALBUMIN, PLEURAL OR PERITONEAL FLUID: Albumin, Fluid: <1 g/dL

## 2020-05-11 LAB — MAGNESIUM: Magnesium: 2.1 mg/dL (ref 1.7–2.4)

## 2020-05-11 LAB — BODY FLUID CELL COUNT WITH DIFFERENTIAL
Eos, Fluid: 0 %
Lymphs, Fluid: 7 %
Monocyte-Macrophage-Serous Fluid: 11 % — ABNORMAL LOW (ref 50–90)
Neutrophil Count, Fluid: 82 % — ABNORMAL HIGH (ref 0–25)
Total Nucleated Cell Count, Fluid: 4600 cu mm — ABNORMAL HIGH (ref 0–1000)

## 2020-05-11 LAB — PHOSPHORUS: Phosphorus: 8.8 mg/dL — ABNORMAL HIGH (ref 2.5–4.6)

## 2020-05-11 MED ORDER — LORAZEPAM 0.5 MG PO TABS
0.5000 mg | ORAL_TABLET | Freq: Three times a day (TID) | ORAL | Status: DC | PRN
  Administered 2020-05-12: 0.5 mg via ORAL
  Filled 2020-05-11: qty 1

## 2020-05-11 MED ORDER — LIDOCAINE HCL (PF) 1 % IJ SOLN
INTRAMUSCULAR | Status: DC | PRN
  Administered 2020-05-11: 10 mL

## 2020-05-11 MED ORDER — LIDOCAINE HCL 1 % IJ SOLN
INTRAMUSCULAR | Status: AC
  Filled 2020-05-11: qty 20

## 2020-05-11 MED ORDER — ALBUMIN HUMAN 25 % IV SOLN
50.0000 g | Freq: Once | INTRAVENOUS | Status: AC
  Administered 2020-05-11: 12.5 g via INTRAVENOUS
  Filled 2020-05-11: qty 200

## 2020-05-11 MED ORDER — HYDROMORPHONE HCL 1 MG/ML IJ SOLN
0.5000 mg | INTRAMUSCULAR | Status: DC | PRN
  Administered 2020-05-11 – 2020-05-12 (×2): 1 mg via INTRAVENOUS
  Filled 2020-05-11 (×2): qty 1

## 2020-05-11 NOTE — Progress Notes (Signed)
[facility] [place] [facility] Referral received via Palliative Care NP.  Reviewed chart to evaluate for of hospice services at home. Pt has been tentatively approved to go home with hospice services after I am able to discuss with the pt and mother goals of care and hospice philosophy. If both are in agreement with this then we will be able to proceed with hospice services at d/c.   I have reach out to setup meeting time with mother Mindi Junker and I am awaiting a return call.  Norm Parcel RN [phone]

## 2020-05-11 NOTE — Progress Notes (Signed)
Patient's renal function shows significant deterioration compared to yesterday.  However, his bilirubin is stable, and his hemoglobin is stable, now about 48 hours off octreotide.  MELD score re-calculated today is 39, up slightly from a week ago and indicative of a >50% chance of death in the next 3 mos.  Pt is awake, uncomfortable.  Discussed briefly with his mother and with Dr. Joseph Art.  Recommend:  1.  Large-volume paracentesis for patient comfort and to assess response of (presumed) SBP to antbx  2. Recheck labs tomorrow; if adverse trends continue, I think that the clinical picture would be sufficiently grave to merit further discussion of Hospice care.  Florencia Reasons, M.D. Pager [phone] If no answer or after 5 PM call [phone]

## 2020-05-11 NOTE — Progress Notes (Signed)
Patient is off the floor for test.

## 2020-05-11 NOTE — Progress Notes (Signed)
Daily Progress Note   Patient Name: Alan Becker       Date: 05/11/2020 DOB: [birth year]  Age: 44 y.o. MRN#: [id number] Attending Physician: Allie Bossier, MD Primary Care Physician: Janith Lima, MD Admit Date: 04/27/2020  Reason for Consultation/Follow-up: Establishing goals of care  Subjective: Patient asking when he can go home. Minimally interactive. Parents at bedside.   Length of Stay: 14  Current Medications: Scheduled Meds:  . (feeding supplement) PROSource Plus  30 mL Oral TID BM  . Chlorhexidine Gluconate Cloth  6 each Topical Q0600  . feeding supplement  1 Container Oral TID BM  . folic acid  1 mg Oral Daily  . insulin aspart  0-6 Units Subcutaneous TID WC  . lactulose  20 g Oral TID  . LORazepam  0.25 mg Oral Q8H  . mouth rinse  15 mL Mouth Rinse BID  . midodrine  15 mg Oral TID WC  . pantoprazole (PROTONIX) IV  40 mg Intravenous Q12H  . prednisoLONE  30 mg Oral Q breakfast  . rifaximin  550 mg Oral BID  . sodium bicarbonate  1,300 mg Oral TID  . sodium chloride flush  10-40 mL Intracatheter Q12H  . thiamine  100 mg Oral Daily    Continuous Infusions: . sodium chloride Stopped (04/30/20 1614)  . albumin human    . albumin human    . ceFEPime (MAXIPIME) IV 2 g (05/11/20 0502)    PRN Meds: albuterol, alum & mag hydroxide-simeth, HYDROmorphone (DILAUDID) injection, ondansetron (ZOFRAN) IV, polyethylene glycol, prochlorperazine, simethicone, sodium chloride flush  Physical Exam Constitutional:      Comments: lethargic  Pulmonary:     Effort: Pulmonary effort is normal.  Skin:    General: Skin is warm and dry.  Neurological:     Comments: Unclear, minimally verbal - expresses desire to go home             Vital Signs: BP 93/65 (BP Location: Right Arm)   Pulse 76    Temp 97.7 F (36.5 C) (Oral)   Resp (!) 30   Ht '5\' 8"'$  (1.727 m)   Wt 90.6 kg   SpO2 99%   BMI 30.37 kg/m  SpO2: SpO2: 99 % O2 Device: O2 Device: Room Air O2 Flow Rate: O2 Flow Rate (L/min): 3 L/min  Intake/output summary:   Intake/Output Summary (Last 24 hours) at 05/11/2020 1425 Last data filed at 05/11/2020 0502 Gross per 24 hour  Intake --  Output 125 ml  Net -125 ml   LBM: Last BM Date: 05/10/20 Baseline Weight: Weight: 79.4 kg Most recent weight: Weight: 90.6 kg       Palliative Assessment/Data: PPS 40%    Flowsheet Rows     Most Recent Value  Intake Tab  Referral Department Hospitalist  Unit at Time of Referral Intermediate Care Unit  Palliative Care Primary Diagnosis Other (Comment)  [cirrhosis]  Date Notified 05/04/20  Palliative Care Type New Palliative care  Reason for referral Clarify Goals of Care  Date of Admission 04/27/20  Date first seen by Palliative Care 05/05/20  # of days Palliative referral response time 1 Day(s)  # of days IP prior to Palliative referral 7  Clinical Assessment  Psychosocial & Spiritual Assessment  Palliative Care Outcomes      Patient Active Problem List   Diagnosis Date Noted  . SBP (spontaneous bacterial peritonitis) ([place]) 05/10/2020  . Abdominal distension   . Abnormal transaminases   . Ascites   . Chronic hepatitis C with hepatic coma ([place]) 05/07/2020  . Acute liver failure without hepatic coma   . Encephalopathy acute   . DNR (do not resuscitate)   . AKI (acute kidney injury) ([place])   . Palliative care encounter   . Hepatorenal syndrome ([place]) 04/27/2020  . Drug-induced erectile dysfunction 06/03/2018  . Thiamine deficiency 04/26/2018  . Alcohol use disorder, severe, dependence ([place]) 04/21/2018  . Routine general medical examination at a health care facility 04/21/2018  . Vitamin D insufficiency 04/21/2018  . Essential hypertension 01/12/2015  . Depression with anxiety 01/12/2015    Palliative Care  Assessment & Plan   HPI: 44 y.o.malewith past medical history of alcohol abuse, severe depression, HTNwho was admitted on 7/8/2021with confusion, weakness and changes in skin color. Per his mother the patient was a heavy drinker for the past 8 years but had stopped drinking 3 - 4 weeks ago. Per admission H&P: Work-up in the ED consistent with acute renal failure creatinine of 8.5, albumin 1.9, bilirubin of 23, AST elevated and white count of 15. 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 On 7/9 he was a started on CRRT, GI renal following, underwent paracentesis on 7/8 with 30 cc of dark yellow fluid which was negative for SBP. He underwent repeated paracentesis on 7/14 yielding 3 L of fluid. Patient developedworsening encephalopathy on 7/15, not following commands, more lethargic. CCM consulted for further evaluation and concern for decompensation. Plan to transfer to progressive care unit, support care, NG tube placement for medications.  Assessment: Met with parents - [name] and Waunita Schooner in separate area.   They share their understanding of current situation. We were joined by Dr. Sherral Hammers who was able to answer family's questions about current situation. Worsening creatinine and BUN were discussed. Discussed worsening albumin. Discussed concern about limited medical management options moving forward.   Then discussed with parents separately how to move forward with patient's care. The difference between aggressive medical intervention and comfort care was considered in light of the patient's goals of care. Family is motivated to honor patient's specific wish to go home. They also understand that there is limited options moving forward for aggressive medical care. We discuss what focusing on comfort at home would look like with the support of hospice. They would like to speak to hospice liaison about setting up hospice care. Discussed hospice  philosophy of care.   Family does not feel patient understands situation d/t his significant illness. Patient's orientation and capacity to make decisions fluctuates. Parents wish to avoid speaking about hospice in front of patient.   Questions and concerns were addressed. The family was encouraged to call with questions or concerns.    Recommendations/Plan:  Plan for d/[facility] home [facility] - discussed with hospice liaison and case manager  Family understands poor prognosis  Code Status:  DNR  Prognosis:   weeks  Discharge Planning:  Home with Hospice  Care plan was discussed with Dr. Sherral Hammers, hospice liaison, case management, patient's parents, RN  Thank you for allowing the Palliative Medicine Team to assist in the care of this patient.   Total Time 50 minutes Prolonged Time Billed  no  Greater than 50%  of this time was spent counseling and coordinating care related to the above assessment and plan.  Juel Burrow, DNP, [facility] Palliative Medicine Team Team Phone # [phone]  Pager [phone]

## 2020-05-11 NOTE — Progress Notes (Signed)
Physical Therapy Treatment Patient Details Name: Alan Becker MRN: [id number] DOB: [birth year] Today's Date: 05/11/2020    History of Present Illness 44 year old with past medical history significant for alcohol abuse who was brought to the ED by family who noticed that he has been very confused and having skin color changes.Work-up in the ED consistent with acute renal failure.  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  On 7/9 he was a started on CRRT, GI renal following, underwent paracentesis on 7/8 with 30 cc of dark yellow fluid which was negative for SBP.  His care [facility] on 7/13.  He underwent repeated paracentesis on 7/14 results pending.    PT Comments    Pt continues to fatigue rapidly although able to ambulate for increased distance this session. Pt does express that he is having  "mental pain" and "I just want to go home" multiple times during session. Pt is generally weak and continues to require physical assistance for all mobility tasks at this time. Pt will continue to benefit from acute PT POC to improve activity tolerance and reduce caregiver burden.   Follow Up Recommendations  SNF;Supervision/Assistance - 24 hour (or HHPT pending pt and family goals of care)     Equipment Recommendations  Rolling walker with 5" wheels;3in1 (PT)    Recommendations for Other Services       Precautions / Restrictions Precautions Precautions: Fall Precaution Comments: very large distended abdomen Restrictions Weight Bearing Restrictions: No    Mobility  Bed Mobility Overal bed mobility: Needs Assistance Bed Mobility: Rolling;Sidelying to Sit Rolling: Min assist Sidelying to sit: Min assist          Transfers Overall transfer level: Needs assistance Equipment used: 1 person hand held assist Transfers: Sit to/from [employer] Sit to Stand: Min assist   Squat pivot transfers: Min  assist        Ambulation/Gait Ambulation/Gait assistance: Min assist Gait Distance (Feet): 15 Feet Assistive device: 1 person hand held assist Gait Pattern/deviations: Wide base of support;Step-to pattern;Trunk flexed Gait velocity: reduced Gait velocity interpretation: <1.31 ft/sec, indicative of household ambulator General Gait Details: pt with short step to gait, increased lateral sway, reports just wanting to go home   Stairs             Wheelchair Mobility    Modified Rankin (Stroke Patients Only)       Balance Overall balance assessment: Needs assistance Sitting-balance support: Feet supported;Single extremity supported Sitting balance-Leahy Scale: Poor Sitting balance - Comments: reliant on unilateral UE support   Standing balance support: Single extremity supported Standing balance-Leahy Scale: Poor Standing balance comment: reliant on UE support                            Cognition Arousal/Alertness: Awake/alert Behavior During Therapy: Flat affect Overall Cognitive Status: Impaired/Different from baseline Area of Impairment: Safety/judgement;Awareness;Problem solving                         Safety/Judgement: Decreased awareness of deficits Awareness: Emergent Problem Solving: Slow processing        Exercises      General Comments General comments (skin integrity, edema, etc.): pt jaundiced, fatigues rapidly with resting RR in 30s and up to 47 with ambulation. Pt does well with cues to take deeper and slowed breaths to bring RR back to baseline  Pertinent Vitals/Pain Pain Assessment:  (pt reports mental pain)    Home Living                      Prior Function            PT Goals (current goals can now be found in the care plan section) Acute Rehab PT Goals Patient Stated Goal: go home Progress towards PT goals: Progressing toward goals (slowly)    Frequency    Min 3X/week      PT Plan  Current plan remains appropriate    Co-evaluation              AM-PAC PT "6 Clicks" Mobility   Outcome Measure  Help needed turning from your back to your side while in a flat bed without using bedrails?: A Little Help needed moving from lying on your back to sitting on the side of a flat bed without using bedrails?: A Little Help needed moving to and from a bed to a chair (including a wheelchair)?: A Little Help needed standing up from a chair using your arms (e.g., wheelchair or bedside chair)?: A Little Help needed to walk in hospital room?: A Little Help needed climbing 3-5 steps with a railing? : Total 6 Click Score: 16    End of Session   Activity Tolerance: Patient limited by fatigue Patient left: in bed;with call bell/phone within reach;with bed alarm set Nurse Communication: Mobility status PT Visit Diagnosis: Other abnormalities of gait and mobility (R26.89);Muscle weakness (generalized) (M62.81)     Time: 2355-7322 PT Time Calculation (min) (ACUTE ONLY): 17 min  Charges:  $Gait Training: 8-22 mins                     Arlyss Gandy, PT, DPT Acute Rehabilitation Pager: [phone]    Arlyss Gandy 05/11/2020, 9:15 AM

## 2020-05-11 NOTE — Procedures (Signed)
PROCEDURE SUMMARY:  Patient brought to radiology accompanied by his mother.  Although it appears his orientation flucuates based on chart review, patient answers all orientation questions correctly in IR and tells me he remembers paracentesis this weekend. His mother at bedside confirms he appears to be mentating well at this time and indicates she feels he is appropriate to sign his own consent.   Also note patient is considering transitioning home with hospice.  A PleurX could be considered in the future should he officially discharge home with hospice. This was not discussed with the patient or his mother as it does not appear a final plan is in place.  Plan to proceed with paracentesis today.   Successful US guided paracentesis from right lateral abdomen.  Yielded 2.2 liters of bright yellow fluid.  No immediate complications.  Pt tolerated well.   Specimen was sent for labs.  EBL < 3mL  Hoyt Koch PA-C 05/11/2020 3:18 PM

## 2020-05-11 NOTE — TOC Progression Note (Signed)
Transition of Care (TOC) - Progression Note  Marvetta Gibbons RN,BSN Transitions of Care Unit 4NP (non trauma) - RN Case Manager See Treatment Team for direct Phone #    Patient Details  Name: Alan Becker MRN: [id number] Date of Birth: 02-01-1976  Transition of Care [facility]) CM/SW Contact  Dahlia Client, Romeo Rabon, RN Phone Number: 05/11/2020, 4:29 PM  Clinical Narrative:    PC met with family today for [place]- plan to go home with mom with home hospice services - they have selected [facility]- and referral made per [facility] team as per family choice- f/u done with Cheri K. With [facility]- everything is ready for a d/c tomorrow and they will see pt in the home tomorrow afternoon. Pt will need to be discharged by 2pm if possible. Plan to transport by [place] home.  Mom's address is: [place], [place] [address] Per [facility] DME has been ordered- RW and shower chair for home.  Pt will need GOLD DNR for transport.   Expected Discharge Plan: Skilled Nursing Facility Barriers to Discharge: Barriers Resolved  Expected Discharge Plan and Services Expected Discharge Plan: [place] In-house Referral: Hospice / Palliative Care Discharge Planning Services: CM Consult Post Acute Care Choice: Hospice Living arrangements for the past 2 months: Apartment                 DME Arranged: Walker rolling, shower chair         HH Arranged: Disease Management (Hospice) [place]: [facility] Date [place]: 05/11/20 Time [place]: 1625 Representative spoke with at [place]: [place] Determinants of Health ([place]) Interventions    Readmission Risk Interventions No flowsheet data found.

## 2020-05-11 NOTE — Progress Notes (Signed)
[facility] Progress Note  Subjective:    No acute events, worsening hyponatremia as well as renal function.  No complaints from patient time.  Family having a meeting with palliative care this a.m.   Vitals:   05/11/20 0500 05/11/20 0800 05/11/20 0818 05/11/20 1152  BP:  96/69 95/62 93/65   Pulse:  70 70 76  Resp:  20 20 (!) 30  Temp:   97.6 F (36.4 C) 97.7 F (36.5 C)  TempSrc:   Axillary Oral  SpO2:  98% 98% 99%  Weight: 90.6 kg     Height:        Exam: Gen: NAD SKIN: jaundiced EYES icteric sclera ENT: MMM CV: RRR, s1s2 Lungs: clear ant, normal wob, unlabored Abd: soft, distended, nontender Extr: no edema Neuro: awake, speech clear and coherent     Recent Labs  Lab 05/06/20 0500 05/07/20 0416 05/10/20 0334 05/11/20 0352  K 2.8*   < > 3.9 4.2  BUN 67*   < > 77* 98*  CREATININE 2.19*   < > 2.51* 3.61*  CALCIUM 7.7*   < > 7.8* 7.5*  PHOS 3.2  --   --  8.8*  HGB 10.8*   < > 9.8* 9.6*   < > = values in this interval not displayed.   Inpatient medications: . (feeding supplement) PROSource Plus  30 mL Oral TID BM  . Chlorhexidine Gluconate Cloth  6 each Topical Q0600  . feeding supplement  1 Container Oral TID BM  . folic acid  1 mg Oral Daily  . insulin aspart  0-6 Units Subcutaneous TID WC  . lactulose  20 g Oral TID  . LORazepam  0.25 mg Oral Q8H  . mouth rinse  15 mL Mouth Rinse BID  . midodrine  15 mg Oral TID WC  . pantoprazole (PROTONIX) IV  40 mg Intravenous Q12H  . prednisoLONE  30 mg Oral Q breakfast  . rifaximin  550 mg Oral BID  . sodium bicarbonate  1,300 mg Oral TID  . sodium chloride flush  10-40 mL Intracatheter Q12H  . thiamine  100 mg Oral Daily   . sodium chloride Stopped (04/30/20 1614)  . albumin human    . albumin human    . ceFEPime (MAXIPIME) IV 2 g (05/11/20 0502)   albuterol, alum & mag hydroxide-simeth, HYDROmorphone (DILAUDID) injection, ondansetron (ZOFRAN) IV, polyethylene glycol, prochlorperazine,  simethicone, sodium chloride flush   Assessment/Plan: 1.  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 Other possibility is bile- induced tubular injury/ ATN. Urine lytes c/w HRS, FeNa < 1%. Also decreased oral intake, NSAIDs and recent ARB use could have contributed. - Cr rose to 2.5, was in a plateau phase, will continue to monitor for now  - weaned off levo 7/10; Sp CRRT 7/9- 7/11  - treated with midodrine, octreotide, albumin for HRS   -  Albumin with LVP's if needed in the future - removed Temp HD cath 7/17 -No indication for renal replacement therapy currently, however he is not a candidate for long-term dialysis especially in light of his already poor prognosis from a liver standpoint (especially as he is not a liver transplant candidate).  Meeting with family along with palliative care for potential home with hospice.  2.  Acute decompensated alcoholic hepatitis: On steroids. Rifaximin for sbp ppx  3.  Hepatic encephalopathy:  On lactulose. Per GI and TRH  4.  Hypotension, stable/improved: Cont midodrine 15mg  tid  5. Metabolic acidosis, secondary to renal injury+decreased lactate  clearance from liver disease -Start sodium bicarbonate 650 mg 3 times daily  6. Possible bacterial peritonitis -cx's neg, on cefepime. Id following  7. Hyponatremia -would recommend limiting free water intake to 1L/day for now. Slightly hypervolemic on exam but would hold off on diureses. Splanchnic vasodilatation in the setting of cirrhosis in itself can lead to an ADH response leading to hypoNa.  This in itself worsens his prognosis  Discussed with primary service.  Anthony Sar, MD [facility]

## 2020-05-11 NOTE — Progress Notes (Signed)
CRITICAL VALUE ALERT  Critical Value:  29.1 Bilirubin  Date & Time Notied: 05/11/2020 0550  Provider Notified: M.Katherina Right APP  Orders Received/Actions taken: No new orders at this time   Denver Faster, RN

## 2020-05-11 NOTE — Progress Notes (Addendum)
PROGRESS NOTE    Alan Becker  LZJ:[id number] DOB: [birth year] DOA: 04/27/2020 PCP: Alan Becker     Brief Narrative:  44 year old WM  PMHx EtOH abuse, Hepatorenal syndrome, Hepatitis C without Hhepatic coma acute liver failure without Hepatic coma, Depression with Anxiety,  brought to the ED by family who noticed that he has been very confused and having skin color changes.  He has a history of depression and has been drinking heavily for the last 8 years since his divorce.  His mother gave most of the history because, patient has been confused.  Mother reporter that patient has been experiencing abdominal bloating and pain, fatigue and malaise for the last 3 to 4 weeks.  Work-up in the ED consistent with acute renal failure creatinine of 8.5, albumin 1.9, bilirubin of 23, AST elevated and white count of 15.  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  On 7/9 he was a started on CRRT, GI, renal following, underwent paracentesis on 7/8 with 30 cc of dark yellow fluid which was negative for SBP.  His care was transferred to [facility] service on 7/13.  He underwent repeated paracentesis on 7/14 yielding 3 L of fluid. And subsequently paracentesis on 7/18 yielding 3.8 L ascites fluids consistent with SBP. Ceftriaxone was change to cefepime. Since then his WBC has started to decline.   Patient develops worsening encephalopathy on 7/15, not following command, more lethargic. CCM consulted for further evaluation and concern for decompensation. Plan to transfer to progressive care unit, support care, NG tube placement for medications and nutrition. CCM recommend support care. Palliative was consulted, patient code status was change to DNR but with plan to continue with agressive care.   His liver function test over last 2 days has remain stable, with Bilirubin trending down form 34 to 30. He develops melena, maroon stool. He was started on octreotide, IV  Protonix was continue. 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 Plan is for discontinuation of octreotide. He required transiently after endoscopy neo synephrine.     Subjective: 7/22 afebrile overnight, worsening respiratory alkalosis compensated.  Patient is sodium also now decreasing which is a poor prognostic indicator. ADDENDUM; received call from [facility], patient now having abdominal discomfort from his distention requesting therapeutic paracentesis.   Assessment & Plan: Covid vaccination; positive vaccination   Active Problems:   Depression with anxiety   Alcohol use disorder, severe, dependence ([place])   Hepatorenal syndrome ([place])   AKI (acute kidney injury) ([place])   Palliative care encounter   Acute liver failure without hepatic coma   Encephalopathy acute   DNR (do not resuscitate)   Chronic hepatitis C with hepatic coma ([place])   Abdominal distension   Abnormal transaminases   Ascites   SBP (spontaneous bacterial peritonitis) ([place])   Acute Renal Failure/Hepatorenal syndrome  -Nephrology consulted and following. -Patient was a started on midodrine/octreotide/albumin during earlier course of hospitalization.  -No acute indication for dialysis, plan to monitor renal function and urine output for the next several days. -HD catheter removed 7/17  Recent Labs  Lab 05/07/20 0416 05/08/20 0347 05/09/20 1149 05/10/20 0334 05/11/20 0352  CREATININE 1.98* 2.09* 2.13* 2.51* 3.61*  -Worsening renal function -7/22 discussed case with [facility] concurs that there is no place for HD for patient, in fact studies show that placing this type of patient on HD would increase his mortality.  Active decompensated EtOH liver cirrhosis -[facility] consulted.  Alan Becker saw the patient earlier during this admission. -Patient was a started on prednisone, his discriminate function was 50 -Patient deemed NOT a liver transplant candidate  secondary EtOH use  -Symptomatic ascites.  Paracentesis x3 during his hospitalization.  Currently no indication for additional paracentesis.  Started on antibiotics -7/18 paracentesis fluid consistent with SBP -Lactulose 20 g TID -Prednisolone 30 mg daily -Rifaximin 550 mg BID -Pentoxifylline (held) associated bleeding risk -Viral hepatitis panel; hepatitis C antibody positive, RNA negative -7/22 LFTs stable but elevated  Results for Alan Becker, Alan Becker (MRN [id number]) as of 05/11/2020 11:10  Ref. Range 05/07/2020 04:16 05/08/2020 03:47 05/09/2020 11:49 05/10/2020 03:34 05/11/2020 03:52  Total Bilirubin Latest Ref Range: 0.3 - 1.2 mg/dL 36.8 (HH) 31.0 (HH) 30.7 (HH) 29.8 (HH) 29.1 (HH)    Acute SBP -Abdominal fluid from 7/18 ANC 750.  -Complete course of antibiotics .  -Peritoneal fluid NGTD -7/22 patient now having increased abdominal pain secondary to ascites.  Request IR therapeutic paracentesis.  Fluid sent for lab -7/22 Albumin 50 g  Acute respiratory failure with hypoxia -Titrate O2 to maintain SPO2 > 88%   Respiratory alkalosis -7/21 worsening.  Compensated  Leukocytosis:  -Multifactorial steroids, SBP less likely no fever negative bands negative left shift.  To date peritoneal fluid and blood cultures negative -ID consulted; recommend completion of course of current antibiotics.  Hyponatremia;  -Secondary to hepatorenal syndrome -We will monitor closely, any attempt to overtly correct will worsen patient's overall condition. -7/22 worsening hyponatremia poor prognostic indicator  GI, Bleed, melena; concern for portal gastropathy;  -Dr Alan Becker consulted.  -Monitor patient's H/H -Protonix IV 40 mg BID  Long-term alcohol abuse: -Folic acid '1mg'$  daily -Thiamine 100 mg daily   Abnormal liver lesion: -CT abdomen on 7/8 noted questionable focal hypoattenuation mass in the liver not well characterized, -given that patient is requesting to go home no need for further  diagnostic studies.    Major depression:  -Not appropriate for psychotropic med in the setting of decompensated cirrhosis  Hypokalemia;  -Resolved  Nutrition;  Plan to advanced diet today.  If patient is not able to maintain adequate nutrition, he will need NG tube insertion for tube feeding.    Nutrition Problem: Inadequate oral intake -Etiology: poor appetite -Interventions: Ensure Enlive (each supplement provides 350kcal and 20 grams of protein), Education, Prostat  Goals of care -7/21 patient expressing wish to discontinue further treatment/diagnostic studies and go home.  Patient is going to discuss with chaplain, palliative care, and his mother and inform me of his final decision in the next 24 hours.  Patient understands that if he goes home his health will most likely rapidly deteriorate. -7/22 met with parents and palliative care discussed at length patient's current situation.  Counseled parents that no place for HD, liver transplant.  Explained at length that barring a miraculous turnaround of patient's renal function and liver function patient is terminal.  DVT prophylaxis: SCD Code Status: DNR Family Communication: 7/22 mother and father present for palliative care meeting.  Status is: Inpatient    Dispo: The patient is from: Home              Anticipated d/c is to: Home              Anticipated d/c date is: 7/24              Patient currently extremely unstable      Consultants:  GI ID   Procedures/Significant Events:    I have personally  reviewed and interpreted all radiology studies and my findings are as above.  VENTILATOR SETTINGS:    Cultures 7/8 SARS coronavirus negative 7/16 blood LEFT AC negative final 7/16 blood LEFT hand negative final 7/18 peritoneal fluid NGTD    Antimicrobials: Anti-infectives (From admission, onward)   Start     Ordered Stop   05/07/20 1800  ceFEPIme (MAXIPIME) 2 g in sodium chloride 0.9 % 100 mL IVPB      Discontinue     05/07/20 1655 05/11/20 1511   05/04/20 1045  rifaximin (XIFAXAN) tablet 550 mg     Discontinue     05/04/20 0930     05/03/20 1345  cefTRIAXone (ROCEPHIN) 2 g in sodium chloride 0.9 % 100 mL IVPB  Status:  Discontinued        05/03/20 1336 05/07/20 1335   04/28/20 1600  cefTRIAXone (ROCEPHIN) 2 g in sodium chloride 0.9 % 100 mL IVPB  Status:  Discontinued        04/27/20 1731 04/29/20 1635   04/27/20 1500  cefTRIAXone (ROCEPHIN) 2 g in sodium chloride 0.9 % 100 mL IVPB        04/27/20 1449 04/27/20 1726       Devices    LINES / TUBES:      Continuous Infusions: . sodium chloride Stopped (04/30/20 1614)  . albumin human    . ceFEPime (MAXIPIME) IV 2 g (05/11/20 0502)     Objective: Vitals:   05/11/20 0321 05/11/20 0500 05/11/20 0800 05/11/20 0818  BP: 93/65  96/69 95/62  Pulse: 70  70 70  Resp: (!) '27  20 20  '$ Temp: 97.6 F (36.4 C)   97.6 F (36.4 C)  TempSrc: Oral   Axillary  SpO2: 98%  98% 98%  Weight:  90.6 kg    Height:        Intake/Output Summary (Last 24 hours) at 05/11/2020 1028 Last data filed at 05/11/2020 0502 Gross per 24 hour  Intake 10 ml  Output 125 ml  Net -115 ml   Filed Weights   05/09/20 0841 05/10/20 0500 05/11/20 0500  Weight: 89.8 kg 91.8 kg 90.6 kg   Physical Exam:  General: A/O x2 (does not know where, when), does know he is in the hospital, No acute respiratory distress Eyes: negative scleral hemorrhage, negative anisocoria, positive i icterus ENT: Negative Runny nose, negative gingival bleeding, Neck:  Negative scars, masses, torticollis, lymphadenopathy, JVD Lungs: Clear to auscultation bilaterally without wheezes or crackles Cardiovascular: Regular rate and rhythm without murmur gallop or rub normal S1 and S2 Abdomen: negative abdominal pain, positive distention,  positive soft, bowel sounds, no rebound, positive  ascites, no appreciable mass Extremities: No significant cyanosis, clubbing, or edema bilateral  lower extremities Skin: Negative rashes, lesions, ulcers, positive jaundice Psychiatric:  positive  depression, negative anxiety, negative fatigue, negative mania  Central nervous system:  Cranial nerves II through XII intact, tongue/uvula midline, all extremities muscle strength 5/5, sensation intact throughout, negative dysarthria, negative expressive aphasia, negative receptive aphasia.   .     Data Reviewed: Care during the described time interval was provided by me .  I have reviewed this patient's available data, including medical history, events of note, physical examination, and all test results as part of my evaluation.  CBC: Recent Labs  Lab 05/07/20 1815 05/07/20 2227 05/08/20 0347 05/08/20 0943 05/08/20 1548 05/09/20 0143 05/09/20 1149 05/10/20 0334 05/11/20 0352  WBC 40.1*  --  35.3*  --   --   --  31.8* 27.7* 26.8*  NEUTROABS  --   --   --   --   --   --   --   --  23.5*  HGB 9.6*   < > 8.6*   < > 10.0* 9.2* 9.8* 9.8* 9.6*  HCT 27.5*   < > 24.3*   < > 28.1* 25.9* 27.8* 27.5* 26.6*  MCV 105.4*  --  106.1*  --   --   --  107.3* 106.6* 106.0*  PLT 125*  --  131*  --   --   --  181 180 191   < > = values in this interval not displayed.   Basic Metabolic Panel: Recent Labs  Lab 05/04/20 1650 05/05/20 0553 05/05/20 0553 05/06/20 0500 05/06/20 0500 05/07/20 0416 05/08/20 0347 05/09/20 1149 05/10/20 0334 05/11/20 0352  NA  --  135   < > 142   < > 140 137 132* 130* 129*  K  --  3.1*   < > 2.8*   < > 3.9 4.2 3.8 3.9 4.2  CL  --  106   < > 110   < > 111 108 106 102 103  CO2  --  19*   < > 19*   < > 18* 19* 17* 15* 14*  GLUCOSE  --  138*   < > 138*   < > 165* 138* 119* 94 131*  BUN  --  76*   < > 67*   < > 65* 76* 73* 77* 98*  CREATININE  --  3.00*   < > 2.19*   < > 1.98* 2.09* 2.13* 2.51* 3.61*  CALCIUM  --  7.0*   < > 7.7*   < > 7.8* 7.7* 7.7* 7.8* 7.5*  MG 2.4 2.4  --  2.3  --   --   --   --   --  2.1  PHOS 3.7 4.4  --  3.2  --   --   --   --   --  8.8*   <  > = values in this interval not displayed.   GFR: Estimated Creatinine Clearance: 28.6 mL/min (A) (by C-G formula based on SCr of 3.61 mg/dL (H)). Liver Function Tests: Recent Labs  Lab 05/07/20 0416 05/08/20 0347 05/09/20 1149 05/10/20 0334 05/11/20 0352  AST 160* 170* 158* 170* 114*  ALT 86* 92* 108* 102* 89*  ALKPHOS 161* 129* 188* 162* 152*  BILITOT 36.8* 31.0* 30.7* 29.8* 29.1*  PROT 6.1* 5.6* 5.5* 5.6* 5.6*  ALBUMIN 2.0* 2.2* 1.9* 1.7* 1.5*   No results for input(s): LIPASE, AMYLASE in the last 168 hours. Recent Labs  Lab 05/05/20 0553 05/06/20 0500  AMMONIA 77* 62*   Coagulation Profile: Recent Labs  Lab 05/07/20 1815  INR 2.0*   Cardiac Enzymes: No results for input(s): CKTOTAL, CKMB, CKMBINDEX, TROPONINI in the last 168 hours. BNP (last 3 results) No results for input(s): PROBNP in the last 8760 hours. HbA1C: Recent Labs    05/09/20 0143  HGBA1C 4.2*   CBG: Recent Labs  Lab 05/10/20 0842 05/10/20 1140 05/10/20 1629 05/10/20 2109 05/11/20 0822  GLUCAP 93 122* 128* 138* 125*   Lipid Profile: No results for input(s): CHOL, HDL, LDLCALC, TRIG, CHOLHDL, LDLDIRECT in the last 72 hours. Thyroid Function Tests: No results for input(s): TSH, T4TOTAL, FREET4, T3FREE, THYROIDAB in the last 72 hours. Anemia Panel: No results for input(s): VITAMINB12, FOLATE, FERRITIN, TIBC, IRON, RETICCTPCT in the last 72 hours. Sepsis Labs: No  results for input(s): PROCALCITON, LATICACIDVEN in the last 168 hours.  Recent Results (from the past 240 hour(s))  Gram stain     Status: None   Collection Time: 05/03/20  1:10 PM   Specimen: Abdomen; Peritoneal Fluid  Result Value Ref Range Status   Specimen Description ABDOMEN  Final   Special Requests PERITONEAL FLUID  Final   Gram Stain   Final    WBC PRESENT,BOTH PMN AND MONONUCLEAR NO ORGANISMS SEEN CYTOSPIN SMEAR Performed at [facility], [address], [place], [place] [address]    Report Status 05/03/2020  FINAL  Final  Culture, body fluid-bottle     Status: None   Collection Time: 05/03/20  1:10 PM   Specimen: Peritoneal Washings  Result Value Ref Range Status   Specimen Description PERITONEAL FLUID  Final   Special Requests ABDOMEN  Final   Culture   Final    NO GROWTH 5 DAYS Performed at [facility] [address], [place], [place] [address]    Report Status 05/08/2020 FINAL  Final  Urine Culture     Status: None   Collection Time: 05/04/20  5:23 PM   Specimen: Urine, Clean Catch  Result Value Ref Range Status   Specimen Description URINE, RANDOM  Final   Special Requests NONE  Final   Culture   Final    NO GROWTH Performed at [facility], [place] [address] [place], [place] [address]    Report Status 05/05/2020 FINAL  Final  Culture, blood (routine x 2)     Status: None   Collection Time: 05/05/20  7:53 AM   Specimen: BLOOD  Result Value Ref Range Status   Specimen Description BLOOD LEFT ANTECUBITAL  Final   Special Requests   Final    BOTTLES DRAWN AEROBIC AND ANAEROBIC Blood Culture results may not be optimal due to an inadequate volume of blood received in culture bottles   Culture   Final    NO GROWTH 5 DAYS Performed at [facility], [place] [address] [place], [place] [address]    Report Status 05/10/2020 FINAL  Final  Culture, blood (routine x 2)     Status: None   Collection Time: 05/05/20  7:57 AM   Specimen: BLOOD LEFT HAND  Result Value Ref Range Status   Specimen Description BLOOD LEFT HAND  Final   Special Requests   Final    BOTTLES DRAWN AEROBIC AND ANAEROBIC Blood Culture adequate volume   Culture   Final    NO GROWTH 5 DAYS Performed at [facility], [place] [address] [place], [place] [address]    Report Status 05/10/2020 FINAL  Final  Gram stain     Status: None   Collection Time: 05/07/20 10:27 AM   Specimen: Abdomen; Peritoneal Fluid  Result Value Ref Range Status   Specimen Description PERITONEAL FLUID  Final   Special  Requests NONE  Final   Gram Stain   Final    WBC PRESENT, PREDOMINANTLY PMN NO ORGANISMS SEEN CYTOSPIN SMEAR Performed at [facility], [place] [address], [place],  [address]    Report Status 05/07/2020 FINAL  Final  Culture, body fluid-bottle     Status: None (Preliminary result)   Collection Time: 05/07/20 10:27 AM   Specimen: Peritoneal Washings  Result Value Ref Range Status   Specimen Description PERITONEAL FLUID  Final   Special Requests NONE  Final   Culture   Final  NO GROWTH 3 DAYS Performed at [facility], [place] [address], [place], [place] [address]    Report Status PENDING  Incomplete         Radiology Studies: No results found.      Scheduled Meds: . (feeding supplement) PROSource Plus  30 mL Oral TID BM  . Chlorhexidine Gluconate Cloth  6 each Topical Q0600  . feeding supplement  1 Container Oral TID BM  . folic acid  1 mg Oral Daily  . insulin aspart  0-6 Units Subcutaneous TID WC  . lactulose  20 g Oral TID  . LORazepam  0.25 mg Oral Q8H  . mouth rinse  15 mL Mouth Rinse BID  . midodrine  15 mg Oral TID WC  . pantoprazole (PROTONIX) IV  40 mg Intravenous Q12H  . prednisoLONE  30 mg Oral Q breakfast  . rifaximin  550 mg Oral BID  . sodium bicarbonate  1,300 mg Oral TID  . sodium chloride flush  10-40 mL Intracatheter Q12H  . thiamine  100 mg Oral Daily   Continuous Infusions: . sodium chloride Stopped (04/30/20 1614)  . albumin human    . ceFEPime (MAXIPIME) IV 2 g (05/11/20 0502)     LOS: 14 days    Time spent:40 min    Alya Smaltz, Geraldo Docker, [facility] Pager [phone]  If 7PM-7AM, please contact night-coverage [email or web address] Password [facility] 05/11/2020, 10:28 AM

## 2020-05-12 DIAGNOSIS — K7290 Hepatic failure, unspecified without coma: Secondary | ICD-10-CM

## 2020-05-12 LAB — COMPREHENSIVE METABOLIC PANEL
ALT: 89 U/L — ABNORMAL HIGH (ref 0–44)
AST: 114 U/L — ABNORMAL HIGH (ref 15–41)
Albumin: 1.5 g/dL — ABNORMAL LOW (ref 3.5–5.0)
Alkaline Phosphatase: 152 U/L — ABNORMAL HIGH (ref 38–126)
Anion gap: 12 (ref 5–15)
BUN: 98 mg/dL — ABNORMAL HIGH (ref 6–20)
CO2: 14 mmol/L — ABNORMAL LOW (ref 22–32)
Calcium: 7.5 mg/dL — ABNORMAL LOW (ref 8.9–10.3)
Chloride: 103 mmol/L (ref 98–111)
Creatinine, Ser: 3.61 mg/dL — ABNORMAL HIGH (ref 0.61–1.24)
GFR calc Af Amer: 22 mL/min — ABNORMAL LOW (ref >60)
GFR calc non Af Amer: 19 mL/min — ABNORMAL LOW (ref >60)
Glucose, Bld: 131 mg/dL — ABNORMAL HIGH (ref 70–99)
Potassium: 4.2 mmol/L (ref 3.5–5.1)
Sodium: 129 mmol/L — ABNORMAL LOW (ref 135–145)
Total Bilirubin: 29.1 mg/dL (ref 0.3–1.2)
Total Protein: 5.6 g/dL — ABNORMAL LOW (ref 6.5–8.1)

## 2020-05-12 LAB — CULTURE, BODY FLUID W GRAM STAIN -BOTTLE: Culture: NO GROWTH

## 2020-05-12 LAB — TRIGLYCERIDES, BODY FLUIDS: Triglycerides, Fluid: 25 mg/dL

## 2020-05-12 LAB — PATHOLOGIST SMEAR REVIEW

## 2020-05-12 LAB — GLUCOSE, CAPILLARY: Glucose-Capillary: 119 mg/dL — ABNORMAL HIGH (ref 70–99)

## 2020-05-12 LAB — PH, BODY FLUID: pH, Body Fluid: 7.4

## 2020-05-12 MED ORDER — ALUM & MAG HYDROXIDE-SIMETH 200-200-20 MG/5ML PO SUSP: 30.0000 mL | ORAL | 0 refills | Status: AC | PRN

## 2020-05-12 MED ORDER — THIAMINE HCL 100 MG PO TABS: 100.0000 mg | ORAL_TABLET | Freq: Every day | ORAL | 0 refills | Status: AC

## 2020-05-12 MED ORDER — PROCHLORPERAZINE EDISYLATE 10 MG/2ML IJ SOLN: 10.0000 mg | Freq: Four times a day (QID) | INTRAMUSCULAR | 0 refills | Status: AC | PRN

## 2020-05-12 MED ORDER — LACTULOSE 10 GM/15ML PO SOLN: 20.0000 g | Freq: Three times a day (TID) | ORAL | 0 refills | Status: AC

## 2020-05-12 MED ORDER — POLYETHYLENE GLYCOL 3350 17 G PO PACK: 17.0000 g | PACK | Freq: Every day | ORAL | 0 refills | Status: AC | PRN

## 2020-05-12 MED ORDER — LORAZEPAM 0.5 MG PO TABS: 0.5000 mg | ORAL_TABLET | Freq: Three times a day (TID) | ORAL | 0 refills | Status: AC | PRN

## 2020-05-12 MED ORDER — ONDANSETRON HCL 4 MG/2ML IJ SOLN: 4.0000 mg | Freq: Three times a day (TID) | INTRAMUSCULAR | 0 refills | Status: AC | PRN

## 2020-05-12 MED ORDER — RIFAXIMIN 550 MG PO TABS: 550.0000 mg | ORAL_TABLET | Freq: Two times a day (BID) | ORAL | 0 refills | Status: AC

## 2020-05-12 MED ORDER — SIMETHICONE 80 MG PO CHEW: 80.0000 mg | CHEWABLE_TABLET | Freq: Four times a day (QID) | ORAL | 0 refills | Status: AC | PRN

## 2020-05-12 MED ORDER — FOLIC ACID 1 MG PO TABS: 1.0000 mg | ORAL_TABLET | Freq: Every day | ORAL | 0 refills | Status: AC

## 2020-05-12 MED ORDER — HYDROMORPHONE HCL 1 MG/ML IJ SOLN: 0.5000 mg | INTRAMUSCULAR | 0 refills | Status: AC | PRN

## 2020-05-12 MED ORDER — MIDODRINE HCL 5 MG PO TABS: 15.0000 mg | ORAL_TABLET | Freq: Three times a day (TID) | ORAL | 0 refills | Status: AC

## 2020-05-12 MED ORDER — PROSOURCE PLUS PO LIQD: 30.0000 mL | Freq: Three times a day (TID) | ORAL | 0 refills | Status: AC

## 2020-05-12 MED ORDER — PREDNISOLONE 5 MG PO TABS: 30.0000 mg | ORAL_TABLET | Freq: Every day | ORAL | 0 refills | Status: AC

## 2020-05-12 MED ORDER — PANTOPRAZOLE SODIUM 40 MG IV SOLR: 40.0000 mg | Freq: Two times a day (BID) | INTRAVENOUS | 0 refills | Status: AC

## 2020-05-12 MED ORDER — SODIUM BICARBONATE 650 MG PO TABS: 1300.0000 mg | ORAL_TABLET | Freq: Three times a day (TID) | ORAL | 0 refills | Status: AC

## 2020-05-12 NOTE — Progress Notes (Signed)
Pt being D/C'd home with hospice. Pt being discharged with IV dilaudid, per MD keep 1 IV in place. Dressing changed by RN

## 2020-05-12 NOTE — Progress Notes (Signed)
Pt being transported home for hospice care by [facility]. [facility] at bed side. RN removed L hand IV and kept L forearm IV per MD instruction for pain medicine. Pt and Pt's mother given D/C education and all questions answered. Printed prescriptions placed in D/C packet.

## 2020-05-12 NOTE — Progress Notes (Signed)
[facility] Progress Note  Subjective:    No acute events, no  Labs today. Discharging home to hospice. No complaints from patient.   Vitals:   05/11/20 2012 05/11/20 2036 05/12/20 0449 05/12/20 0818  BP: (!) 88/51 (!) 90/47  (!) 92/58  Pulse: 73 61  82  Resp: 23   23  Temp: (!) 97.4 F (36.3 C) 97.8 F (36.6 C)  (!) 97.4 F (36.3 C)  TempSrc: Oral Axillary  Oral  SpO2: 100%   98%  Weight:   90.6 kg   Height:        Exam: Gen: NAD SKIN: jaundiced EYES icteric sclera ENT: MMM CV: reg rate Lungs: normal wob Abd: distended Neuro: awake, speech clear and coherent     Recent Labs  Lab 05/06/20 0500 05/07/20 0416 05/10/20 0334 05/11/20 0352  K 2.8*   < > 3.9 4.2  BUN 67*   < > 77* 98*  CREATININE 2.19*   < > 2.51* 3.61*  CALCIUM 7.7*   < > 7.8* 7.5*  PHOS 3.2  --   --  8.8*  HGB 10.8*   < > 9.8* 9.6*   < > = values in this interval not displayed.   Inpatient medications: . (feeding supplement) PROSource Plus  30 mL Oral TID BM  . Chlorhexidine Gluconate Cloth  6 each Topical Q0600  . feeding supplement  1 Container Oral TID BM  . folic acid  1 mg Oral Daily  . insulin aspart  0-6 Units Subcutaneous TID WC  . lactulose  20 g Oral TID  . mouth rinse  15 mL Mouth Rinse BID  . midodrine  15 mg Oral TID WC  . pantoprazole (PROTONIX) IV  40 mg Intravenous Q12H  . prednisoLONE  30 mg Oral Q breakfast  . rifaximin  550 mg Oral BID  . sodium bicarbonate  1,300 mg Oral TID  . sodium chloride flush  10-40 mL Intracatheter Q12H  . thiamine  100 mg Oral Daily   . sodium chloride Stopped (04/30/20 1614)   albuterol, alum & mag hydroxide-simeth, HYDROmorphone (DILAUDID) injection, lidocaine (PF), LORazepam, ondansetron (ZOFRAN) IV, polyethylene glycol, prochlorperazine, simethicone, sodium chloride flush   Assessment/Plan: 1.  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 Other possibility is bile- induced tubular injury/ ATN. Urine lytes c/w HRS, FeNa < 1%.  Also decreased oral intake, NSAIDs and recent ARB use could have contributed. - Cr rose to 2.5, was in a plateau phase, will continue to monitor for now  - weaned off levo 7/10; Sp CRRT 7/9- 7/11  - treated with midodrine, octreotide, albumin for HRS   -  Albumin with LVP's if needed in the future - removed Temp HD cath 7/17 -see yesterday's note, not a dialysis candidate, discharging home to hospice -had extensive discussion with mother  2.  Acute decompensated alcoholic hepatitis: On steroids. Rifaximin for sbp ppx  3.  Hepatic encephalopathy:  On lactulose. Per GI and TRH  4.  Hypotension, stable/improved: Cont midodrine 15mg  tid  5. Metabolic acidosis, secondary to renal injury+decreased lactate clearance from liver disease, no labs today  6. Possible bacterial peritonitis -cx's neg  7. Hyponatremia -see yesterday's note, fluid restrict  Will sign off from nephrology perspective, patient is discharging to home hospice. Please call with any questions/concerns.  , MD [facility]

## 2020-05-12 NOTE — Discharge Summary (Signed)
Physician Discharge Summary  Alan Becker JME:[id number] DOB: July 10, 1976 DOA: 04/27/2020  PCP: Janith Lima, MD  Admit date: 04/27/2020 Discharge date: 05/12/2020  Time spent: 30 minutes  Recommendations for Outpatient Follow-up: Acute Renal Failure/Hepatorenal syndrome  -Nephrology consulted and following. -Patient was a started on midodrine/octreotide/albuminduring earlier course of hospitalization. -No acute indication for dialysis, plan to monitor renal function and urine output for the next several days. -HD catheter removed 7/17  Recent Labs  Lab 05/07/20 0416 05/08/20 0347 05/09/20 1149 05/10/20 0334 05/11/20 0352  CREATININE 1.98* 2.09* 2.13* 2.51* 3.61*  -Worsening renal function -7/22 discussed case with Dr. Candiss Norse nephrology concurs that there is no place for HD for patient, in fact studies show that placing this type of patient on HD would increase his mortality.  Active decompensated EtOH liver cirrhosis -[facility] consulted. Dr. Michail Sermon saw the patient earlier during this admission. -Patient was a started on prednisone, his discriminate function was 33 -Patient deemed NOT a liver transplant candidate secondary EtOH use  -Symptomatic ascites.  Paracentesis x3 during his hospitalization.  Currently no indication for additional paracentesis.  Started on antibiotics -7/18 paracentesis fluid consistent with SBP -Lactulose 20 g TID -Prednisolone 30 mg daily -Rifaximin 550 mg BID -Pentoxifylline (held) associated bleeding risk -Viral hepatitis panel; hepatitis C antibody positive, RNA negative -7/22 LFTs stable but elevated  Results for Alan Becker, Alan Becker (MRN [id number]) as of 05/12/2020 08:56  Ref. Range 05/07/2020 04:16 05/08/2020 03:47 05/09/2020 11:49 05/10/2020 03:34 05/11/2020 03:52  Total Bilirubin Latest Ref Range: 0.3 - 1.2 mg/dL 36.8 (HH) 31.0 (HH) 30.7 (HH) 29.8 (HH) 29.1 (HH)   Acute SBP -Abdominal fluid from 7/18 ANC 750.  -Complete course of antibiotics .   -Peritoneal fluid NGTD -7/22 patient now having increased abdominal pain secondary to ascites.  Request IR therapeutic paracentesis.  Fluid sent for lab -7/22 Albumin 50 g -7/23 pleural fluid cell count increased 1090---> 4600, while on cefepime, 82% polys.  This could indicate worsening SBP although not fully convinced given that patient has no other symptomatology i.e. previous peritoneal fluid culture negative, negative fever, blood culture negative.  May be just reactive secondary to his worsening overall status and the fact he is still on high-dose steroids.  I concur with Dr. Cristina Gong from [facility] that a Pleurx cath placement would probably do more harm than good at this point in patient's disease process.    Acute respiratory failure with hypoxia -Titrate O2 to maintain SPO2 > 88%   Respiratory alkalosis -7/21 worsening.  Compensated  Leukocytosis: -Multifactorial steroids, SBP less likely no fever negative bands negative left shift.  To date peritoneal fluid and blood cultures negative -ID consulted; recommend completion of course of current antibiotics.  Hyponatremia;  -Secondary to hepatorenal syndrome -7/22 worsening hyponatremia poor prognostic indicator  GI, Bleed, melena; concern for portal gastropathy;  -Dr Cristina Gong consulted.  -Monitor patient's H/H Recent Labs  Lab 05/08/20 1548 05/09/20 0143 05/09/20 1149 05/10/20 0334 05/11/20 0352  HGB 10.0* 9.2* 9.8* 9.8* 9.6*  -Stable -Protonix IV 40 mg BID   Long-term alcohol abuse: -Folic acid 52m daily -Thiamine 100 mg daily  Abnormal liver lesion: -CT abdomen on 7/8 noted questionable focal hypoattenuation mass in the liver not well characterized, -given that patient is requesting to go home no need for further diagnostic studies.    Major depression:  -Not appropriate for psychotropic med in the setting of decompensated cirrhosis  Hypokalemia;  -Resolved  Nutrition;  -Diet 2 g sodium fluid  consistency thin -Interventions: Ensure  Enlive (each supplement provides 350kcal and 20 grams of protein), Education, Prostat  Goals of care -7/21 patient expressing wish to discontinue further treatment/diagnostic studies and go home.  Patient is going to discuss with chaplain, palliative care, and his mother and inform me of his final decision in the next 24 hours.  Patient understands that if he goes home his health will most likely rapidly deteriorate. -7/22 met with parents and palliative care discussed at length patient's current situation.  Counseled parents that no place for HD, liver transplant.  Explained at length that barring a miraculous turnaround of patient's renal function and liver function patient is terminal.    Discharge Diagnoses:  Active Problems:   Depression with anxiety   Alcohol use disorder, severe, dependence (HCC)   Hepatorenal syndrome (HCC)   AKI (acute kidney injury) ([place])   Palliative care encounter   Acute liver failure without hepatic coma   Encephalopathy acute   DNR (do not resuscitate)   Chronic hepatitis C with hepatic coma (HCC)   Abdominal distension   Abnormal transaminases   Ascites   SBP (spontaneous bacterial peritonitis) ([place])   Goals of care, counseling/discussion   Discharge Condition: Guarded  Diet recommendation: 2 g sodium  Filed Weights   05/10/20 0500 05/11/20 0500 05/12/20 0449  Weight: 91.8 kg 90.6 kg 90.6 kg    History of present illness:  44 year old WM  PMHx EtOH abuse, Hepatorenal syndrome, Hepatitis C without Hhepatic coma acute liver failure without Hepatic coma, Depression with Anxiety,  brought to the ED by family who noticed that he has been very confused and having skin color changes. He has a history of depression and has been drinking heavily for the last 8 years since his divorce. His mother gave most of the history because, patient has been confused. Mother reporter that patient has been experiencing  abdominal bloating and pain, fatigue and malaise for the last 3 to 4 weeks. Work-up in the ED consistent with acute renal failure creatinine of 8.5, albumin 1.9, bilirubin of 23, AST elevated and white count of 15. 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 On 7/9 he was a started on CRRT, GI,renal following, underwent paracentesis on 7/8 with 30 cc of dark yellow fluid which was negative for SBP. His care was [facility] to [facility] service on 7/13. He underwent repeated paracentesis on 7/14 yielding 3 L of fluid. And subsequently paracentesis on 7/18 yielding 3.8 L ascites fluids consistent with SBP. Ceftriaxone was change to cefepime. Since then his WBC has started to decline.  Patient develops worsening encephalopathy on 7/15, not following command, more lethargic. CCM consulted for further evaluation and concern for decompensation. Plan to transfer to progressive care unit, support care, NG tube placement for medicationsand nutrition. CCM recommend support care. Palliative was consulted, patient code status was change to DNR but with plan to continue with agressive care.   His liver function test over last 2 days has remain stable, with Bilirubin trending down form 34 to 30. He develops melena, maroon stool. He was started on octreotide, IV Protonix was continue. 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 Plan is for discontinuation of octreotide. He required transiently after endoscopy neo synephrine.   Hospital Course:  See above  Procedures: 7/8 paracentesis 30 cc of dark yellow fluid aspirated 7/8 central venous catheter placed 7/9 central venous catheter placed 7/14 paracentesis 3 L bright yellow fluid aspirated  7/18 paracentesis 3.8 L bright yellow fluid  aspirated 7/20 EGD;-normal esophagus. - 2 cm hiatal hernia. - Portal hypertensive gastropathy. This is the likely source for the patient's recent bleeding. 7/22  paracentesis 2.2 L bright yellow fluid aspirated   Consultations: GI ID   Cultures  7/8 SARS coronavirus negative 7/16 blood LEFT AC negative final 7/16 blood LEFT hand negative final 7/18 peritoneal fluid NGTD   Antibiotics Anti-infectives (From admission, onward)   Start     Dose/Rate Route Frequency Ordered Stop             05/04/20 1045  rifaximin (XIFAXAN) tablet 550 mg     Discontinue     550 mg Oral 2 times daily 05/04/20 0930     05/03/20 1345  cefTRIAXone (ROCEPHIN) 2 g in sodium chloride 0.9 % 100 mL IVPB  Status:  Discontinued        2 g 200 mL/hr over 30 Minutes Intravenous Every 24 hours 05/03/20 1336 05/07/20 1335   04/28/20 1600  cefTRIAXone (ROCEPHIN) 2 g in sodium chloride 0.9 % 100 mL IVPB  Status:  Discontinued        2 g 200 mL/hr over 30 Minutes Intravenous Every 24 hours 04/27/20 1731 04/29/20 1635   04/27/20 1500  cefTRIAXone (ROCEPHIN) 2 g in sodium chloride 0.9 % 100 mL IVPB        2 g 200 mL/hr over 30 Minutes Intravenous  Once 04/27/20 1449 04/27/20 1726        Discharge Exam: Vitals:   05/11/20 2012 05/11/20 2036 05/12/20 0449 05/12/20 0818  BP: (!) 88/51 (!) 90/47  (!) 92/58  Pulse: 73 61  82  Resp: 23   23  Temp: (!) 97.4 F (36.3 C) 97.8 F (36.6 C)  (!) 97.4 F (36.3 C)  TempSrc: Oral Axillary  Oral  SpO2: 100%   98%  Weight:   90.6 kg   Height:        General: A/O x2 (does not know where, when), does know he is in the hospital, No acute respiratory distress Eyes: negative scleral hemorrhage, negative anisocoria, positive i icterus ENT: Negative Runny nose, negative gingival bleeding, Neck:  Negative scars, masses, torticollis, lymphadenopathy, JVD Lungs: Clear to auscultation bilaterally without wheezes or crackles Cardiovascular: Regular rate and rhythm without murmur gallop or rub normal S1 and S2 Abdomen: negative abdominal pain, positive distention,  positive soft, bowel sounds, no rebound, positive  ascites, no appreciable  mass    Discharge Instructions   Allergies as of 05/12/2020      Reactions   Penicillins Other (See Comments)   Did it involve swelling of the face/tongue/throat, SOB, or low BP? No Did it involve sudden or severe rash/hives, skin peeling, or any reaction on the inside of your mouth or nose? Rash Did you need to seek medical attention at a hospital or doctor's office? Yes When did it last happen?Child If all above answers are "NO", may proceed with cephalosporin use.      Medication List    STOP taking these medications   Cholecalciferol 50 MCG (2000 UT) Tabs   GABAPENTIN PO   irbesartan 300 MG tablet Commonly known as: AVAPRO   LIVER PO   temazepam 7.5 MG capsule Commonly known as: RESTORIL   thiamine 100 MG tablet Commonly known as: Vitamin B-1 Replaced by: thiamine 100 MG tablet   traZODone 150 MG tablet Commonly known as: DESYREL   TURMERIC PO   Viibryd Starter Pack 10 & 20 MG Kit Generic drug: Vilazodone HCl  vitamin B-12 100 MCG tablet Commonly known as: CYANOCOBALAMIN     TAKE these medications   (feeding supplement) PROSource Plus liquid Take 30 mLs by mouth 3 (three) times daily between meals.   acetaminophen 500 MG tablet Commonly known as: TYLENOL Take 500 mg by mouth every 6 (six) hours as needed for moderate pain.   alum & mag hydroxide-simeth 200-200-20 MG/5ML suspension Commonly known as: MAALOX/MYLANTA Take 30 mLs by mouth every 4 (four) hours as needed for indigestion or heartburn.   folic acid 1 MG tablet Commonly known as: FOLVITE Take 1 tablet (1 mg total) by mouth daily. Start taking on: May 13, 2020   HYDROmorphone 1 MG/ML injection Commonly known as: DILAUDID Inject 0.5-1 mLs (0.5-1 mg total) into the vein every 3 (three) hours as needed for moderate pain.   ibuprofen 200 MG tablet Commonly known as: ADVIL Take 200 mg by mouth every 6 (six) hours as needed for moderate pain.   lactulose 10 GM/15ML  solution Commonly known as: CHRONULAC Take 30 mLs (20 g total) by mouth 3 (three) times daily.   LORazepam 0.5 MG tablet Commonly known as: ATIVAN Take 1 tablet (0.5 mg total) by mouth every 8 (eight) hours as needed for anxiety.   midodrine 5 MG tablet Commonly known as: PROAMATINE Take 3 tablets (15 mg total) by mouth 3 (three) times daily with meals.   ondansetron 4 MG/2ML Soln injection Commonly known as: ZOFRAN Inject 2 mLs (4 mg total) into the vein every 8 (eight) hours as needed for nausea or vomiting.   pantoprazole 40 MG injection Commonly known as: PROTONIX Inject 40 mg into the vein every 12 (twelve) hours.   polyethylene glycol 17 g packet Commonly known as: MIRALAX / GLYCOLAX Take 17 g by mouth daily as needed for moderate constipation.   prednisoLONE 5 MG Tabs tablet Take 6 tablets (30 mg total) by mouth daily with breakfast. Start taking on: May 13, 2020   prochlorperazine 10 MG/2ML injection Commonly known as: COMPAZINE Inject 2 mLs (10 mg total) into the vein every 6 (six) hours as needed.   rifaximin 550 MG Tabs tablet Commonly known as: XIFAXAN Take 1 tablet (550 mg total) by mouth 2 (two) times daily.   simethicone 80 MG chewable tablet Commonly known as: MYLICON Chew 1 tablet (80 mg total) by mouth 4 (four) times daily as needed for flatulence.   sodium bicarbonate 650 MG tablet Take 2 tablets (1,300 mg total) by mouth 3 (three) times daily.   thiamine 100 MG tablet Take 1 tablet (100 mg total) by mouth daily. Start taking on: May 13, 2020 Replaces: thiamine 100 MG tablet   ZINC PO Take 1 tablet by mouth daily.      Allergies  Allergen Reactions  . Penicillins Other (See Comments)    Did it involve swelling of the face/tongue/throat, SOB, or low BP? No Did it involve sudden or severe rash/hives, skin peeling, or any reaction on the inside of your mouth or nose? Rash Did you need to seek medical attention at a hospital or doctor's  office? Yes When did it last happen?Child If all above answers are "NO", may proceed with cephalosporin use.      Follow-up Information    [facility] Follow up.   Specialty: PALLIATIVE CARE Why: referral made for home hospice needs Contact information: [address] [place] [id number] [phone]  The results of significant diagnostics from this hospitalization (including imaging, microbiology, ancillary and laboratory) are listed below for reference.    Significant Diagnostic Studies: CT ABDOMEN PELVIS WO CONTRAST  Result Date: 04/27/2020 CLINICAL DATA:  Acute, nonlocalized abdominal pain, jaundice EXAM: CT ABDOMEN AND PELVIS WITHOUT CONTRAST TECHNIQUE: Multidetector CT imaging of the abdomen and pelvis was performed following the standard protocol without IV contrast. COMPARISON:  None. FINDINGS: Lower chest: Mild right basilar atelectasis. Moderate coronary artery calcification. Global cardiac size within normal limits. Hepatobiliary: The liver is moderately enlarged and is diffusely heterogeneously markedly hypoechoic, a finding that can be seen with acute and chronic hepatic steatosis. A more focal 3.3 x 2.2 cm hypoattenuating lesion within segment 4A is indeterminate, not well assessed on this noncontrast examination. There is no definite intrahepatic biliary ductal dilation. The gallbladder is hyperdense suggesting either vicarious excretion of contrast from a prior examination or gallbladder sludge, however, the gallbladder is not distended and no definite pericholecystic inflammatory changes are identified. There is moderate ascites noted. Pancreas: Unremarkable Spleen: Unremarkable Adrenals/Urinary Tract: Unremarkable Stomach/Bowel: The stomach is unremarkable. Large and small bowel are unremarkable. Appendix normal. No free intraperitoneal gas. Vascular/Lymphatic: The abdominal vasculature is age-appropriate on this  noncontrast examination. A single pathologically enlarged periportal lymph node is seen at axial image 42/2 measuring 2.6 by 1.9 cm no other pathologic adenopathy within the abdomen and pelvis. Reproductive: Unremarkable Other: None significant Musculoskeletal: Unremarkable IMPRESSION: Hepatomegaly, parenchymal changes, and moderate ascites. These findings can be seen in chronic hepatic steatohepatitis as well as acute fatty disease of the liver which has a variety of possible causes including acute infectious hepatitis, drug toxicity, and alcoholic hepatitis, among other etiologies. Focal hypoattenuating mass within segment 4 a of the liver is not well characterized on this noncontrast examination and could be better assessed with dedicated liver protocol CT or MRI examination once the patient's acute issues have resolved. Pathologic periportal adenopathy is nonspecific and may be simply reactive given the pathologic process within the liver. Electronically Signed   By: Fidela Salisbury MD   On: 04/27/2020 16:30   DG Chest 2 View  Result Date: 05/04/2020 CLINICAL DATA:  Cough. EXAM: CHEST - 2 VIEW COMPARISON:  April 28, 2020. FINDINGS: The heart size and mediastinal contours are within normal limits. Feeding tube is seen entering stomach. Right internal jugular catheter is unchanged. No pneumothorax is noted. Hypoinflation of the lungs is noted with mild bibasilar subsegmental atelectasis. No significant pleural effusion is noted. The visualized skeletal structures are unremarkable. IMPRESSION: Hypoinflation of the lungs with mild bibasilar subsegmental atelectasis. Electronically Signed   By: Marijo Conception M.D.   On: 05/04/2020 13:58   DG Abd 1 View  Result Date: 05/04/2020 CLINICAL DATA:  Feeding tube placement EXAM: ABDOMEN - 1 VIEW COMPARISON:  None. FINDINGS: Feeding tube placed in the Radiology department. Documenting film shows soft feeding tube tip at the ligament of Treitz. Contrast injected for  confirmation. IMPRESSION: Soft feeding tube tip at the ligament of Treitz. Electronically Signed   By: Nelson Chimes M.D.   On: 05/04/2020 13:59   CT Head Wo Contrast  Result Date: 04/27/2020 CLINICAL DATA:  Altered mental status, jaundice EXAM: CT HEAD WITHOUT CONTRAST TECHNIQUE: Contiguous axial images were obtained from the base of the skull through the vertex without intravenous contrast. COMPARISON:  None. FINDINGS: Brain: Normal anatomic configuration. No abnormal intra or extra-axial mass lesion or fluid collection. No abnormal mass effect or midline shift. No evidence of acute intracranial  hemorrhage or infarct. Ventricular size is normal. Cerebellum unremarkable. Vascular: Unremarkable Skull: Intact Sinuses/Orbits: Paranasal sinuses are clear. Orbits are unremarkable. Other: Mastoid air cells and middle ear cavities are clear. IMPRESSION: Normal exam Electronically Signed   By: Fidela Salisbury MD   On: 04/27/2020 15:18   US Paracentesis  Result Date: 05/07/2020 INDICATION: Ascites EXAM: ULTRASOUND GUIDED LLQ PARACENTESIS MEDICATIONS: 10 cc 1% lidocaine COMPLICATIONS: None immediate. PROCEDURE: Informed written consent was obtained from the patient after a discussion of the risks, benefits and alternatives to treatment. A timeout was performed prior to the initiation of the procedure. Initial ultrasound scanning demonstrates a large amount of ascites within the left lower abdominal quadrant. The left lower abdomen was prepped and draped in the usual sterile fashion. 1% lidocaine was used for local anesthesia. Following this, a 25 G Yueh catheter was introduced. An ultrasound image was saved for documentation purposes. The paracentesis was performed. The catheter was removed and a dressing was applied. The patient tolerated the procedure well without immediate post procedural complication. Patient received post-procedure intravenous albumin; see nursing notes for details. FINDINGS: A total of  approximately 3.8 liters of bright yellow fluid was removed. Samples were sent to the laboratory as requested by the clinical team. IMPRESSION: Successful ultrasound-guided paracentesis yielding 3.8 L liters of peritoneal fluid. Read by Lavonia Drafts [facility] Electronically Signed   By: Corrie Mckusick D.O.   On: 05/07/2020 10:27   DG Chest Port 1 View  Result Date: 04/28/2020 CLINICAL DATA:  Status post central line placement. EXAM: PORTABLE CHEST 1 VIEW COMPARISON:  April 27, 2020. FINDINGS: The heart size and mediastinal contours are within normal limits. Left subclavian catheter line is unchanged in position. Interval placement of right internal jugular catheter with distal tip in expected position of cavoatrial junction. No pneumothorax or significant pleural effusion is noted. Hypoinflation of the lungs is noted with mild bibasilar subsegmental atelectasis. The visualized skeletal structures are unremarkable. IMPRESSION: Interval placement of right internal jugular catheter with distal tip in expected position of cavoatrial junction. No pneumothorax is noted. Hypoinflation of the lungs with mild bibasilar subsegmental atelectasis. Electronically Signed   By: Marijo Conception M.D.   On: 04/28/2020 14:37   DG Chest Port 1 View  Result Date: 04/27/2020 CLINICAL DATA:  Central line placement EXAM: PORTABLE CHEST 1 VIEW COMPARISON:  Radiograph 04/27/2020 FINDINGS: A left subclavian central venous catheter tip terminates near the superior cavoatrial junction. Lung volumes are further diminished from the prior exam with streaky opacities in the bases favoring atelectasis. Stable cardiomediastinal contours. No pneumothorax or visible effusion. No acute osseous or soft tissue abnormality. Telemetry leads overlie the chest. Nasal cannula projects over the upper chest as well. IMPRESSION: Left subclavian catheter tip at the superior cavoatrial junction. No pneumothorax. Low lung volumes with streaky opacities in the right  lung base likely atelectasis though infection is not excluded. Electronically Signed   By: Lovena Le M.D.   On: 04/27/2020 23:55   DG Chest Portable 1 View  Result Date: 04/27/2020 CLINICAL DATA:  transported pt from home to [facility] ED and reports the following" Pt drank around one fifth of gin daily for 3 weeks then stopped. Starting 2.5 weeks ago pt began feeling sick. Has been drinking water and Pedialyte. Urine has been a.*comment was truncated*weakness EXAM: PORTABLE CHEST 1 VIEW COMPARISON:  None. FINDINGS: Normal cardiac silhouette. There is low lung volumes. Elevation of the RIGHT hemidiaphragm. There is bibasilar atelectasis. Cannot exclude infiltrate at the RIGHT  lung base. No pulmonary edema. No pneumothorax. IMPRESSION: RIGHT lower lobe atelectasis versus infiltrate. Electronically Signed   By: Suzy Bouchard M.D.   On: 04/27/2020 15:10   US Abdomen Limited RUQ  Result Date: 05/04/2020 CLINICAL DATA:  Abnormal transaminase levels. EXAM: ULTRASOUND ABDOMEN LIMITED RIGHT UPPER QUADRANT COMPARISON:  April 27, 2020. FINDINGS: Gallbladder: No gallstones or wall thickening visualized. No sonographic Murphy sign noted by sonographer. Small amount of sludge is noted within the gallbladder lumen. Common bile duct: Diameter: 3 mm which is within normal limits. Liver: No definite focal lesion identified. Increased echogenicity of hepatic parenchyma is noted suggesting hepatic steatosis. Portal vein is patent on color Doppler imaging with normal direction of blood flow towards the liver. Other: Minimal ascites is noted. IMPRESSION: Small amount of gallbladder sludge. Increased echogenicity of hepatic parenchyma is noted most consistent with hepatic steatosis. Minimal ascites. Electronically Signed   By: Marijo Conception M.D.   On: 05/04/2020 10:57   IR Paracentesis  Result Date: 05/11/2020 INDICATION: Patient with end-stage liver disease, recurrent ascites. Request is made for diagnostic and therapeutic  paracentesis. EXAM: ULTRASOUND GUIDED DIAGNOSTIC AND THERAPEUTIC PARACENTESIS MEDICATIONS: 10 mL 1% lidocaine COMPLICATIONS: None immediate. PROCEDURE: Informed written consent was obtained from the patient after a discussion of the risks, benefits and alternatives to treatment. A timeout was performed prior to the initiation of the procedure. Initial ultrasound scanning demonstrates a moderate amount of ascites within the right lower abdominal quadrant. The right lower abdomen was prepped and draped in the usual sterile fashion. 1% lidocaine was used for local anesthesia. Following this, a 19 gauge, 7-cm, Yueh catheter was introduced. An ultrasound image was saved for documentation purposes. The paracentesis was performed. The catheter was removed and a dressing was applied. The patient tolerated the procedure well without immediate post procedural complication. FINDINGS: A total of approximately 2.2 liters of bright yellow fluid was removed. Samples were sent to the laboratory as requested by the clinical team. IMPRESSION: Successful ultrasound-guided diagnostic and therapeutic paracentesis yielding 2.2 liters of peritoneal fluid. Read by: Brynda Greathouse PA-C Electronically Signed   By: Corrie Mckusick D.O.   On: 05/11/2020 16:22   IR Paracentesis  Result Date: 05/03/2020 INDICATION: Patient with acute decompensated alcoholic liver cirrhosis, hepatic renal syndrome, ascites. Request IR for diagnostic and therapeutic paracentesis with 3-4 L maximum. EXAM: ULTRASOUND GUIDED DIAGNOSTIC AND THERAPEUTIC PARACENTESIS MEDICATIONS: 10 mL 1% lidocaine COMPLICATIONS: None immediate. PROCEDURE: Informed written consent was obtained from the patient after a discussion of the risks, benefits and alternatives to treatment. A timeout was performed prior to the initiation of the procedure. Initial ultrasound scanning demonstrates a large amount of ascites within the right lower abdominal quadrant. The right lower abdomen was  prepped and draped in the usual sterile fashion. 1% lidocaine was used for local anesthesia. Following this, a 6 Fr Safe-T-Centesis catheter was introduced. An ultrasound image was saved for documentation purposes. The paracentesis was performed. The catheter was removed and a dressing was applied. The patient tolerated the procedure well without immediate post procedural complication. Patient received post-procedure intravenous albumin; see nursing notes for details. FINDINGS: A total of approximately 3.0 L of bright yellow fluid was removed. Samples were sent to the laboratory as requested by the clinical team. IMPRESSION: Successful ultrasound-guided paracentesis yielding 3.0 liters of peritoneal fluid. Read by Candiss Norse, PA-C Electronically Signed   By: Markus Daft M.D.   On: 05/03/2020 10:46    Microbiology: Recent Results (from the past 240 hour(s))  Gram stain     Status: None   Collection Time: 05/03/20  1:10 PM   Specimen: Abdomen; Peritoneal Fluid  Result Value Ref Range Status   Specimen Description ABDOMEN  Final   Special Requests PERITONEAL FLUID  Final   Gram Stain   Final    WBC PRESENT,BOTH PMN AND MONONUCLEAR NO ORGANISMS SEEN CYTOSPIN SMEAR Performed at [facility], [address], [place], [place] [address]    Report Status 05/03/2020 FINAL  Final  Culture, body fluid-bottle     Status: None   Collection Time: 05/03/20  1:10 PM   Specimen: Peritoneal Washings  Result Value Ref Range Status   Specimen Description PERITONEAL FLUID  Final   Special Requests ABDOMEN  Final   Culture   Final    NO GROWTH 5 DAYS Performed at [place] [address], [place], [place] [address]    Report Status 05/08/2020 FINAL  Final  Urine Culture     Status: None   Collection Time: 05/04/20  5:23 PM   Specimen: Urine, Clean Catch  Result Value Ref Range Status   Specimen Description URINE, RANDOM  Final   Special Requests NONE  Final   Culture   Final    NO  GROWTH Performed at [facility], [place] [address] [place], [place] [address]    Report Status 05/05/2020 FINAL  Final  Culture, blood (routine x 2)     Status: None   Collection Time: 05/05/20  7:53 AM   Specimen: BLOOD  Result Value Ref Range Status   Specimen Description BLOOD LEFT ANTECUBITAL  Final   Special Requests   Final    BOTTLES DRAWN AEROBIC AND ANAEROBIC Blood Culture results may not be optimal due to an inadequate volume of blood received in culture bottles   Culture   Final    NO GROWTH 5 DAYS Performed at [facility], [place] [address] [place], [place] [address]    Report Status 05/10/2020 FINAL  Final  Culture, blood (routine x 2)     Status: None   Collection Time: 05/05/20  7:57 AM   Specimen: BLOOD LEFT HAND  Result Value Ref Range Status   Specimen Description BLOOD LEFT HAND  Final   Special Requests   Final    BOTTLES DRAWN AEROBIC AND ANAEROBIC Blood Culture adequate volume   Culture   Final    NO GROWTH 5 DAYS Performed at [facility], [place] [address], [place], [place] [address]    Report Status 05/10/2020 FINAL  Final  Gram stain     Status: None   Collection Time: 05/07/20 10:27 AM   Specimen: Abdomen; Peritoneal Fluid  Result Value Ref Range Status   Specimen Description PERITONEAL FLUID  Final   Special Requests NONE  Final   Gram Stain   Final    WBC PRESENT, PREDOMINANTLY PMN NO ORGANISMS SEEN CYTOSPIN SMEAR Performed at [facility], [place] [address] [place], [place] [address]    Report Status 05/07/2020 FINAL  Final  Culture, body fluid-bottle     Status: None   Collection Time: 05/07/20 10:27 AM   Specimen: Peritoneal Washings  Result Value Ref Range Status   Specimen Description PERITONEAL FLUID  Final   Special Requests NONE  Final   Culture   Final    NO GROWTH 5 DAYS Performed at [place] [address] [place], [place] [address]  Report Status 05/12/2020 FINAL  Final  Gram stain     Status:  None   Collection Time: 05/11/20  4:48 PM   Specimen: Abdomen; Peritoneal Fluid  Result Value Ref Range Status   Specimen Description PLEURAL  Final   Special Requests NONE  Final   Gram Stain   Final    ABUNDANT WBC PRESENT, PREDOMINANTLY PMN NO ORGANISMS SEEN Performed at [facility], [place] [address], [place], [place] [address]    Report Status 05/11/2020 FINAL  Final  Culture, body fluid-bottle     Status: None (Preliminary result)   Collection Time: 05/11/20  4:48 PM   Specimen: Pleura  Result Value Ref Range Status   Specimen Description PLEURAL  Final   Special Requests NONE  Final   Culture   Final    NO GROWTH < 24 HOURS Performed at [facility], [place] [address] [place], [place] [address]    Report Status PENDING  Incomplete     Labs: Basic Metabolic Panel: Recent Labs  Lab 05/06/20 0500 05/06/20 0500 05/07/20 0416 05/08/20 0347 05/09/20 1149 05/10/20 0334 05/11/20 0352  NA 142   < > 140 137 132* 130* 129*  K 2.8*   < > 3.9 4.2 3.8 3.9 4.2  CL 110   < > 111 108 106 102 103  CO2 19*   < > 18* 19* 17* 15* 14*  GLUCOSE 138*   < > 165* 138* 119* 94 131*  BUN 67*   < > 65* 76* 73* 77* 98*  CREATININE 2.19*   < > 1.98* 2.09* 2.13* 2.51* 3.61*  CALCIUM 7.7*   < > 7.8* 7.7* 7.7* 7.8* 7.5*  MG 2.3  --   --   --   --   --  2.1  PHOS 3.2  --   --   --   --   --  8.8*   < > = values in this interval not displayed.   Liver Function Tests: Recent Labs  Lab 05/07/20 0416 05/08/20 0347 05/09/20 1149 05/10/20 0334 05/11/20 0352  AST 160* 170* 158* 170* 114*  ALT 86* 92* 108* 102* 89*  ALKPHOS 161* 129* 188* 162* 152*  BILITOT 36.8* 31.0* 30.7* 29.8* 29.1*  PROT 6.1* 5.6* 5.5* 5.6* 5.6*  ALBUMIN 2.0* 2.2* 1.9* 1.7* 1.5*   No results for input(s): LIPASE, AMYLASE in the last 168 hours. Recent Labs  Lab 05/06/20 0500  AMMONIA 62*   CBC: Recent Labs  Lab 05/07/20 1815 05/07/20 2227 05/08/20 0347 05/08/20 0943 05/08/20 1548 05/09/20 0143  05/09/20 1149 05/10/20 0334 05/11/20 0352  WBC 40.1*  --  35.3*  --   --   --  31.8* 27.7* 26.8*  NEUTROABS  --   --   --   --   --   --   --   --  23.5*  HGB 9.6*   < > 8.6*   < > 10.0* 9.2* 9.8* 9.8* 9.6*  HCT 27.5*   < > 24.3*   < > 28.1* 25.9* 27.8* 27.5* 26.6*  MCV 105.4*  --  106.1*  --   --   --  107.3* 106.6* 106.0*  PLT 125*  --  131*  --   --   --  181 180 191   < > = values in this interval not displayed.   Cardiac Enzymes: No results for input(s): CKTOTAL, CKMB, CKMBINDEX, TROPONINI in the last 168 hours. BNP: BNP (last 3 results)  No results for input(s): BNP in the last 8760 hours.  ProBNP (last 3 results) No results for input(s): PROBNP in the last 8760 hours.  CBG: Recent Labs  Lab 05/10/20 1629 05/10/20 2109 05/11/20 0822 05/11/20 1157 05/12/20 0848  GLUCAP 128* 138* 125* 128* 119*       Signed:  Dia Crawford, MD [facility] [phone] pager

## 2020-05-12 NOTE — Progress Notes (Signed)
Occupational Therapy Treatment Patient Details Name: Alan Becker MRN: [id number] DOB: 1975-12-05 Today's Date: 05/12/2020    History of present illness 44 year old with past medical history significant for alcohol abuse who was brought to the ED by family who noticed that he has been very confused and having skin color changes.Work-up in the ED consistent with acute renal failure.  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  On 7/9 he was a started on CRRT, GI renal following, underwent paracentesis on 7/8 with 30 cc of dark yellow fluid which was negative for SBP.  His care was transferred to [facility] service on 7/13.  He underwent repeated paracentesis on 7/14 results pending.   OT comments  Noted pt with d/c order and new plan to go home with [facility] hospice. Mother present in room and requesting education on pt care. OT reviewed safe bed positioning, using pads, and how to help transfer to EOB. Also reviewed safe transfers and how much assist to give pt. Educated mother on use of [facility] for pt to take a shower and to use at EOB to reduce falls. Gave pt mother a urinal to take home to assist in incontinence care for pt. Reviewed how to help in peri care and skin integrity for incontinence. Recommended w/c (pt mother to talk to hospice representative) and offered w/c education and safety. Mother feels all her questions are answered at this time. Anticipate pt to d/c with [facility] this afternoon.   Follow Up Recommendations  Other (comment) (Family has decided home on hospice)    Equipment Recommendations  3 in 1 bedside commode;Wheelchair (measurements OT);Wheelchair cushion (measurements OT);Hospital bed    Recommendations for Other Services      Precautions / Restrictions Precautions Precautions: Fall Restrictions Weight Bearing Restrictions: No       Mobility Bed Mobility               General bed mobility comments: educated pts mother on  using bed pad for positioning in bed and how to safely position pt in bed to protect skin integrity  Transfers                 General transfer comment: educated pts mother on safe handling techniques for bed, toilet, chair transfers    Balance                                           ADL either performed or assessed with clinical judgement   ADL                                         General ADL Comments: Session focused on safe BADL education to mother to promote safe care at home. Reviewed dressing, bathing, toileting, clean up in bed if incontinent, and DME usage.     Vision Baseline Vision/History: Wears glasses Wears Glasses: At all times Patient Visual Report: No change from baseline     Perception     Praxis      Cognition Arousal/Alertness: Awake/alert Behavior During Therapy: Flat affect Overall Cognitive Status: Impaired/Different from baseline  General Comments: difficult to assess this date, pt with minimal arousal and very lethargic due to ativan dose. Session focused on family education for adequate care at home        Exercises     Shoulder Instructions       General Comments      Pertinent Vitals/ Pain       Pain Assessment: No/denies pain  Home Living                                          Prior Functioning/Environment              Frequency  Min 2X/week        Progress Toward Goals  OT Goals(current goals can now be found in the care plan section)  Progress towards OT goals: Progressing toward goals  Acute Rehab OT Goals Patient Stated Goal: go home OT Goal Formulation: With patient/family Time For Goal Achievement: 05/18/20 Potential to Achieve Goals: Fair  Plan Discharge plan needs to be updated    Co-evaluation                 AM-PAC OT "6 Clicks" Daily Activity     Outcome Measure   Help from  another person eating meals?: A Little Help from another person taking care of personal grooming?: A Little Help from another person toileting, which includes using toliet, bedpan, or urinal?: A Lot Help from another person bathing (including washing, rinsing, drying)?: A Lot Help from another person to put on and taking off regular upper body clothing?: A Lot Help from another person to put on and taking off regular lower body clothing?: Total 6 Click Score: 13    End of Session    OT Visit Diagnosis: Unsteadiness on feet (R26.81);Muscle weakness (generalized) (M62.81);Other symptoms and signs involving cognitive function   Activity Tolerance Patient limited by fatigue   Patient Left in bed;with call bell/phone within reach;with bed alarm set   Nurse Communication Mobility status        Time: 0539-7673 OT Time Calculation (min): 12 min  Charges: OT General Charges $OT Visit: 1 Visit OT Treatments $Self Care/Home Management : 8-22 mins  Dalphine Handing, MSOT, OTR/L Acute Rehabilitation Services [facility] Office Number: [phone] Pager: [phone]  Dalphine Handing 05/12/2020, 10:49 AM

## 2020-05-12 NOTE — Progress Notes (Signed)
Patient had 2.2 L paracentesis this morning.  Cell count is markedly elevated on the peritoneal fluid, rising from 1100 when checked 5 days ago to a level of 4600 (82% polys) when checked yesterday, despite being on cefepime in the meantime.  This suggests there may be some sort of underlying GI tract lesion going on, although no bowel problem was identified on his CT scan from 2 weeks ago.  Plans for hospice noted; accordingly, no new labs have been obtained this morning.  I think this is reasonable since the patient's prognosis is very grave.  The only other question is whether a Pleurx catheter should be inserted prior to discharge, for management of ascitic fluid for terminal care palliation.  It is unclear how long the patient would survive at home and whether there would be time for significant ascitic fluid accumulation to occur, which could lead to pain that would otherwise then have to be treated with analgesics.  On the other hand, if the peritoneal fluid is possibly infected, insertion of a foreign body could exacerbate that problem.  On balance, I think it is probably reasonable to do without the catheter.  I will plan to see the patient and talk to the patient's mother later this morning.  Florencia Reasons, M.D. Pager [phone] If no answer or after 5 PM call [phone]

## 2020-05-12 NOTE — TOC Transition Note (Signed)
Transition of Care (TOC) - CM/SW Discharge Note Donn Pierini RN,BSN Transitions of Care Unit 4NP (non trauma) - RN Case Manager See Treatment Team for direct Phone #   Patient Details  Name: Alan Becker MRN: [id number] Date of Birth: 09-26-1976  Transition of Care [facility]) CM/SW Contact:  Darrold Span, RN Phone Number: 05/12/2020, 10:28 AM   Clinical Narrative:    Pt stable for transition home today with home hospice, have spoken with Cheri at [facility] and have confirmed services. Hospice will plan on see pt for hospice admission later this afternoon in the home. DME has been arranged.  GOLD DNR has been signed.  [facility] called for transport home- paperwork placed on shadow chart- per [facility] no wait ahead of pt they will be in transport shortly. Cheri with Hospice notified that transport has been called.    Final next level of care: Home w Hospice Care Barriers to Discharge: Barriers Resolved   Patient Goals and CMS Choice Patient states their goals for this hospitalization and ongoing recovery are:: pt wants to go home CMS Medicare.gov Compare Post Acute Care list provided to:: Patient Choice offered to / list presented to : Patient, Parent  Discharge Placement               Home with Hospice.         Discharge Plan and Services In-house Referral: Hospice / Palliative Care Discharge Planning Services: CM Consult Post Acute Care Choice: Hospice          DME Arranged: Walker rolling         HH Arranged: Disease Management (Hospice) HH Agency: [facility] Date [facility] Agency Contacted: 05/11/20 Time HH Agency Contacted: 1625 Representative spoke with at [facility] Agency: Cheri  Social Determinants of Health (SDOH) Interventions     Readmission Risk Interventions Readmission Risk Prevention Plan 05/12/2020  Transportation Screening Complete  PCP or Specialist Appt within 5-7 Days Complete  Home Care Screening Complete  Medication Review  (RN CM) Complete  Some recent data might be hidden

## 2020-05-14 LAB — TOTAL BILIRUBIN, BODY FLUID: Total bilirubin, fluid: 5.3 mg/dL

## 2020-05-16 LAB — CULTURE, BODY FLUID W GRAM STAIN -BOTTLE: Culture: NO GROWTH

## 2020-05-21 DEATH — deceased

## 2020-06-08 LAB — FUNGUS CULTURE WITH STAIN

## 2020-06-08 LAB — FUNGUS CULTURE RESULT

## 2020-06-08 LAB — FUNGAL ORGANISM REFLEX

## 2021-11-15 IMAGING — CT CT ABD-PELV W/O CM
2 of 4 series · 15 of 46 positions shown, 17 images · non-contrast
Comparison: None.

CLINICAL DATA: Acute, nonlocalized abdominal pain, jaundice

EXAM:
CT ABDOMEN AND PELVIS WITHOUT CONTRAST
TECHNIQUE: Multidetector CT imaging of the abdomen and pelvis was performed
following the standard protocol without IV contrast.

[Series 2: axial st · axial · 0.85mm/px · z∈[-538,-18]mm · 12 of 120 slices shown, 14 images]
[im 8/120  soft-tissue]
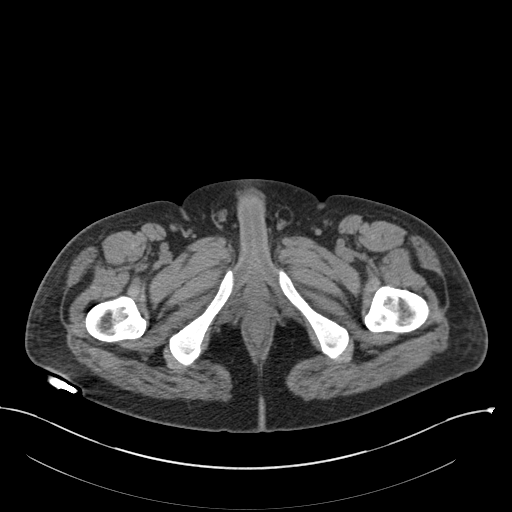
[im 8/120  bone]
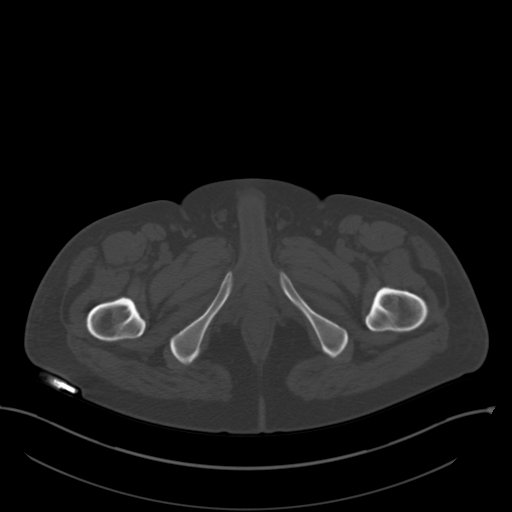
[im 15/120  soft-tissue]
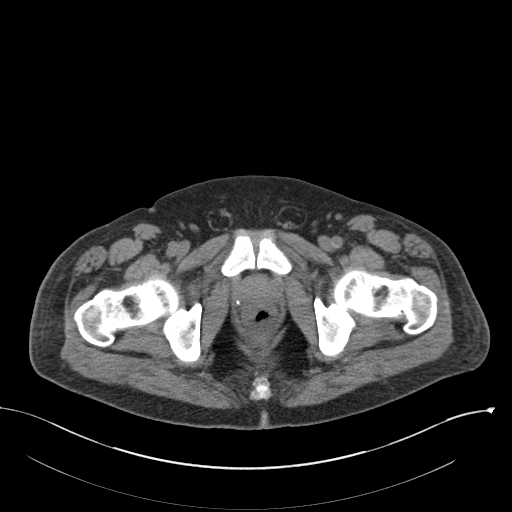
[im 30/120  soft-tissue]
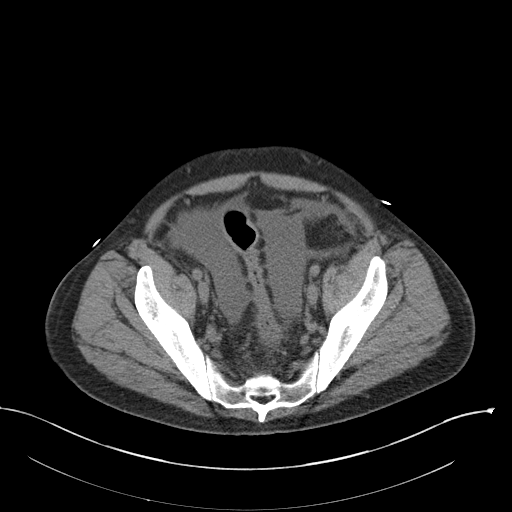
[im 38/120  soft-tissue]
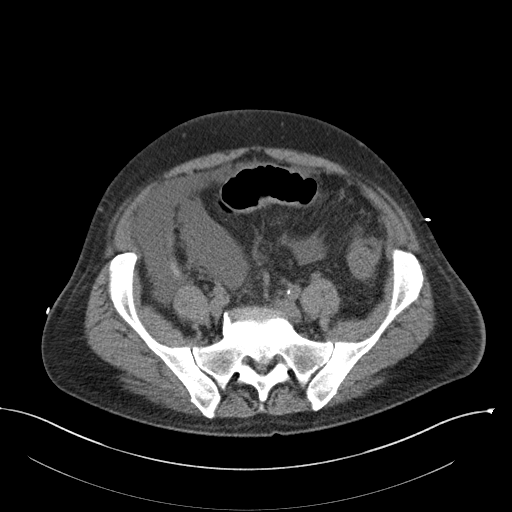
[im 45/120  soft-tissue]
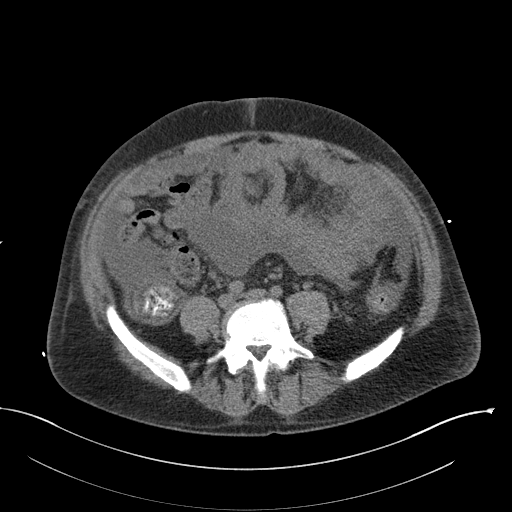
[im 53/120  soft-tissue]
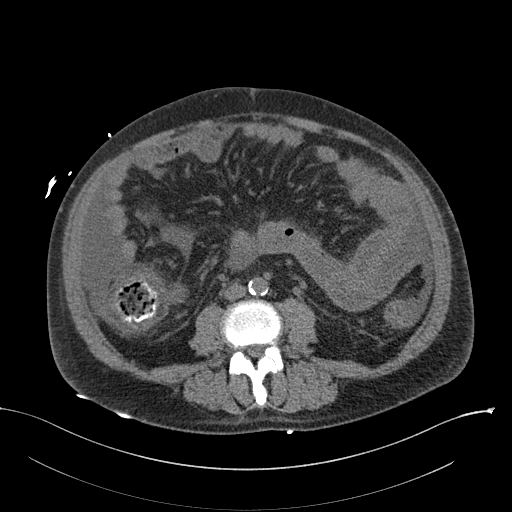
[im 67/120  soft-tissue]
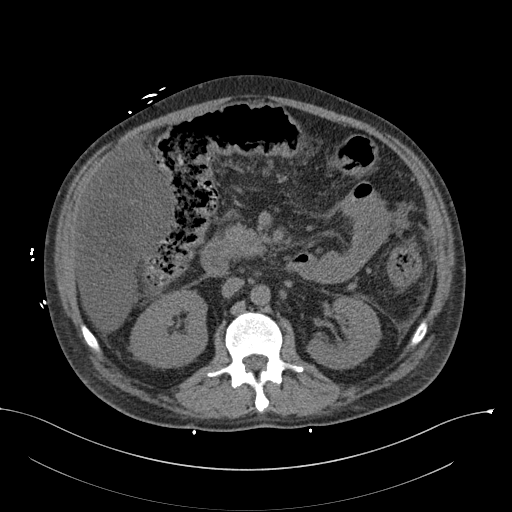
[im 75/120  soft-tissue]
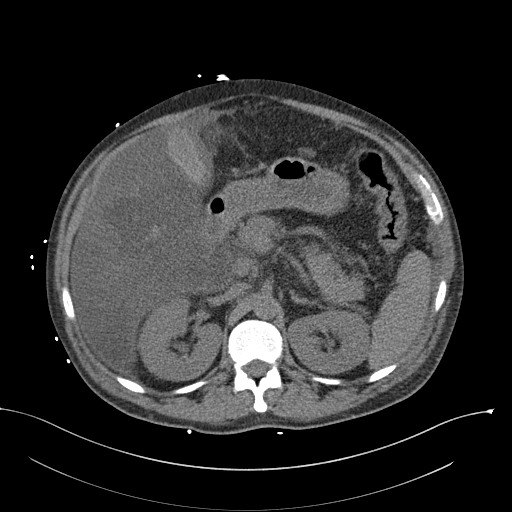
[im 82/120  soft-tissue]
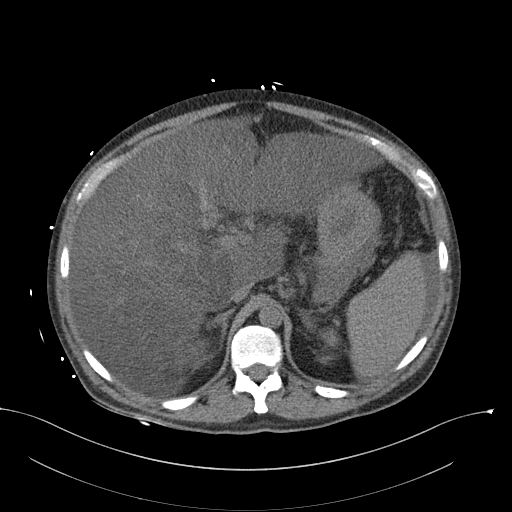
[im 82/120  bone]
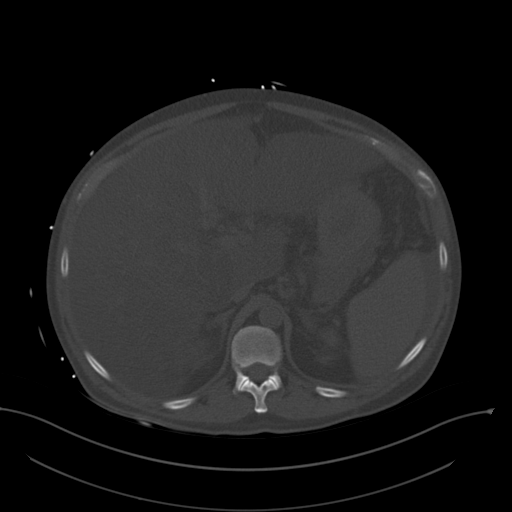
[im 90/120  soft-tissue]
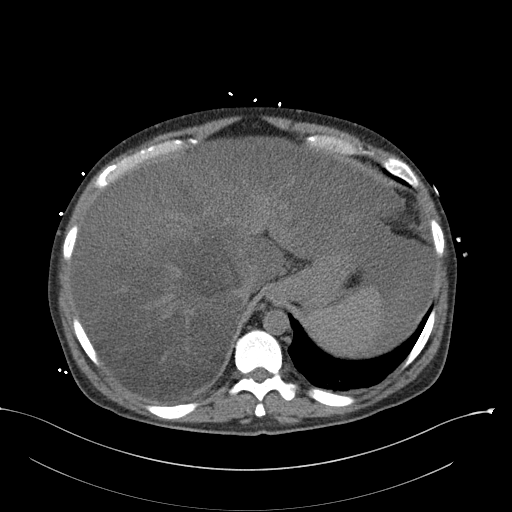
[im 105/120  soft-tissue]
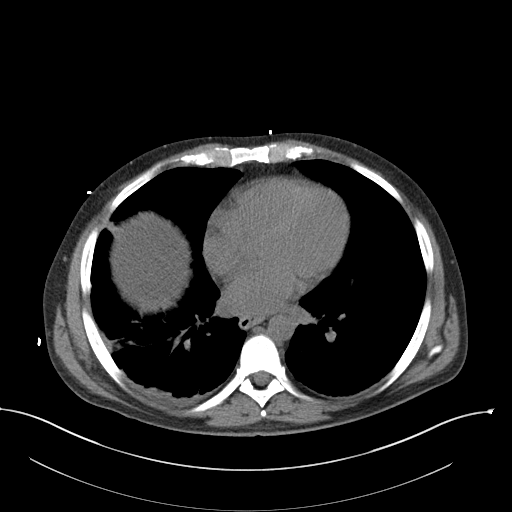
[im 112/120  soft-tissue]
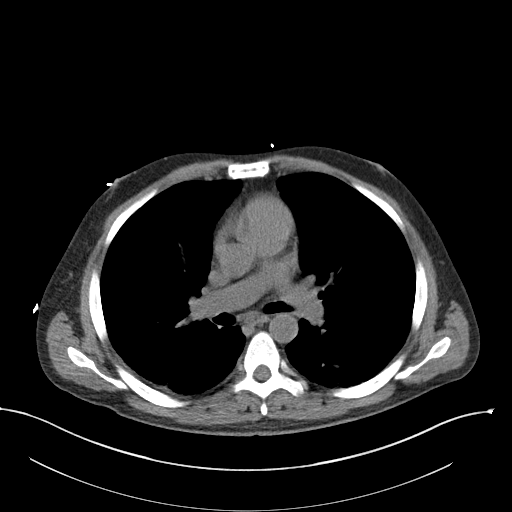

[Series 4: coronal st · coronal · 0.76mm/px · 3 of 151 slices shown]
[im 51/151  soft-tissue]
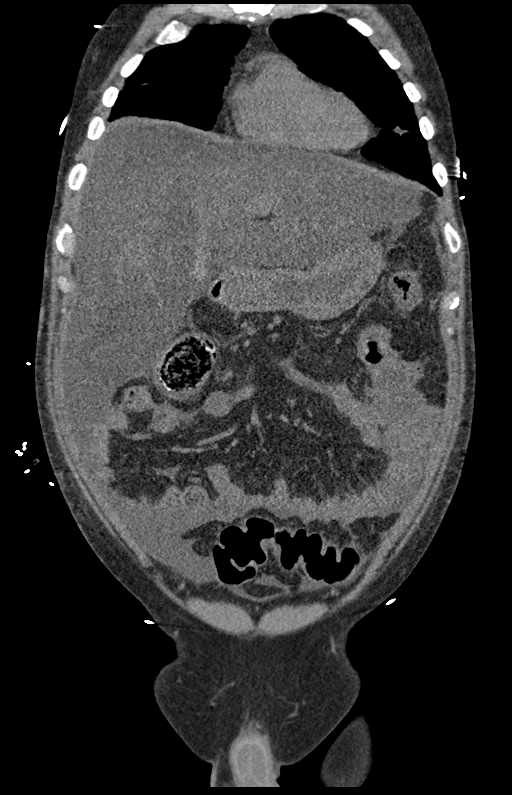
[im 67/151  soft-tissue]
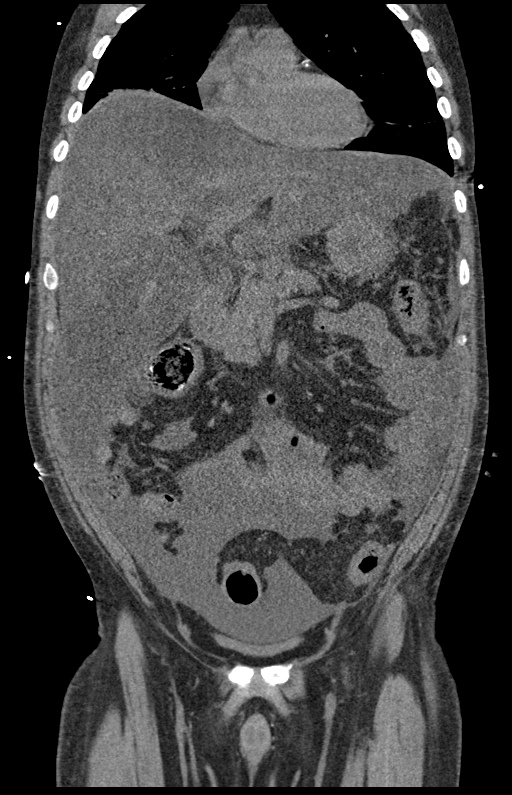
[im 84/151  soft-tissue]
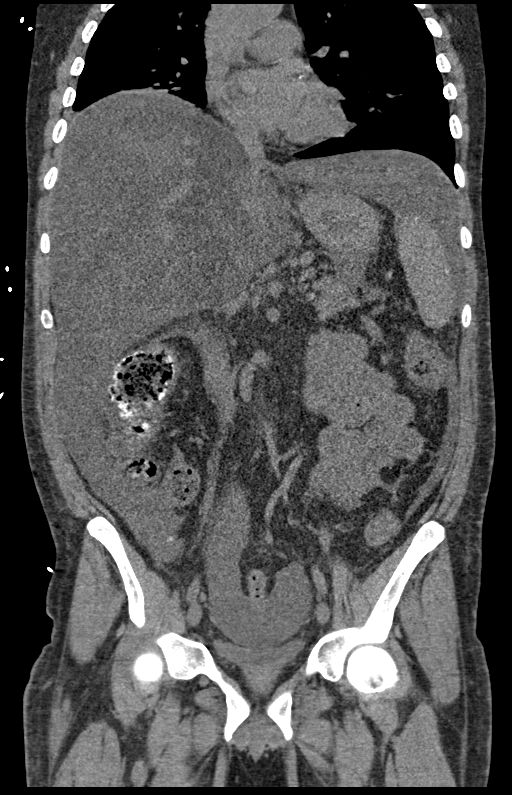

[15 of 46 positions shown; findings below may reference images not displayed]

FINDINGS: Lower chest: Mild right basilar atelectasis. Moderate coronary
artery calcification. Global cardiac size within normal limits.

Hepatobiliary: The liver is moderately enlarged and is diffusely
heterogeneously markedly hypoechoic, a finding that can be seen with
acute and chronic hepatic steatosis. A more focal 3.3 x 2.2 cm
hypoattenuating lesion within segment 4A is indeterminate, not well
assessed on this noncontrast examination. There is no definite
intrahepatic biliary ductal dilation. The gallbladder is hyperdense
suggesting either vicarious excretion of contrast from a prior
examination or gallbladder sludge, however, the gallbladder is not
distended and no definite pericholecystic inflammatory changes are
identified. There is moderate ascites noted.

Pancreas: Unremarkable

Spleen: Unremarkable

Adrenals/Urinary Tract: Unremarkable

Stomach/Bowel: The stomach is unremarkable. Large and small bowel
are unremarkable. Appendix normal. No free intraperitoneal gas.

Vascular/Lymphatic: The abdominal vasculature is age-appropriate on
this noncontrast examination. A single pathologically enlarged
periportal lymph node is seen at axial image 42/2 measuring 2.6 by
1.9 cm no other pathologic adenopathy within the abdomen and pelvis.

Reproductive: Unremarkable

Other: None significant

Musculoskeletal: Unremarkable
IMPRESSION: Hepatomegaly, parenchymal changes, and moderate ascites. These
findings can be seen in chronic hepatic steatohepatitis as well as
acute fatty disease of the liver which has a variety of possible
causes including acute infectious hepatitis, drug toxicity, and
alcoholic hepatitis, among other etiologies. Focal hypoattenuating
mass within segment 4 a of the liver is not well characterized on
this noncontrast examination and could be better assessed with
dedicated liver protocol CT or MRI examination once the patient's
acute issues have resolved. Pathologic periportal adenopathy is
nonspecific and may be simply reactive given the pathologic process
within the liver.
